# Patient Record
Sex: Female | Born: 1955
Health system: Southern US, Community
[De-identification: ages and names within clinical notes are randomized; demographics above are authoritative.]

## PROBLEM LIST (undated history)

## (undated) DIAGNOSIS — E079 Disorder of thyroid, unspecified: Secondary | ICD-10-CM

## (undated) DIAGNOSIS — F419 Anxiety disorder, unspecified: Secondary | ICD-10-CM

## (undated) DIAGNOSIS — K760 Fatty (change of) liver, not elsewhere classified: Secondary | ICD-10-CM

## (undated) DIAGNOSIS — H35039 Hypertensive retinopathy, unspecified eye: Secondary | ICD-10-CM

## (undated) DIAGNOSIS — F32A Depression, unspecified: Secondary | ICD-10-CM

## (undated) DIAGNOSIS — G4700 Insomnia, unspecified: Secondary | ICD-10-CM

## (undated) DIAGNOSIS — J45909 Unspecified asthma, uncomplicated: Secondary | ICD-10-CM

## (undated) DIAGNOSIS — F329 Major depressive disorder, single episode, unspecified: Secondary | ICD-10-CM

## (undated) DIAGNOSIS — M8580 Other specified disorders of bone density and structure, unspecified site: Secondary | ICD-10-CM

## (undated) DIAGNOSIS — R7303 Prediabetes: Secondary | ICD-10-CM

## (undated) DIAGNOSIS — L309 Dermatitis, unspecified: Secondary | ICD-10-CM

## (undated) DIAGNOSIS — T7840XA Allergy, unspecified, initial encounter: Secondary | ICD-10-CM

## (undated) DIAGNOSIS — I700 Atherosclerosis of aorta: Secondary | ICD-10-CM

## (undated) DIAGNOSIS — E669 Obesity, unspecified: Secondary | ICD-10-CM

## (undated) DIAGNOSIS — M1990 Unspecified osteoarthritis, unspecified site: Secondary | ICD-10-CM

## (undated) DIAGNOSIS — E785 Hyperlipidemia, unspecified: Secondary | ICD-10-CM

## (undated) DIAGNOSIS — N289 Disorder of kidney and ureter, unspecified: Secondary | ICD-10-CM

## (undated) DIAGNOSIS — E559 Vitamin D deficiency, unspecified: Secondary | ICD-10-CM

## (undated) DIAGNOSIS — R011 Cardiac murmur, unspecified: Secondary | ICD-10-CM

## (undated) DIAGNOSIS — K219 Gastro-esophageal reflux disease without esophagitis: Secondary | ICD-10-CM

## (undated) DIAGNOSIS — E039 Hypothyroidism, unspecified: Secondary | ICD-10-CM

## (undated) DIAGNOSIS — H409 Unspecified glaucoma: Secondary | ICD-10-CM

## (undated) DIAGNOSIS — I10 Essential (primary) hypertension: Secondary | ICD-10-CM

## (undated) DIAGNOSIS — J382 Nodules of vocal cords: Secondary | ICD-10-CM

## (undated) DIAGNOSIS — L719 Rosacea, unspecified: Secondary | ICD-10-CM

## (undated) DIAGNOSIS — E0500 Thyrotoxicosis with diffuse goiter without thyrotoxic crisis or storm: Secondary | ICD-10-CM

## (undated) DIAGNOSIS — N644 Mastodynia: Secondary | ICD-10-CM

## (undated) DIAGNOSIS — Z1231 Encounter for screening mammogram for malignant neoplasm of breast: Secondary | ICD-10-CM

## (undated) DIAGNOSIS — Z13220 Encounter for screening for lipoid disorders: Secondary | ICD-10-CM

## (undated) HISTORY — DX: Fatty (change of) liver, not elsewhere classified: K76.0

## (undated) HISTORY — DX: Thyrotoxicosis with diffuse goiter without thyrotoxic crisis or storm: E05.00

## (undated) HISTORY — DX: Essential (primary) hypertension: I10

## (undated) HISTORY — DX: Rosacea, unspecified: L71.9

## (undated) HISTORY — DX: Atherosclerosis of aorta: I70.0

## (undated) HISTORY — DX: Unspecified osteoarthritis, unspecified site: M19.90

## (undated) HISTORY — DX: Anxiety disorder, unspecified: F41.9

## (undated) HISTORY — DX: Allergy, unspecified, initial encounter: T78.40XA

## (undated) HISTORY — DX: Disorder of thyroid, unspecified: E07.9

## (undated) HISTORY — DX: Unspecified glaucoma: H40.9

## (undated) HISTORY — DX: Obesity, unspecified: E66.9

## (undated) HISTORY — DX: Insomnia, unspecified: G47.00

## (undated) HISTORY — DX: Disorder of kidney and ureter, unspecified: N28.9

## (undated) HISTORY — DX: Gastro-esophageal reflux disease without esophagitis: K21.9

## (undated) HISTORY — DX: Hyperlipidemia, unspecified: E78.5

## (undated) HISTORY — DX: Cardiac murmur, unspecified: R01.1

## (undated) HISTORY — DX: Other specified disorders of bone density and structure, unspecified site: M85.80

## (undated) HISTORY — DX: Dermatitis, unspecified: L30.9

## (undated) HISTORY — DX: Vitamin D deficiency, unspecified: E55.9

## (undated) HISTORY — DX: Hypertensive retinopathy, unspecified eye: H35.039

## (undated) HISTORY — DX: Nodules of vocal cords: J38.2

## (undated) HISTORY — DX: Unspecified asthma, uncomplicated: J45.909

## (undated) HISTORY — DX: Major depressive disorder, single episode, unspecified: F32.9

## (undated) HISTORY — DX: Depression, unspecified: F32.A

## (undated) HISTORY — DX: Prediabetes: R73.03

---
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
## 1998-09-29 ENCOUNTER — Other Ambulatory Visit: Admission: RE | Admit: 1998-09-29 | Discharge: 1998-09-29 | Payer: Self-pay | Admitting: *Deleted

## 1999-11-09 ENCOUNTER — Other Ambulatory Visit: Admission: RE | Admit: 1999-11-09 | Discharge: 1999-11-09 | Payer: Self-pay | Admitting: *Deleted

## 1999-11-17 ENCOUNTER — Encounter: Payer: Self-pay | Admitting: *Deleted

## 1999-11-17 ENCOUNTER — Encounter: Admission: RE | Admit: 1999-11-17 | Discharge: 1999-11-17 | Payer: Self-pay | Admitting: *Deleted

## 2001-01-03 ENCOUNTER — Other Ambulatory Visit: Admission: RE | Admit: 2001-01-03 | Discharge: 2001-01-03 | Payer: Self-pay | Admitting: Obstetrics and Gynecology

## 2001-01-05 ENCOUNTER — Encounter: Payer: Self-pay | Admitting: Obstetrics and Gynecology

## 2001-01-05 ENCOUNTER — Encounter: Admission: RE | Admit: 2001-01-05 | Discharge: 2001-01-05 | Payer: Self-pay | Admitting: Obstetrics and Gynecology

## 2002-01-02 ENCOUNTER — Other Ambulatory Visit: Admission: RE | Admit: 2002-01-02 | Discharge: 2002-01-02 | Payer: Self-pay | Admitting: *Deleted

## 2002-01-10 ENCOUNTER — Encounter: Payer: Self-pay | Admitting: *Deleted

## 2002-01-10 ENCOUNTER — Ambulatory Visit (HOSPITAL_COMMUNITY): Admission: RE | Admit: 2002-01-10 | Discharge: 2002-01-10 | Payer: Self-pay | Admitting: *Deleted

## 2003-02-05 ENCOUNTER — Other Ambulatory Visit: Admission: RE | Admit: 2003-02-05 | Discharge: 2003-02-05 | Payer: Self-pay

## 2003-02-12 ENCOUNTER — Ambulatory Visit (HOSPITAL_COMMUNITY): Admission: RE | Admit: 2003-02-12 | Discharge: 2003-02-12 | Payer: Self-pay

## 2003-08-26 ENCOUNTER — Encounter: Admission: RE | Admit: 2003-08-26 | Discharge: 2003-08-26 | Payer: Self-pay

## 2003-11-12 ENCOUNTER — Ambulatory Visit (HOSPITAL_COMMUNITY): Admission: RE | Admit: 2003-11-12 | Discharge: 2003-11-12 | Payer: Self-pay | Admitting: Family Medicine

## 2004-02-05 ENCOUNTER — Other Ambulatory Visit: Admission: RE | Admit: 2004-02-05 | Discharge: 2004-02-05 | Payer: Self-pay | Admitting: Family Medicine

## 2004-02-17 ENCOUNTER — Ambulatory Visit (HOSPITAL_COMMUNITY): Admission: RE | Admit: 2004-02-17 | Discharge: 2004-02-17 | Payer: Self-pay | Admitting: Family Medicine

## 2005-02-23 ENCOUNTER — Ambulatory Visit (HOSPITAL_COMMUNITY): Admission: RE | Admit: 2005-02-23 | Discharge: 2005-02-23 | Payer: Self-pay | Admitting: Family Medicine

## 2005-03-07 ENCOUNTER — Other Ambulatory Visit: Admission: RE | Admit: 2005-03-07 | Discharge: 2005-03-07 | Payer: Self-pay | Admitting: Family Medicine

## 2006-03-01 ENCOUNTER — Ambulatory Visit (HOSPITAL_COMMUNITY): Admission: RE | Admit: 2006-03-01 | Discharge: 2006-03-01 | Payer: Self-pay | Admitting: Family Medicine

## 2006-03-24 ENCOUNTER — Encounter: Admission: RE | Admit: 2006-03-24 | Discharge: 2006-03-24 | Payer: Self-pay | Admitting: Family Medicine

## 2006-07-26 ENCOUNTER — Ambulatory Visit: Payer: Self-pay | Admitting: Internal Medicine

## 2006-08-02 ENCOUNTER — Ambulatory Visit: Payer: Self-pay | Admitting: Internal Medicine

## 2007-03-07 ENCOUNTER — Ambulatory Visit (HOSPITAL_COMMUNITY): Admission: RE | Admit: 2007-03-07 | Discharge: 2007-03-07 | Payer: Self-pay | Admitting: Family Medicine

## 2008-03-17 ENCOUNTER — Ambulatory Visit (HOSPITAL_COMMUNITY): Admission: RE | Admit: 2008-03-17 | Discharge: 2008-03-17 | Payer: Self-pay | Admitting: *Deleted

## 2008-03-25 ENCOUNTER — Ambulatory Visit: Payer: Self-pay | Admitting: Internal Medicine

## 2008-04-22 ENCOUNTER — Ambulatory Visit: Payer: Self-pay | Admitting: Internal Medicine

## 2009-03-19 ENCOUNTER — Ambulatory Visit (HOSPITAL_COMMUNITY): Admission: RE | Admit: 2009-03-19 | Discharge: 2009-03-19 | Payer: Self-pay | Admitting: Family Medicine

## 2009-05-07 ENCOUNTER — Encounter: Admission: RE | Admit: 2009-05-07 | Discharge: 2009-08-05 | Payer: Self-pay | Admitting: Family Medicine

## 2009-08-05 ENCOUNTER — Encounter: Admission: RE | Admit: 2009-08-05 | Discharge: 2009-09-30 | Payer: Self-pay | Admitting: Family Medicine

## 2010-03-31 ENCOUNTER — Ambulatory Visit (HOSPITAL_COMMUNITY): Admission: RE | Admit: 2010-03-31 | Discharge: 2010-03-31 | Payer: Self-pay | Admitting: Family Medicine

## 2011-03-18 NOTE — Assessment & Plan Note (Signed)
[facility]                           GASTROENTEROLOGY OFFICE NOTE   Morgan Williams, Morgan Williams                       MRN:          [id number]  DATE:07/26/2006                            DOB:          Oct 15, 1956    REFERRING PHYSICIAN:  Talmadge Williams, M.D.   REASON FOR CONSULTATION:  Reflux disease.   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 Smith Williams regarding reflux disease.  Patient reports approximately two years ago developing problems with  hoarseness.  This in the absence of heartburn or indigestion.  She was  evaluated by Dr. Zola Button T. Wolicki (ear, nose and throat specialist).  According to the patient she underwent laryngoscopy and a local CT scan.  She was apparently found to have benign vocal cord nodules which were felt  secondary to reflux disease.  She was prescribed Prilosec 20 mg b.i.d. and  her hoarseness resolved over the course of 3-4 months.  She has continued on  medication at that dosage.  She has had no change in lifestyle or weight.  However, about 3 months ago, her hoarseness returned.  At this time, there  was associated pyrosis which was particularly bothersome at night.  She does  return home from her shift in the evening and generally eats late at night  prior to going to bed.  She treated herself by increasing her evening  Prilosec dose to 40 mg.  As well, she was taking antacids regularly.  Despite this, symptoms persisted.  She subsequently saw Dr. Smith Williams and  was changed to Nexium 40 mg b.i.d.  This appointment made.  She has been on  Nexium 40 mg b.i.d. for about one month.  Her symptoms are markedly  improved. She has rare episodes of pyrosis.  Her hoarseness is nearly  resolved.   REVIEW OF SYSTEMS:  Some intermittent solid food dysphagia which is a new  problem over the past several months.  She will experience symptoms several  times per  week.  No difficulty with liquids.  Occasional gas and bloating.  No abdominal pain, change in bowel habits or rectal bleeding. She does  undergo annual Hemoccult testing which has been negative.   PAST MEDICAL HISTORY:  1. Hypertension.  2. Asthma.  3. Hypothyroidism.  4. Osteoarthritis.  5. Reflux disease as described above.   PAST SURGICAL HISTORY:  Thyroidectomy.   ALLERGIES:  SULFA.   CURRENT MEDICATIONS:  1. Nexium 40 mg b.i.d.  2. Levothyroxine 88 mcg daily.  3. Hydrochlorothiazide 12.5 mg daily.  4. Celebrex 200 mg b.i.d.  5. Benicar 20/12.5 mg daily.  6. She also uses p.r.n. medications including Advair, Zyrtec, Ambien,      albuterol, MetroLotion and triamcinolone cream, Tussionex and Xanax.   FAMILY HISTORY:  Father with colon polyps.  Mother with ulcerative colitis.   SOCIAL HISTORY:  Patient is divorced with two children.  She lives with her  daughter.  She has a BS in pharmacy and works as a Teacher, early years/pre for [employer].  She  does not smoke and rarely uses  alcohol.   REVIEW OF SYSTEMS:  Per diagnostic evaluation form.   PHYSICAL EXAMINATION:  GENERAL APPEARANCE:  A well-appearing female in no  acute distress.  VITAL SIGNS:  Blood pressure 122/62, heart rate 62 and regular, weight 210.6  pounds.  She is 5 feet 3 inches in height.  HEENT:  Sclerae are anicteric.  Conjunctivae are pink.  Oral mucosa intact.  Posterior pharynx is unremarkable.  There is no adenopathy.  LUNGS:  Clear.  CARDIOVASCULAR:  Regular.  ABDOMEN:  Soft without tenderness, mass or hernia.  Good bowel sounds heard.  EXTREMITIES:  Without edema.   IMPRESSION:  Gastroesophageal reflux disease with laryngeal and esophageal  symptoms.  As well, there are some problems with dysphagia.  Rule out peptic  stricture.  Rule out Barrett's esophagus.   RECOMMENDATIONS:  1. Continue Nexium 40 mg p.o. b.i.d.  2. Reflux precautions.  3. Schedule upper endoscopy with possible esophageal dilatation.  Ashby Dawes       of the procedure as well as risks, benefits, and alternatives have been      reviewed.  She understood and agreed to proceed.  4. Ongoing general medical care with Dr. Smith Williams.            ______________________________  Wilhemina Bonito. Eda Keys., MD      JNP/MedQ  DD:  07/26/2006  DT:  07/28/2006  Job #:  [id number]

## 2011-09-07 ENCOUNTER — Other Ambulatory Visit (HOSPITAL_COMMUNITY): Payer: Self-pay | Admitting: Family Medicine

## 2011-09-07 DIAGNOSIS — Z1231 Encounter for screening mammogram for malignant neoplasm of breast: Secondary | ICD-10-CM

## 2011-10-05 ENCOUNTER — Ambulatory Visit (HOSPITAL_COMMUNITY)
Admission: RE | Admit: 2011-10-05 | Discharge: 2011-10-05 | Disposition: A | Discharge status: Home / Self Care | Payer: BC Managed Care – PPO | Source: Ambulatory Visit | Attending: Family Medicine | Admitting: Family Medicine

## 2011-10-05 DIAGNOSIS — Z1231 Encounter for screening mammogram for malignant neoplasm of breast: Secondary | ICD-10-CM | POA: Insufficient documentation

## 2012-10-26 ENCOUNTER — Other Ambulatory Visit (HOSPITAL_COMMUNITY): Payer: Self-pay | Admitting: Family Medicine

## 2012-10-26 DIAGNOSIS — Z1231 Encounter for screening mammogram for malignant neoplasm of breast: Secondary | ICD-10-CM

## 2012-11-06 ENCOUNTER — Other Ambulatory Visit (HOSPITAL_COMMUNITY)
Admission: RE | Admit: 2012-11-06 | Discharge: 2012-11-06 | Disposition: A | Discharge status: Home / Self Care | Payer: BC Managed Care – PPO | Source: Ambulatory Visit | Attending: Family Medicine | Admitting: Family Medicine

## 2012-11-06 ENCOUNTER — Other Ambulatory Visit: Payer: Self-pay | Admitting: Family Medicine

## 2012-11-06 DIAGNOSIS — Z124 Encounter for screening for malignant neoplasm of cervix: Secondary | ICD-10-CM | POA: Insufficient documentation

## 2012-11-13 ENCOUNTER — Ambulatory Visit (HOSPITAL_COMMUNITY)
Admission: RE | Admit: 2012-11-13 | Discharge: 2012-11-13 | Disposition: A | Discharge status: Home / Self Care | Payer: BC Managed Care – PPO | Source: Ambulatory Visit | Attending: Family Medicine | Admitting: Family Medicine

## 2012-11-13 DIAGNOSIS — Z1231 Encounter for screening mammogram for malignant neoplasm of breast: Secondary | ICD-10-CM | POA: Insufficient documentation

## 2013-11-08 ENCOUNTER — Other Ambulatory Visit (HOSPITAL_COMMUNITY): Payer: Self-pay | Admitting: Family Medicine

## 2013-11-08 DIAGNOSIS — Z1231 Encounter for screening mammogram for malignant neoplasm of breast: Secondary | ICD-10-CM

## 2013-11-20 ENCOUNTER — Ambulatory Visit (HOSPITAL_COMMUNITY): Payer: BC Managed Care – PPO

## 2013-11-21 ENCOUNTER — Ambulatory Visit (HOSPITAL_COMMUNITY)
Admission: RE | Admit: 2013-11-21 | Discharge: 2013-11-21 | Disposition: A | Discharge status: Home / Self Care | Payer: BC Managed Care – PPO | Source: Ambulatory Visit | Attending: Family Medicine | Admitting: Family Medicine

## 2013-11-21 DIAGNOSIS — Z1231 Encounter for screening mammogram for malignant neoplasm of breast: Secondary | ICD-10-CM | POA: Insufficient documentation

## 2014-11-17 ENCOUNTER — Other Ambulatory Visit (HOSPITAL_COMMUNITY): Payer: Self-pay | Admitting: Family Medicine

## 2014-11-17 DIAGNOSIS — Z1231 Encounter for screening mammogram for malignant neoplasm of breast: Secondary | ICD-10-CM

## 2014-11-24 ENCOUNTER — Ambulatory Visit (HOSPITAL_COMMUNITY)
Admission: RE | Admit: 2014-11-24 | Discharge: 2014-11-24 | Disposition: A | Discharge status: Home / Self Care | Payer: 59 | Source: Ambulatory Visit | Attending: Family Medicine | Admitting: Family Medicine

## 2014-11-24 DIAGNOSIS — Z1231 Encounter for screening mammogram for malignant neoplasm of breast: Secondary | ICD-10-CM | POA: Diagnosis not present

## 2015-05-12 ENCOUNTER — Ambulatory Visit (INDEPENDENT_AMBULATORY_CARE_PROVIDER_SITE_OTHER): Payer: 59 | Admitting: Physician Assistant

## 2015-05-12 VITALS — BP 130/80 | HR 85 | Temp 98.4°F | Resp 16 | Ht 63.0 in | Wt 224.4 lb

## 2015-05-12 DIAGNOSIS — S90851A Superficial foreign body, right foot, initial encounter: Secondary | ICD-10-CM | POA: Diagnosis not present

## 2015-05-12 DIAGNOSIS — S90859A Superficial foreign body, unspecified foot, initial encounter: Secondary | ICD-10-CM

## 2015-05-12 MED ORDER — MUPIROCIN 2 % EX OINT: 1.0000 "application " | TOPICAL_OINTMENT | Freq: Two times a day (BID) | CUTANEOUS | Status: DC

## 2015-05-12 NOTE — Progress Notes (Signed)
   Subjective:    Patient ID: Morgan Williams, female    DOB: Sep 14, 1956, 59 y.o.   MRN: [id number]  HPI Patient presents with glass in right foot following stepping on shattered light 3 days ago. Wash foot well, however, has felt like something is in her foot since then. Patient's son has tried to get glass out unsuccessfully. Denies pain, but is uncomfortable. Denies fever, drainage, and numbness. Has tried putting cold tar on foot to get glass out. Med allergy to sulfa antibiotics.    Review of Systems  Constitutional: Negative for fever and chills.  Musculoskeletal: Positive for gait problem.  Skin: Positive for wound. Negative for color change.  Neurological: Negative for numbness.       Objective:   Physical Exam  Constitutional: She is oriented to person, place, and time. She appears well-developed and well-nourished. No distress.  Blood pressure 130/80, pulse 85, temperature 98.4 F (36.9 C), temperature source Oral, resp. rate 16, height 5\' 3"  (1.6 m), weight 224 lb 6.4 oz (101.787 kg), SpO2 96 %.   HENT:  Head: Normocephalic and atraumatic.  Right Ear: External ear normal.  Left Ear: External ear normal.  Eyes: Conjunctivae are normal. Right eye exhibits no discharge. Left eye exhibits no discharge. No scleral icterus.  Pulmonary/Chest: Effort normal.  Neurological: She is alert and oriented to person, place, and time.  Skin: Skin is warm and dry. No rash noted. She is not diaphoretic. No erythema. No pallor.  Psychiatric: She has a normal mood and affect. Her behavior is normal. Judgment and thought content normal.   Procedure Consent obtained. 1/2 cc 1% lido local anesthesia. Trimmed back dead skin. Small incision made. Glass removed. Wound irrigated. Mupirocin gel and clean dressing placed.      Assessment & Plan:  1. Foreign body in foot, unspecified laterality, initial encounter Care instructions discussed. - mupirocin ointment (BACTROBAN) 2 %; Apply 1 application  topically 2 (two) times daily.  Dispense: 15 g; Refill: 1   Damilola Flamm PA-C  [facility] 05/12/2015 5:48 PM

## 2015-05-25 ENCOUNTER — Encounter: Payer: Self-pay | Admitting: Internal Medicine

## 2015-11-23 ENCOUNTER — Other Ambulatory Visit (HOSPITAL_COMMUNITY)
Admission: RE | Admit: 2015-11-23 | Discharge: 2015-11-23 | Disposition: A | Discharge status: Home / Self Care | Payer: 59 | Source: Ambulatory Visit | Attending: Family Medicine | Admitting: Family Medicine

## 2015-11-23 ENCOUNTER — Other Ambulatory Visit: Payer: Self-pay | Admitting: Family Medicine

## 2015-11-23 ENCOUNTER — Other Ambulatory Visit: Payer: Self-pay

## 2015-11-23 DIAGNOSIS — E559 Vitamin D deficiency, unspecified: Secondary | ICD-10-CM | POA: Diagnosis not present

## 2015-11-23 DIAGNOSIS — E039 Hypothyroidism, unspecified: Secondary | ICD-10-CM | POA: Diagnosis not present

## 2015-11-23 DIAGNOSIS — Z124 Encounter for screening for malignant neoplasm of cervix: Secondary | ICD-10-CM | POA: Insufficient documentation

## 2015-11-23 DIAGNOSIS — R7301 Impaired fasting glucose: Secondary | ICD-10-CM | POA: Diagnosis not present

## 2015-11-23 DIAGNOSIS — I10 Essential (primary) hypertension: Secondary | ICD-10-CM | POA: Diagnosis not present

## 2015-11-23 DIAGNOSIS — E785 Hyperlipidemia, unspecified: Secondary | ICD-10-CM | POA: Diagnosis not present

## 2015-11-23 DIAGNOSIS — Z1231 Encounter for screening mammogram for malignant neoplasm of breast: Secondary | ICD-10-CM

## 2015-11-23 DIAGNOSIS — Z1159 Encounter for screening for other viral diseases: Secondary | ICD-10-CM | POA: Diagnosis not present

## 2015-11-23 DIAGNOSIS — J45909 Unspecified asthma, uncomplicated: Secondary | ICD-10-CM | POA: Diagnosis not present

## 2015-11-23 DIAGNOSIS — Z0000 Encounter for general adult medical examination without abnormal findings: Secondary | ICD-10-CM | POA: Diagnosis not present

## 2015-11-23 DIAGNOSIS — B1920 Unspecified viral hepatitis C without hepatic coma: Secondary | ICD-10-CM | POA: Diagnosis not present

## 2015-11-24 LAB — CYTOLOGY - PAP

## 2015-11-26 MED FILL — LEVOTHYROXINE 88 MCG TABLET: 88 | 90 days supply | Qty: 90 | Fill #0

## 2015-11-26 MED FILL — traZODone HCL 50 MG TABS: 50 | 90 days supply | Qty: 180 | Fill #0

## 2015-11-26 MED FILL — OLOPATADINE HCL 0.1% EYE DR: 0.1 | 30 days supply | Qty: 5 | Fill #1

## 2015-11-26 MED FILL — metroNIDAZOLE 0.75 % LOTN: 0.75 | 30 days supply | Qty: 59 | Fill #2

## 2015-11-26 MED FILL — LOSARTAN POTASSIUM 50 MG TA: 50 | 90 days supply | Qty: 90 | Fill #0

## 2015-11-26 MED FILL — HYDROCHLOROTHIAZIDE 12.5 MG: 12.5 | 90 days supply | Qty: 90 | Fill #0

## 2015-11-26 MED FILL — MELOXICAM 15 MG TABLET: 15 | 90 days supply | Qty: 90 | Fill #0

## 2015-11-26 MED FILL — SIMVASTATIN 40 MG TABLET: 40 | 90 days supply | Qty: 90 | Fill #0

## 2015-12-01 ENCOUNTER — Ambulatory Visit
Admission: RE | Admit: 2015-12-01 | Discharge: 2015-12-01 | Disposition: A | Discharge status: Home / Self Care | Payer: 59 | Source: Ambulatory Visit

## 2015-12-01 DIAGNOSIS — Z1231 Encounter for screening mammogram for malignant neoplasm of breast: Secondary | ICD-10-CM | POA: Diagnosis not present

## 2015-12-03 DIAGNOSIS — L821 Other seborrheic keratosis: Secondary | ICD-10-CM | POA: Diagnosis not present

## 2015-12-03 DIAGNOSIS — L570 Actinic keratosis: Secondary | ICD-10-CM | POA: Diagnosis not present

## 2015-12-03 DIAGNOSIS — L718 Other rosacea: Secondary | ICD-10-CM | POA: Diagnosis not present

## 2015-12-05 ENCOUNTER — Encounter: Payer: 59 | Attending: Family Medicine | Admitting: Dietician

## 2015-12-05 DIAGNOSIS — Z713 Dietary counseling and surveillance: Secondary | ICD-10-CM | POA: Diagnosis not present

## 2015-12-05 NOTE — Progress Notes (Signed)
Patient was seen on 12/05/15 for the Weight Loss Class at the [facility]. The following learning objectives were met by the patient during this class:   Describe healthy choices in each food group  Describe portion size of foods  Use plate method for meal planning  Demonstrate how to read Nutrition Facts food label  Set realistic goals for weight loss, diet changes, and physical activity.   Goals:  1. Make healthy food choices in each food group.  2. Reduce portion size of foods.  3. Increase fruit and vegetable intake.  4. Use plate method for meal planning.  5. Increase physical activity.    Handouts given:   1. Nutrition Strategies for Weight Loss   2. Meal plan/portion card   3. MyPlate Planner   4. Weight Management Recipe Resources   5. Bake, Broil, Grill   

## 2015-12-21 ENCOUNTER — Telehealth: Payer: 59 | Admitting: Family

## 2015-12-21 DIAGNOSIS — R197 Diarrhea, unspecified: Secondary | ICD-10-CM

## 2015-12-21 MED ORDER — AZITHROMYCIN 500 MG PO TABS: 500.0000 mg | ORAL_TABLET | Freq: Every day | ORAL | Status: DC

## 2015-12-21 MED FILL — AZITHROMYCIN 500 MG TABLET: 500 | 3 days supply | Qty: 3 | Fill #0

## 2015-12-21 NOTE — Progress Notes (Signed)
We are sorry that you are not feeling well.  Here is how we plan to help!  Based on what you have shared with me it looks like you have Acute Infectious Diarrhea.  Most cases of acute diarrhea are due to infections with virus and bacteria and are self-limited conditions lasting less than 14 days.  For your symptoms you may take Imodium 2 mg tablets that are over the counter at your local pharmacy. Take two tablet now and then one after each loose stool up to 6 a day.  Antibiotics are not needed for most people with diarrhea.    Optional: I have sent in azithromycin 500 mg daily for 3 days.  HOME CARE  We recommend changing your diet to help with your symptoms for the next few days.  Drink plenty of fluids that contain water salt and sugar. Sports drinks such as Gatorade may help.   You may try broths, soups, bananas, applesauce, soft breads, mashed potatoes or crackers.   You are considered infectious for as long as the diarrhea continues. Hand washing or use of alcohol based hand sanitizers is recommend.  It is best to stay out of work or school until your symptoms stop.   GET HELP RIGHT AWAY  If you have dark yellow colored urine or do not pass urine frequently you should drink more fluids.    If your symptoms worsen   If you feel like you are going to pass out (faint)  You have a new problem  MAKE SURE YOU   Understand these instructions.  Will watch your condition.  Will get help right away if you are not doing well or get worse.  Your e-visit answers were reviewed by a board certified advanced clinical practitioner to complete your personal care plan.  Depending on the condition, your plan could have included both over the counter or prescription medications.  If there is a problem please reply  once you have received a response from your provider.  Your safety is important to us.  If you have drug allergies check your prescription carefully.    You can use  MyChart to ask questions about today's visit, request a non-urgent call back, or ask for a work or school excuse for 24 hours related to this e-Visit. If it has been greater than 24 hours you will need to follow up with your provider, or enter a new e-Visit to address those concerns.   You will get an e-mail in the next two days asking about your experience.  I hope that your e-visit has been valuable and will speed your recovery. Thank you for using e-visits.   

## 2016-01-04 DIAGNOSIS — M8589 Other specified disorders of bone density and structure, multiple sites: Secondary | ICD-10-CM | POA: Diagnosis not present

## 2016-01-04 DIAGNOSIS — M859 Disorder of bone density and structure, unspecified: Secondary | ICD-10-CM | POA: Diagnosis not present

## 2016-01-08 MED FILL — PANTOPRAZOLE SOD DR 40 MG T: 40 | 90 days supply | Qty: 90 | Fill #0

## 2016-02-23 MED FILL — HYDROCHLOROTHIAZIDE 12.5 MG: 12.5 | 90 days supply | Qty: 90 | Fill #0

## 2016-02-23 MED FILL — SIMVASTATIN 40 MG TABLET: 40 | 90 days supply | Qty: 90 | Fill #0

## 2016-02-23 MED FILL — metroNIDAZOLE 0.75 % LOTN: 0.75 | 30 days supply | Qty: 59 | Fill #3

## 2016-02-23 MED FILL — LEVOTHYROXINE 88 MCG TABLET: 88 | 90 days supply | Qty: 90 | Fill #1

## 2016-02-23 MED FILL — LOSARTAN POTASSIUM 50 MG TA: 50 | 90 days supply | Qty: 90 | Fill #0

## 2016-02-29 DIAGNOSIS — H2513 Age-related nuclear cataract, bilateral: Secondary | ICD-10-CM | POA: Diagnosis not present

## 2016-02-29 DIAGNOSIS — H353130 Nonexudative age-related macular degeneration, bilateral, stage unspecified: Secondary | ICD-10-CM | POA: Diagnosis not present

## 2016-02-29 DIAGNOSIS — H524 Presbyopia: Secondary | ICD-10-CM | POA: Diagnosis not present

## 2016-02-29 DIAGNOSIS — H40023 Open angle with borderline findings, high risk, bilateral: Secondary | ICD-10-CM | POA: Diagnosis not present

## 2016-02-29 DIAGNOSIS — E119 Type 2 diabetes mellitus without complications: Secondary | ICD-10-CM | POA: Diagnosis not present

## 2016-02-29 MED FILL — OLOPATADINE HCL 0.1% EYE DR: 0.1 | 25 days supply | Qty: 5 | Fill #0

## 2016-04-12 MED FILL — OLOPATADINE HCL 0.1% EYE DR: 0.1 | 25 days supply | Qty: 5 | Fill #1

## 2016-04-12 MED FILL — metroNIDAZOLE 0.75 % LOTN: 0.75 | 30 days supply | Qty: 59 | Fill #4

## 2016-04-12 MED FILL — PANTOPRAZOLE SOD DR 40 MG T: 40 | 90 days supply | Qty: 90 | Fill #1

## 2016-05-31 MED FILL — LEVOTHYROXINE 88 MCG TABLET: 88 | 90 days supply | Qty: 90 | Fill #0

## 2016-05-31 MED FILL — SIMVASTATIN 40 MG TABLET: 40 | 90 days supply | Qty: 90 | Fill #0

## 2016-05-31 MED FILL — traZODone HCL 50 MG TABS: 50 | 90 days supply | Qty: 180 | Fill #0

## 2016-05-31 MED FILL — HYDROCHLOROTHIAZIDE 12.5 MG: 12.5 | 90 days supply | Qty: 90 | Fill #1

## 2016-05-31 MED FILL — LOSARTAN POTASSIUM 50 MG TA: 50 | 90 days supply | Qty: 90 | Fill #0

## 2016-07-05 MED FILL — PANTOPRAZOLE SOD DR 40 MG T: 40 | 90 days supply | Qty: 90 | Fill #0

## 2016-07-27 MED FILL — metroNIDAZOLE 0.75 % LOTN: 0.75 | 30 days supply | Qty: 59 | Fill #0

## 2016-08-09 MED FILL — OLOPATADINE HCL 0.1% EYE DR: 0.1 | 25 days supply | Qty: 5 | Fill #2

## 2016-08-25 MED FILL — metroNIDAZOLE 0.75 % LOTN: 0.75 | 30 days supply | Qty: 59 | Fill #1

## 2016-08-25 MED FILL — LOSARTAN POTASSIUM 50 MG TA: 50 | 90 days supply | Qty: 90 | Fill #1

## 2016-08-25 MED FILL — SIMVASTATIN 40 MG TABLET: 40 | 90 days supply | Qty: 90 | Fill #1

## 2016-08-25 MED FILL — LEVOTHYROXINE 88 MCG TABLET: 88 | 90 days supply | Qty: 90 | Fill #1

## 2016-08-25 MED FILL — MELOXICAM 15 MG TABLET: 15 | 90 days supply | Qty: 90 | Fill #0

## 2016-08-25 MED FILL — traZODone HCL 50 MG TABS: 50 | 90 days supply | Qty: 180 | Fill #1

## 2016-08-25 MED FILL — HYDROCHLOROTHIAZIDE 12.5 MG: 12.5 | 90 days supply | Qty: 90 | Fill #0

## 2016-09-07 MED FILL — OLOPATADINE HCL 0.1% EYE DR: 0.1 | 25 days supply | Qty: 5 | Fill #3

## 2016-09-27 MED FILL — OLOPATADINE HCL 0.1% EYE DR: 0.1 | 25 days supply | Qty: 5 | Fill #4

## 2016-09-27 MED FILL — PANTOPRAZOLE SOD DR 40 MG T: 40 | 90 days supply | Qty: 90 | Fill #1

## 2016-09-27 MED FILL — metroNIDAZOLE 0.75 % LOTN: 0.75 | 30 days supply | Qty: 59 | Fill #2

## 2016-10-20 MED FILL — VENTOLIN HFA 90 MCG INHALER: 108 (90 BAS | 35 days supply | Qty: 36 | Fill #0

## 2016-10-22 ENCOUNTER — Telehealth: Payer: 59 | Admitting: Nurse Practitioner

## 2016-10-22 DIAGNOSIS — R059 Cough, unspecified: Secondary | ICD-10-CM

## 2016-10-22 DIAGNOSIS — R05 Cough: Secondary | ICD-10-CM

## 2016-10-22 MED ORDER — AZITHROMYCIN 500 MG PO TABS: 500.0000 mg | ORAL_TABLET | Freq: Every day | ORAL | 0 refills | Status: DC

## 2016-10-22 MED ORDER — BENZONATATE 100 MG PO CAPS: 100.0000 mg | ORAL_CAPSULE | Freq: Three times a day (TID) | ORAL | 0 refills | Status: DC | PRN

## 2016-10-22 NOTE — Progress Notes (Signed)

## 2016-10-25 MED FILL — metroNIDAZOLE 0.75 % LOTN: 0.75 | 90 days supply | Qty: 177 | Fill #3

## 2016-10-25 MED FILL — OLOPATADINE HCL 0.1% EYE DR: 0.1 | 75 days supply | Qty: 15 | Fill #5

## 2016-10-29 ENCOUNTER — Ambulatory Visit (INDEPENDENT_AMBULATORY_CARE_PROVIDER_SITE_OTHER): Payer: 59 | Admitting: Physician Assistant

## 2016-10-29 VITALS — BP 138/76 | HR 99 | Temp 101.0°F | Resp 16 | Ht 63.0 in | Wt 219.0 lb

## 2016-10-29 DIAGNOSIS — R062 Wheezing: Secondary | ICD-10-CM | POA: Diagnosis not present

## 2016-10-29 DIAGNOSIS — J45909 Unspecified asthma, uncomplicated: Secondary | ICD-10-CM | POA: Diagnosis not present

## 2016-10-29 DIAGNOSIS — R05 Cough: Secondary | ICD-10-CM | POA: Diagnosis not present

## 2016-10-29 DIAGNOSIS — R059 Cough, unspecified: Secondary | ICD-10-CM

## 2016-10-29 MED ORDER — PREDNISONE 20 MG PO TABS: ORAL_TABLET | ORAL | 0 refills | Status: DC

## 2016-10-29 MED ORDER — ALBUTEROL SULFATE (2.5 MG/3ML) 0.083% IN NEBU
2.5000 mg | INHALATION_SOLUTION | Freq: Once | RESPIRATORY_TRACT | Status: AC
  Administered 2016-10-29: 2.5 mg via RESPIRATORY_TRACT

## 2016-10-29 MED ORDER — IPRATROPIUM BROMIDE 0.02 % IN SOLN
0.5000 mg | Freq: Once | RESPIRATORY_TRACT | Status: AC
  Administered 2016-10-29: 0.5 mg via RESPIRATORY_TRACT

## 2016-10-29 NOTE — Patient Instructions (Addendum)
Please stay well hydrated. Drink at least 2-3 liters of water a day. Continue using your humidifier.  Come back after steroid course if you are not better.   Thank you for coming in today. I hope you feel we met your needs.  Feel free to call [facility] if you have any questions or further requests.  Please consider signing up for MyChart if you do not already have it, as this is a great way to communicate with me.  Best,  Whitney McVey, PA-C    IF you received an x-ray today, you will receive an invoice from [facility]. Please contact [facility] at [phone] with questions or concerns regarding your invoice.   IF you received labwork today, you will receive an invoice from [facility]. Please contact [facility] at [phone] with questions or concerns regarding your invoice.   Our billing staff will not be able to assist you with questions regarding bills from these companies.  You will be contacted with the lab results as soon as they are available. The fastest way to get your results is to activate your My Chart account. Instructions are located on the last page of this paperwork. If you have not heard from [name] regarding the results in 2 weeks, please contact this office.

## 2016-10-29 NOTE — Progress Notes (Signed)
Morgan Williams  MRN: [id number] DOB: 23-Dec-1955  PCP: Gerrit Heck, MD  Subjective:  Pt is a 60 year old presents to clinic for cough x 10 days.  She did an e-visit one week ago, was prescribed Z-pack. Did not feel any better after treatment. +cough, wheezing, chest tightness. All getting worse.  History of asthma - usually well controlled. She has been using her Albuterol about 6 times a day.  Denies chest pain, fever, nausea, vomiting, abdominal pain, nasal congestion, headache, chills.   Review of Systems  Constitutional: Negative for chills, diaphoresis, fatigue and fever.  HENT: Negative for congestion, postnasal drip, rhinorrhea, sinus pressure, sneezing and sore throat.   Respiratory: Positive for cough and chest tightness. Negative for shortness of breath and wheezing.   Cardiovascular: Negative for chest pain and palpitations.  Gastrointestinal: Negative for abdominal pain, diarrhea, nausea and vomiting.  Neurological: Negative for weakness, light-headedness and headaches.    There are no active problems to display for this patient.   Current Outpatient Prescriptions on File Prior to Visit  Medication Sig Dispense Refill  . fexofenadine (ALLEGRA) 180 MG tablet Take 180 mg by mouth daily.    . hydrochlorothiazide (MICROZIDE) 12.5 MG capsule Take 12.5 mg by mouth daily.    Marland Kitchen levothyroxine (SYNTHROID, LEVOTHROID) 88 MCG tablet Take 88 mcg by mouth daily before breakfast.    . losartan (COZAAR) 50 MG tablet Take 50 mg by mouth daily.    . meloxicam (MOBIC) 15 MG tablet Take 15 mg by mouth daily.    . mupirocin ointment (BACTROBAN) 2 % Apply 1 application topically 2 (two) times daily. 15 g 1  . pantoprazole (PROTONIX) 40 MG tablet Take 40 mg by mouth daily.    . simvastatin (ZOCOR) 40 MG tablet Take 40 mg by mouth daily.    . traZODone (DESYREL) 50 MG tablet Take 50 mg by mouth at bedtime.    Marland Kitchen azithromycin (ZITHROMAX) 500 MG tablet Take 1 tablet (500 mg total) by  mouth daily. (Patient not taking: Reported on 10/29/2016) 3 tablet 0  . benzonatate (TESSALON PERLES) 100 MG capsule Take 1 capsule (100 mg total) by mouth 3 (three) times daily as needed for cough. (Patient not taking: Reported on 10/29/2016) 20 capsule 0   No current facility-administered medications on file prior to visit.     Allergies  Allergen Reactions  . Sulfa Antibiotics Rash     Objective:  BP 138/76   Pulse 99   Temp (!) 101 F (38.3 C) (Oral)   Resp 16   Ht 5\' 3"  (1.6 m)   Wt 219 lb (99.3 kg)   SpO2 95%   BMI 38.79 kg/m   Physical Exam  Constitutional: She is oriented to person, place, and time and well-developed, well-nourished, and in no distress. No distress.  Cardiovascular: Normal rate, regular rhythm and normal heart sounds.   Pulmonary/Chest: She has wheezes in the right upper field, the right middle field, the right lower field, the left upper field, the left middle field and the left lower field.  Neurological: She is alert and oriented to person, place, and time. GCS score is 15.  Skin: Skin is warm and dry.  Psychiatric: Mood, memory, affect and judgment normal.  Vitals reviewed.  Assessment and Plan :  1. Wheezing 2. Cough 3. Uncomplicated asthma, unspecified asthma severity, unspecified whether persistent - albuterol (PROVENTIL) (2.5 MG/3ML) 0.083% nebulizer solution 2.5 mg; Take 3 mLs (2.5 mg total) by nebulization once. - ipratropium (ATROVENT)  nebulizer solution 0.5 mg; Take 2.5 mLs (0.5 mg total) by nebulization once. - predniSONE (DELTASONE) 20 MG tablet; Take 3 PO QAM x3days, 2 PO QAM x3days, 1 PO QAM x3days  Dispense: 18 tablet; Refill: 0 - Pt reports felling "99% better" after breathing treatment. Mild wheezing still present throughout lung fields, which is significant improvement from initial encounter. Will treat with PO steroid taper. RTC after treatment if no improvement.   Mercer Pod, PA-C  [facility] 10/29/2016 11:42 AM

## 2016-11-21 ENCOUNTER — Other Ambulatory Visit: Payer: Self-pay | Admitting: Family Medicine

## 2016-11-21 DIAGNOSIS — Z1231 Encounter for screening mammogram for malignant neoplasm of breast: Secondary | ICD-10-CM

## 2016-11-21 DIAGNOSIS — E559 Vitamin D deficiency, unspecified: Secondary | ICD-10-CM | POA: Diagnosis not present

## 2016-11-21 DIAGNOSIS — R7301 Impaired fasting glucose: Secondary | ICD-10-CM | POA: Diagnosis not present

## 2016-11-21 DIAGNOSIS — E785 Hyperlipidemia, unspecified: Secondary | ICD-10-CM | POA: Diagnosis not present

## 2016-11-21 DIAGNOSIS — E039 Hypothyroidism, unspecified: Secondary | ICD-10-CM | POA: Diagnosis not present

## 2016-11-21 DIAGNOSIS — I10 Essential (primary) hypertension: Secondary | ICD-10-CM | POA: Diagnosis not present

## 2016-11-24 MED FILL — VIT D2 1.25 MG (50,000 UNIT: 1.25 MG | 56 days supply | Qty: 8 | Fill #0

## 2016-11-28 DIAGNOSIS — Z0000 Encounter for general adult medical examination without abnormal findings: Secondary | ICD-10-CM | POA: Diagnosis not present

## 2016-11-28 DIAGNOSIS — E785 Hyperlipidemia, unspecified: Secondary | ICD-10-CM | POA: Diagnosis not present

## 2016-11-28 DIAGNOSIS — R7301 Impaired fasting glucose: Secondary | ICD-10-CM | POA: Diagnosis not present

## 2016-11-28 DIAGNOSIS — E559 Vitamin D deficiency, unspecified: Secondary | ICD-10-CM | POA: Diagnosis not present

## 2016-11-28 DIAGNOSIS — M8588 Other specified disorders of bone density and structure, other site: Secondary | ICD-10-CM | POA: Diagnosis not present

## 2016-11-28 DIAGNOSIS — E039 Hypothyroidism, unspecified: Secondary | ICD-10-CM | POA: Diagnosis not present

## 2016-11-28 DIAGNOSIS — J069 Acute upper respiratory infection, unspecified: Secondary | ICD-10-CM | POA: Diagnosis not present

## 2016-11-28 DIAGNOSIS — I10 Essential (primary) hypertension: Secondary | ICD-10-CM | POA: Diagnosis not present

## 2016-11-28 DIAGNOSIS — G479 Sleep disorder, unspecified: Secondary | ICD-10-CM | POA: Diagnosis not present

## 2016-11-28 MED FILL — OSELTAMIVIR PHOS 75 MG CAP: 75 | 5 days supply | Qty: 10 | Fill #0

## 2016-11-30 MED FILL — LOSARTAN POTASSIUM 50 MG TA: 50 | 90 days supply | Qty: 90 | Fill #0

## 2016-11-30 MED FILL — HYDROCHLOROTHIAZIDE 12.5 MG: 12.5 | 90 days supply | Qty: 90 | Fill #1

## 2016-11-30 MED FILL — SIMVASTATIN 40 MG TABLET: 40 | 90 days supply | Qty: 90 | Fill #0

## 2016-11-30 MED FILL — LEVOTHYROXINE 88 MCG TABLET: 88 | 90 days supply | Qty: 90 | Fill #0

## 2016-12-02 ENCOUNTER — Other Ambulatory Visit: Payer: Self-pay | Admitting: Family Medicine

## 2016-12-02 DIAGNOSIS — N63 Unspecified lump in unspecified breast: Secondary | ICD-10-CM

## 2016-12-05 DIAGNOSIS — L438 Other lichen planus: Secondary | ICD-10-CM | POA: Diagnosis not present

## 2016-12-05 DIAGNOSIS — D2272 Melanocytic nevi of left lower limb, including hip: Secondary | ICD-10-CM | POA: Diagnosis not present

## 2016-12-05 DIAGNOSIS — D1801 Hemangioma of skin and subcutaneous tissue: Secondary | ICD-10-CM | POA: Diagnosis not present

## 2016-12-05 DIAGNOSIS — L570 Actinic keratosis: Secondary | ICD-10-CM | POA: Diagnosis not present

## 2016-12-05 DIAGNOSIS — L821 Other seborrheic keratosis: Secondary | ICD-10-CM | POA: Diagnosis not present

## 2016-12-05 DIAGNOSIS — L812 Freckles: Secondary | ICD-10-CM | POA: Diagnosis not present

## 2016-12-05 DIAGNOSIS — D2262 Melanocytic nevi of left upper limb, including shoulder: Secondary | ICD-10-CM | POA: Diagnosis not present

## 2016-12-07 ENCOUNTER — Ambulatory Visit
Admission: RE | Admit: 2016-12-07 | Discharge: 2016-12-07 | Disposition: A | Discharge status: Home / Self Care | Payer: 59 | Source: Ambulatory Visit | Attending: Family Medicine | Admitting: Family Medicine

## 2016-12-07 DIAGNOSIS — N63 Unspecified lump in unspecified breast: Secondary | ICD-10-CM

## 2016-12-07 DIAGNOSIS — N6489 Other specified disorders of breast: Secondary | ICD-10-CM | POA: Diagnosis not present

## 2016-12-07 DIAGNOSIS — R928 Other abnormal and inconclusive findings on diagnostic imaging of breast: Secondary | ICD-10-CM | POA: Diagnosis not present

## 2016-12-14 ENCOUNTER — Ambulatory Visit: Payer: 59

## 2017-01-11 MED FILL — PANTOPRAZOLE SOD DR 40 MG T: 40 | 90 days supply | Qty: 90 | Fill #0

## 2017-02-21 MED FILL — LOSARTAN POTASSIUM 50 MG TA: 50 | 90 days supply | Qty: 90 | Fill #1

## 2017-02-21 MED FILL — SIMVASTATIN 40 MG TABLET: 40 | 90 days supply | Qty: 90 | Fill #1

## 2017-02-21 MED FILL — HYDROCHLOROTHIAZIDE 12.5 MG: 12.5 | 90 days supply | Qty: 90 | Fill #0

## 2017-02-21 MED FILL — LEVOTHYROXINE 88 MCG TABLET: 88 | 90 days supply | Qty: 90 | Fill #1

## 2017-03-07 MED FILL — SHINGRIX 50 MCG SUS: 50 | 30 days supply | Qty: 1 | Fill #0

## 2017-03-14 DIAGNOSIS — H5213 Myopia, bilateral: Secondary | ICD-10-CM | POA: Diagnosis not present

## 2017-03-14 DIAGNOSIS — H40023 Open angle with borderline findings, high risk, bilateral: Secondary | ICD-10-CM | POA: Diagnosis not present

## 2017-03-14 DIAGNOSIS — E119 Type 2 diabetes mellitus without complications: Secondary | ICD-10-CM | POA: Diagnosis not present

## 2017-03-14 DIAGNOSIS — H353130 Nonexudative age-related macular degeneration, bilateral, stage unspecified: Secondary | ICD-10-CM | POA: Diagnosis not present

## 2017-03-14 DIAGNOSIS — H2513 Age-related nuclear cataract, bilateral: Secondary | ICD-10-CM | POA: Diagnosis not present

## 2017-03-22 MED FILL — MELOXICAM 15 MG TABLET: 15 | 90 days supply | Qty: 90 | Fill #0

## 2017-03-22 MED FILL — traZODone HCL 50 MG TABS: 50 | 90 days supply | Qty: 180 | Fill #0

## 2017-04-05 MED FILL — PANTOPRAZOLE SOD DR 40 MG T: 40 | 90 days supply | Qty: 90 | Fill #1

## 2017-05-05 DIAGNOSIS — M1712 Unilateral primary osteoarthritis, left knee: Secondary | ICD-10-CM | POA: Diagnosis not present

## 2017-05-10 MED FILL — SHINGRIX 50 MCG SUS: 50 | 30 days supply | Qty: 1 | Fill #1

## 2017-05-15 MED FILL — HYDROCHLOROTHIAZIDE 12.5 MG: 12.5 | 90 days supply | Qty: 90 | Fill #1

## 2017-05-15 MED FILL — LEVOTHYROXINE 88 MCG TABLET: 88 | 90 days supply | Qty: 90 | Fill #0

## 2017-05-15 MED FILL — SIMVASTATIN 40 MG TABLET: 40 | 90 days supply | Qty: 90 | Fill #0

## 2017-05-15 MED FILL — LOSARTAN POTASSIUM 50 MG TA: 50 | 90 days supply | Qty: 90 | Fill #0

## 2017-06-21 MED FILL — PANTOPRAZOLE SOD DR 40 MG T: 40 | 90 days supply | Qty: 90 | Fill #0

## 2017-07-13 DIAGNOSIS — R1010 Upper abdominal pain, unspecified: Secondary | ICD-10-CM | POA: Diagnosis not present

## 2017-07-18 ENCOUNTER — Other Ambulatory Visit (HOSPITAL_COMMUNITY): Payer: Self-pay | Admitting: Family Medicine

## 2017-07-18 DIAGNOSIS — R194 Change in bowel habit: Secondary | ICD-10-CM | POA: Diagnosis not present

## 2017-07-18 DIAGNOSIS — R109 Unspecified abdominal pain: Secondary | ICD-10-CM | POA: Diagnosis not present

## 2017-07-18 DIAGNOSIS — Z800 Family history of malignant neoplasm of digestive organs: Secondary | ICD-10-CM | POA: Diagnosis not present

## 2017-07-27 ENCOUNTER — Ambulatory Visit (HOSPITAL_COMMUNITY)
Admission: RE | Admit: 2017-07-27 | Discharge: 2017-07-27 | Disposition: A | Discharge status: Home / Self Care | Payer: 59 | Source: Ambulatory Visit | Attending: Family Medicine | Admitting: Family Medicine

## 2017-07-27 DIAGNOSIS — R109 Unspecified abdominal pain: Secondary | ICD-10-CM | POA: Diagnosis not present

## 2017-07-27 DIAGNOSIS — I700 Atherosclerosis of aorta: Secondary | ICD-10-CM | POA: Insufficient documentation

## 2017-07-27 DIAGNOSIS — M5137 Other intervertebral disc degeneration, lumbosacral region: Secondary | ICD-10-CM | POA: Insufficient documentation

## 2017-07-27 MED ORDER — IOPAMIDOL (ISOVUE-300) INJECTION 61%
INTRAVENOUS | Status: AC
  Filled 2017-07-27: qty 100

## 2017-07-27 MED ORDER — IOPAMIDOL (ISOVUE-300) INJECTION 61%
100.0000 mL | Freq: Once | INTRAVENOUS | Status: AC | PRN
  Administered 2017-07-27: 100 mL via INTRAVENOUS

## 2017-07-31 ENCOUNTER — Encounter: Payer: Self-pay | Admitting: Internal Medicine

## 2017-08-01 ENCOUNTER — Encounter: Payer: 59 | Attending: Family Medicine | Admitting: Registered"

## 2017-08-01 ENCOUNTER — Encounter: Payer: Self-pay | Admitting: Registered"

## 2017-08-01 DIAGNOSIS — Z713 Dietary counseling and surveillance: Secondary | ICD-10-CM | POA: Insufficient documentation

## 2017-08-01 NOTE — Patient Instructions (Addendum)
-   Add in healthy snack options between meals such as greek yogurt, peppers or carrots with hummus, apples or celery with peanut butter, and lightly salted nuts to help with portion control.   - Consume more non-starchy vegetables than starchy vegetables at meal times.   - Aim to consume at least 64 oz fluid.   - Aim to get up around usual time on "off" days & walk in the mornings for 30 min as exercise.  - Aim for at least 30 min/day, 5 days/week of physical activity.

## 2017-08-01 NOTE — Progress Notes (Signed)
  Medical Nutrition Therapy:  Appt start time: 2:05 end time: 2:50.   Assessment:  Primary concerns today: Here for insurance wellness benefits. Pt states she wants to control BS more evenly controlled during the day. Pt states she needs more helpful things during the week. 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 Pt states she typically goes to bed 10-10:30pm on work nights and 11:30pm when off. Pt states she does not sleep well at night. Pt states she averages 8000-14000 steps/day on days off work. Pt states she eats out quite a bit and sometimes eats meals with her mom. Pt states she lives alone and sometimes it is challenging to prep meals for one person.    Preferred Learning Style:   No preference indicated   Learning Readiness:   Contemplating   MEDICATIONS: See list   DIETARY INTAKE:  Usual eating pattern includes 3 meals and 1 snack per day.  24-hr recall:  B (6:30 AM): oatmeal, sausage, yogurt or cereal  Snk (9:30 AM): sometimes crackers, nabs  L ( PM): restaurant- sandwich, potato salad Snk ( PM): none D (6:30-8PM): meat, vegetables, potatoes Snk ( PM): occasionally Beverages: water, unsweetened/sweet tea, diet soda  Usual physical activity: walks dog 40-45 min, 7 days/week  Estimated energy needs: 1600 calories 180 g carbohydrates 120 g protein 44 g fat  Progress Towards Goal(s):  In progress.   Nutritional Diagnosis:  Inadequate fluid intake As related to less than optimal fluid consumption.  As evidenced by pt report of drinking less than 64 fluid ounces daily.    Intervention:  Nutrition education and counseling. Pt was educated on the importance of eating balanced meals and incorporating healthy snacks into the day.  Goals: - Add in healthy snack options between meals such as greek yogurt, peppers or carrots with hummus, apples or celery with peanut butter, and lightly salted nuts to help with portion control.  - Consume more  non-starchy vegetables than starchy vegetables at meal times.  - Aim to consume at least 64 oz fluid.  - Aim to get up around usual time on "off" days & walk in the mornings for 30 min as exercise. - Aim for at least 30 min/day, 5 days/week of physical activity.   Teaching Method Utilized:  Visual Auditory Hands on  Handouts given during visit include:  none  Barriers to learning/adherence to lifestyle change: contemplative stage of change  Demonstrated degree of understanding via:  Teach Back   Monitoring/Evaluation:  Dietary intake, exercise, and body weight prn.

## 2017-08-07 MED FILL — MELOXICAM 15 MG TABLET: 15 | 90 days supply | Qty: 90 | Fill #1

## 2017-08-07 MED FILL — SIMVASTATIN 40 MG TABLET: 40 | 90 days supply | Qty: 90 | Fill #1

## 2017-08-07 MED FILL — LOSARTAN POTASSIUM 50 MG TA: 50 | 90 days supply | Qty: 90 | Fill #1

## 2017-08-07 MED FILL — HYDROCHLOROTHIAZIDE 12.5 MG: 12.5 | 90 days supply | Qty: 90 | Fill #0

## 2017-08-07 MED FILL — LEVOTHYROXINE 88 MCG TABLET: 88 | 90 days supply | Qty: 90 | Fill #1

## 2017-09-01 ENCOUNTER — Encounter: Payer: Self-pay | Admitting: Cardiovascular Disease

## 2017-09-04 NOTE — Progress Notes (Signed)
Cardiology Office Note   Date:  09/07/2017   ID:  Morgan Williams, DOB [birth year], MRN [id number]  PCP:  Leighton Ruff, MD  Cardiologist:   Jenkins Rouge, MD   No chief complaint on file.     History of Present Illness: Morgan Williams is a 61 y.o. female who presents for consultation regarding aortic atherosclerosis Referred by Dr Drema Dallas  Reviewed CT scan from 07/29/17 Abdomen done for "pain" Moderate atherosclerosis of aorta no aneurysm Concern as marker for  Concurrent CAD. CRF;s include HTN, HLD She also has mild asthma and previous Graves disease with surgical Rx. 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 Sedentary But no cardiac symptoms no chest pain, dyspnea palpitations TIA or claudication    Past Medical History:  Diagnosis Date  . Abdominal pain   . Allergy   . Aortic atherosclerosis ([place])   . Arthritis   . Asthma   . Eczema   . Elevated blood sugar level   . Hyperlipidemia   . Hypertension   . Hypertensive retinopathy   . Insomnia   . Rosacea   . Thyroid disease   . Vitamin D deficiency   . Vocal cord nodules     Past Surgical History:  Procedure Laterality Date  . CESAREAN SECTION    . THYROIDECTOMY       Current Outpatient Medications  Medication Sig Dispense Refill  . cholecalciferol (VITAMIN D) 1000 units tablet Take 1,000 Units by mouth daily.    . fexofenadine (ALLEGRA) 180 MG tablet Take 180 mg by mouth daily.    . hydrochlorothiazide (MICROZIDE) 12.5 MG capsule Take 12.5 mg by mouth daily.    Marland Kitchen levothyroxine (SYNTHROID, LEVOTHROID) 88 MCG tablet Take 88 mcg by mouth daily before breakfast.    . losartan (COZAAR) 50 MG tablet Take 50 mg by  mouth daily.    . meloxicam (MOBIC) 15 MG tablet Take 15 mg by mouth daily.    . Multiple Vitamins-Minerals (MULTIVITAMIN WITH MINERALS) tablet Take 1 tablet by mouth daily.    . pantoprazole (PROTONIX) 40 MG tablet Take 40 mg by mouth daily.    . simvastatin (ZOCOR) 40 MG tablet Take 40 mg by mouth daily.    Marland Kitchen VITAMIN D, ERGOCALCIFEROL, PO Take 5,000 mg daily by mouth.     No current facility-administered medications for this visit.     Allergies:   Sulfa antibiotics    Social History:  The patient  reports that she has quit smoking. she has never used smokeless tobacco. She reports that she drinks about 0.6 oz of alcohol per week. She reports that she does not use drugs.   Family History:  The patient's family history includes Cancer in her father; Diabetes in her mother; Heart disease in her father; Hyperlipidemia in her mother; Hypertension in her father and mother; Thyroid disease in her daughter.    ROS:  Please see the history of present illness.   Otherwise, review of systems are positive for none.   All other systems are reviewed and negative.    PHYSICAL EXAM: VS:  BP 122/82   Pulse 81  Ht 5' 3.5" (1.613 m)   Wt 225 lb 9.6 oz (102.3 kg)   SpO2 96%   BMI 39.34 kg/m  , BMI Body mass index is 39.34 kg/m. Affect appropriate Healthy:  appears stated age 7: normal Neck supple with no adenopathy JVP normal no bruits no thyromegaly Lungs clear with no wheezing and good diaphragmatic motion Heart:  S1/S2 SEM murmur, no rub, gallop or click PMI normal Abdomen: benighn, BS positve, no tenderness, no AAA no bruit.  No HSM or HJR Distal pulses intact with no bruits No edema Neuro non-focal Skin warm and dry No muscular weakness    EKG:  NSR rate 83 LVH inferior lateral T wave changes LVH   Recent Labs: No results found for requested labs within last 8760 hours.    Lipid Panel No results found for: CHOL, TRIG, HDL, CHOLHDL, VLDL, LDLCALC, LDLDIRECT    Wt  Readings from Last 3 Encounters:  09/07/17 225 lb 9.6 oz (102.3 kg)  10/29/16 219 lb (99.3 kg)  05/12/15 224 lb 6.4 oz (101.8 kg)      Other studies Reviewed: Additional studies/ records that were reviewed today include: Primary care notes CT abdomen, CXR and labs .    ASSESSMENT AND PLAN:  1. Atherosclerosis likely from age and previous smoking will order vascular screening tests To include calcium score, carotid, AAA, and ABI's continue statin start 81 mg ASA  2. HTN continue diuretic and ARB 3. HLD if calcium score high may need to change to crestor and have tighter LDL control  4. Thyroid continue replacement TSH with primary  5. Glucose Intolerance discussed low carb diet A1c with primary  6. Murmur:  SEM f/u echo AV sclerosis  7. Abnormal ECG. Likely from BP and LVH Echo no chest pain    Current medicines are reviewed at length with the patient today.  The patient does not have concerns regarding medicines.  The following changes have been made:  no change  Labs/ tests ordered today include: Echo , vascular screening calcium score   Orders Placed This Encounter  Procedures  . CT CARDIAC SCORING  . EKG 12-Lead  . ECHOCARDIOGRAM COMPLETE     Disposition:   FU with cardiology in a year      Signed, Jenkins Rouge, MD  09/07/2017 5:28 PM    [address], [place], [place]  [address] Phone: [phone]; Fax: [phone]

## 2017-09-07 ENCOUNTER — Ambulatory Visit (INDEPENDENT_AMBULATORY_CARE_PROVIDER_SITE_OTHER): Payer: 59 | Admitting: Cardiovascular Disease

## 2017-09-07 ENCOUNTER — Encounter: Payer: Self-pay | Admitting: Cardiovascular Disease

## 2017-09-07 VITALS — BP 122/82 | HR 81 | Ht 63.5 in | Wt 225.6 lb

## 2017-09-07 DIAGNOSIS — I10 Essential (primary) hypertension: Secondary | ICD-10-CM

## 2017-09-07 DIAGNOSIS — R011 Cardiac murmur, unspecified: Secondary | ICD-10-CM

## 2017-09-07 NOTE — Patient Instructions (Addendum)
Medication Instructions:  Your physician recommends that you continue on your current medications as directed. Please refer to the Current Medication list given to you today.  Lab work: NONE  Testing/Procedures: Your physician has requested that you have an echocardiogram. Echocardiography is a painless test that uses sound waves to create images of your heart. It provides your doctor with information about the size and shape of your heart and how well your heart's chambers and valves are working. This procedure takes approximately one hour. There are no restrictions for this procedure.  Your physician has requested that you have a vascular screening duplex to look at blood flow in the your extremities and neck.  Cardiac CT scanning Calcium score, (CAT scanning), is a noninvasive, special x-ray that produces cross-sectional images of the body using x-rays and a computer. CT scans help physicians diagnose and treat medical conditions. For some CT exams, a contrast material is used to enhance visibility in the area of the body being studied. CT scans provide greater clarity and reveal more details than regular x-ray exams.  Follow-Up: Your physician wants you to follow-up in: 12 months with Dr. Johnsie Cancel. You will receive a reminder letter in the mail two months in advance. If you don't receive a letter, please call our office to schedule the follow-up appointment.   If you need a refill on your cardiac medications before your next appointment, please call your pharmacy.

## 2017-09-08 DIAGNOSIS — Z7289 Other problems related to lifestyle: Secondary | ICD-10-CM | POA: Diagnosis not present

## 2017-09-08 DIAGNOSIS — K219 Gastro-esophageal reflux disease without esophagitis: Secondary | ICD-10-CM | POA: Insufficient documentation

## 2017-09-08 DIAGNOSIS — J381 Polyp of vocal cord and larynx: Secondary | ICD-10-CM | POA: Diagnosis not present

## 2017-09-08 DIAGNOSIS — E039 Hypothyroidism, unspecified: Secondary | ICD-10-CM | POA: Diagnosis not present

## 2017-09-08 DIAGNOSIS — Z87891 Personal history of nicotine dependence: Secondary | ICD-10-CM | POA: Diagnosis not present

## 2017-09-08 DIAGNOSIS — Z8639 Personal history of other endocrine, nutritional and metabolic disease: Secondary | ICD-10-CM | POA: Diagnosis not present

## 2017-09-09 DIAGNOSIS — J383 Other diseases of vocal cords: Secondary | ICD-10-CM | POA: Insufficient documentation

## 2017-09-12 MED FILL — PANTOPRAZOLE SOD DR 20 MG T: 20 | 90 days supply | Qty: 180 | Fill #0

## 2017-09-25 ENCOUNTER — Ambulatory Visit (HOSPITAL_BASED_OUTPATIENT_CLINIC_OR_DEPARTMENT_OTHER): Payer: 59

## 2017-09-25 ENCOUNTER — Ambulatory Visit (INDEPENDENT_AMBULATORY_CARE_PROVIDER_SITE_OTHER)
Admission: RE | Admit: 2017-09-25 | Discharge: 2017-09-25 | Disposition: A | Discharge status: Home / Self Care | Payer: Self-pay | Source: Ambulatory Visit | Attending: Cardiovascular Disease | Admitting: Cardiovascular Disease

## 2017-09-25 ENCOUNTER — Other Ambulatory Visit: Payer: Self-pay

## 2017-09-25 ENCOUNTER — Ambulatory Visit (HOSPITAL_COMMUNITY)
Admission: RE | Admit: 2017-09-25 | Discharge: 2017-09-25 | Disposition: A | Discharge status: Home / Self Care | Payer: 59 | Source: Ambulatory Visit | Attending: Cardiology | Admitting: Cardiology

## 2017-09-25 DIAGNOSIS — R011 Cardiac murmur, unspecified: Secondary | ICD-10-CM

## 2017-09-25 DIAGNOSIS — I10 Essential (primary) hypertension: Secondary | ICD-10-CM | POA: Insufficient documentation

## 2017-09-25 DIAGNOSIS — I071 Rheumatic tricuspid insufficiency: Secondary | ICD-10-CM | POA: Diagnosis not present

## 2017-09-25 MED ORDER — PERFLUTREN LIPID MICROSPHERE
1.0000 mL | INTRAVENOUS | Status: AC | PRN
  Administered 2017-09-25: 2 mL via INTRAVENOUS

## 2017-09-26 DIAGNOSIS — H04123 Dry eye syndrome of bilateral lacrimal glands: Secondary | ICD-10-CM | POA: Diagnosis not present

## 2017-09-26 DIAGNOSIS — H40023 Open angle with borderline findings, high risk, bilateral: Secondary | ICD-10-CM | POA: Diagnosis not present

## 2017-10-06 ENCOUNTER — Encounter: Payer: Self-pay | Admitting: Internal Medicine

## 2017-10-06 ENCOUNTER — Ambulatory Visit (INDEPENDENT_AMBULATORY_CARE_PROVIDER_SITE_OTHER): Payer: 59 | Admitting: Internal Medicine

## 2017-10-06 VITALS — BP 148/80 | HR 86 | Ht 63.5 in | Wt 223.0 lb

## 2017-10-06 DIAGNOSIS — R1032 Left lower quadrant pain: Secondary | ICD-10-CM | POA: Diagnosis not present

## 2017-10-06 DIAGNOSIS — K219 Gastro-esophageal reflux disease without esophagitis: Secondary | ICD-10-CM | POA: Diagnosis not present

## 2017-10-06 DIAGNOSIS — R197 Diarrhea, unspecified: Secondary | ICD-10-CM

## 2017-10-06 DIAGNOSIS — Z1211 Encounter for screening for malignant neoplasm of colon: Secondary | ICD-10-CM | POA: Diagnosis not present

## 2017-10-06 NOTE — Progress Notes (Addendum)
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 Patient has not been seen in this office for approximately 10 years. In October 2007 she underwent upper endoscopy for dysphagia, hoarseness, and reflux symptoms. She was found to have vocal cord nodules. The remainder of the examination was normal. She was empirically dilated with a 54 [place] Maloney dilator. Was placed on Nexium 40 mg twice daily. She tells me that she has continued on PPI chronically. Various dosages. Currently on pantoprazole 20 mg twice daily. No active reflux symptoms. Still with hoarseness. No dysphagia. Next, she reports a several year history of diarrhea. 2-3 loose bowel movements per day, mostly in the morning or afternoon. No nocturnal symptoms. Not necessarily influenced by meals. No steatorrhea. No weight loss. No new medications. Thyroid is regulated. There is a family history of colon cancer in her father in his mid 79s. No other history of gastrointestinal malignancy in the family. Patient also reports that she was having problems with left-sided abdominal pain. Outside workup reviewed CT scan of the abdomen and pelvis with contrast was performed 07/27/2017. Reviewed. No significant abnormalities. Her pain has resolved. She was told it was likely musculoskeletal. She did undergo colonoscopy in June 2009. The examination was normal  REVIEW OF SYSTEMS:  All non-GI ROS negative unless otherwise stated in the history of present illness except for hoarseness, heart murmur, allergies,  Past Medical History:  Diagnosis Date  . Abdominal pain   . Allergy   . Aortic atherosclerosis ([place])   . Arthritis   . Asthma   . Breast mass    left, turned out not to be a mass  . Eczema   . Elevated blood sugar level   . GERD  (gastroesophageal reflux disease)   . Graves disease   . Hyperlipidemia   . Hypertension   . Hypertensive retinopathy   . Insomnia   . Obesity   . Osteopenia   . Rosacea   . Thyroid disease   . Vitamin D deficiency   . Vocal cord nodules     Past Surgical History:  Procedure Laterality Date  . CESAREAN SECTION     x 1  . THYROIDECTOMY      Social History Morgan Williams  reports that she has quit smoking. Her smoking use included cigarettes. She quit after 25.00 years of use. she has never used smokeless tobacco. She reports that she drinks about 0.6 oz of alcohol per week. She reports that she does not use drugs.  family history includes Colon cancer (age of onset: 31) in her father; Diabetes in her mother; Heart disease in her father; Hyperlipidemia in her mother; Hypertension in her father and mother; Thyroid disease in her daughter.  Allergies  Allergen Reactions  . Sulfa Antibiotics Rash       PHYSICAL EXAMINATION: Vital signs: BP (!) 148/80   Pulse 86   Ht 5' 3.5" (1.613 m)   Wt 223 lb (101.2 kg)   BMI 38.88 kg/m   Constitutional: generally well-appearing, no acute distress Psychiatric: alert and oriented x3, cooperative Eyes: extraocular movements intact, anicteric, conjunctiva pink Neck: Transverse thyroidectomy scar Mouth: oral pharynx moist, no lesions Neck: supple no lymphadenopathy Cardiovascular: heart regular rate and rhythm, no murmur Lungs: clear to auscultation bilaterally Abdomen: soft, obese, nontender, nondistended, no obvious ascites, no peritoneal  signs, normal bowel sounds, no organomegaly Rectal: Deferred until colonoscopy Extremities: no clubbing, cyanosis, or lower extremity edema bilaterally Skin: no lesions on visible extremities Neuro: No focal deficits. Cranial nerves intact  ASSESSMENT:  #1. Chronic diarrhea. Rule out IBS. Rule out microscopic colitis. Rule out bile salt related #2. Chronic GERD. Classic symptoms controlled with  PPI. See ENT for pharyngeal symptoms #3. Colon cancer screening. Patient is due. Negative exam 10 years ago. Family history her father at an advanced age. Normal colonoscopy June 2009 #4. Gen. medical problems. Stable #5. Transient left-sided abdominal pain. Resolved. Question musculoskeletal   PLAN:  #1. Reflux precautions #2. Weight loss #3. Continue PPI. Lowest dose to manage classic reflux symptoms #4. Screening colonoscopy. As well, biopsies to rule out microscopic colitis.The nature of the procedure, as well as the risks, benefits, and alternatives were carefully and thoroughly reviewed with the patient. Ample time for discussion and questions allowed. The patient understood, was satisfied, and agreed to proceed.  A copy of this note has been sent to Dr. Drema Dallas

## 2017-10-06 NOTE — Patient Instructions (Signed)
We will call you to schedule your colonoscopy

## 2017-10-12 ENCOUNTER — Encounter: Payer: Self-pay | Admitting: Internal Medicine

## 2017-10-18 MED FILL — HYDROCHLOROTHIAZIDE 12.5 MG: 12.5 | 90 days supply | Qty: 90 | Fill #1

## 2017-10-18 MED FILL — metroNIDAZOLE 0.75 % LOTN: 0.75 | 90 days supply | Qty: 177 | Fill #0

## 2017-10-18 MED FILL — VENTOLIN HFA 90 MCG INHALER: 108 (90 BAS | 30 days supply | Qty: 18 | Fill #0

## 2017-10-18 MED FILL — MELOXICAM 15 MG TABLET: 15 | 90 days supply | Qty: 90 | Fill #0

## 2017-10-18 MED FILL — OLOPATADINE HCL 0.1 % SOLN: 0.1 | 75 days supply | Qty: 15 | Fill #0

## 2017-10-18 MED FILL — SIMVASTATIN 40 MG TABLET: 40 | 90 days supply | Qty: 90 | Fill #0

## 2017-10-18 MED FILL — LEVOTHYROXINE 88 MCG TABLET: 88 | 90 days supply | Qty: 90 | Fill #0

## 2017-10-18 MED FILL — LOSARTAN POTASSIUM 50 MG TA: 50 | 90 days supply | Qty: 90 | Fill #0

## 2017-10-19 DIAGNOSIS — R490 Dysphonia: Secondary | ICD-10-CM | POA: Diagnosis not present

## 2017-10-19 DIAGNOSIS — J383 Other diseases of vocal cords: Secondary | ICD-10-CM | POA: Diagnosis not present

## 2017-10-19 DIAGNOSIS — K219 Gastro-esophageal reflux disease without esophagitis: Secondary | ICD-10-CM | POA: Diagnosis not present

## 2017-10-30 ENCOUNTER — Encounter (HOSPITAL_BASED_OUTPATIENT_CLINIC_OR_DEPARTMENT_OTHER): Payer: Self-pay | Admitting: *Deleted

## 2017-11-02 ENCOUNTER — Encounter (HOSPITAL_BASED_OUTPATIENT_CLINIC_OR_DEPARTMENT_OTHER)
Admission: RE | Admit: 2017-11-02 | Discharge: 2017-11-02 | Disposition: A | Discharge status: Home / Self Care | Payer: 59 | Source: Ambulatory Visit | Attending: Otolaryngology | Admitting: Otolaryngology

## 2017-11-02 DIAGNOSIS — Z882 Allergy status to sulfonamides status: Secondary | ICD-10-CM | POA: Diagnosis not present

## 2017-11-02 DIAGNOSIS — Z01812 Encounter for preprocedural laboratory examination: Secondary | ICD-10-CM | POA: Insufficient documentation

## 2017-11-02 DIAGNOSIS — R490 Dysphonia: Secondary | ICD-10-CM | POA: Diagnosis not present

## 2017-11-02 DIAGNOSIS — J383 Other diseases of vocal cords: Secondary | ICD-10-CM | POA: Insufficient documentation

## 2017-11-02 LAB — BASIC METABOLIC PANEL
Anion gap: 7 (ref 5–15)
BUN: 21 mg/dL — AB (ref 6–20)
CHLORIDE: 109 mmol/L (ref 101–111)
CO2: 24 mmol/L (ref 22–32)
Calcium: 9.4 mg/dL (ref 8.9–10.3)
Creatinine, Ser: 0.85 mg/dL (ref 0.44–1.00)
GFR calc Af Amer: >60 mL/min (ref >60)
GFR calc non Af Amer: >60 mL/min (ref >60)
Glucose, Bld: 117 mg/dL — ABNORMAL HIGH (ref 65–99)
POTASSIUM: 4.1 mmol/L (ref 3.5–5.1)
SODIUM: 140 mmol/L (ref 135–145)

## 2017-11-03 NOTE — H&P (Signed)
Otolaryngology Clinic Note  HPI:    Morgan Williams is a 62 y.o. female patient of Gerrit Heck, MD for follow-up evaluation of hoarseness and a left vocal cord lesion.  She is using her Protonix in the morning and at bedtime.  Her reflux feels better controlled.  Voice is stable but still quite hoarse.  No breathing difficulty.  Voice use is fatiguing.  No swallowing issues.   PMH/Meds/All/SocHx/FamHx/ROS:   Past Medical History      Past Medical History:  Diagnosis Date  . Arthritis   . Asthma   . High cholesterol   . Hypertension   . Hypothyroidism       Past Surgical History       Past Surgical History:  Procedure Laterality Date  . CESAREAN SECTION    . WISDOM TOOTH EXTRACTION        No family history of bleeding disorders, wound healing problems or difficulty with anesthesia.   Social History  Social History        Social History  . Marital status: N/A    Spouse name: N/A  . Number of children: N/A  . Years of education: N/A      Occupational History  . Not on file.       Social History Main Topics  . Smoking status: Former Research scientist (life sciences)  . Smokeless tobacco: Never Used  . Alcohol use Yes  . Drug use: Unknown  . Sexual activity: Not on file       Other Topics Concern  . Not on file      Social History Narrative  . No narrative on file       Current Outpatient Prescriptions:  .  albuterol sulfate (VENTOLIN HFA INHL), Inhale into the lungs., Disp: , Rfl:  .  hydroCHLOROthiazide (MICROZIDE) 12.5 mg capsule, TAKE 1 CAPSULE BY MOUTH ONCE DAILY, Disp: , Rfl: 1 .  levothyroxine (SYNTHROID, LEVOTHROID) 88 MCG tablet, TAKE 1 TABLET BY MOUTH EVERY MORNING ON AN EMPTY STOMACH, Disp: , Rfl: 1 .  losartan (COZAAR) 50 MG tablet, TAKE 1 TABLET BY MOUTH ONCE DAILY, Disp: , Rfl: 1 .  meloxicam (MOBIC) 15 MG tablet, TAKE 1 TABLET BY MOUTH ONCE DAILY AS NEEDED, Disp: , Rfl: 1 .  metroNIDAZOLE 0.75 % Lotn, Apply topically., Disp: ,  Rfl:  .  multivitamin with minerals tablet, Take 1 tablet by mouth., Disp: , Rfl:  .  pantoprazole (PROTONIX) 20 MG tablet, Take 1 tablet (20 mg total) by mouth 2 times daily before meals., Disp: 180 tablet, Rfl: 3 .  pantoprazole (PROTONIX) 40 MG tablet, TAKE 1 TABLET BY MOUTH ONCE DAILY, Disp: , Rfl: 1 .  simvastatin (ZOCOR) 40 MG tablet, TAKE 1 TABLET BY MOUTH EVERY EVENING, Disp: , Rfl: 1  A complete ROS was performed with pertinent positives/negatives noted in the HPI. The remainder of the ROS are negative.    Physical Exam:    There were no vitals taken for this visit. She is using her voice is somewhat forcefully.  It does fluctuate from phonatory to dysphonic.  She is not coughing or clearing her throat.  Using the flexible laryngoscope, there is still a bilobed erythematous/hemorrhagic lesion of the left anterior vocal cord, unilateral.  Cords are mobile.  Airway is good.  Neck unremarkable.  Lungs: Clear to auscultation Heart: Regular rate and rhythm without murmurs Abdomen: Soft, active Extremities: Normal configuration Neurologic: Symmetric, grossly intact.    Flexible Laryngoscopy   Indications were discussed.  Details of  the procedure were explained.  Questions were answered and informed consent was obtained verbally.    Technique:  After anesthetizing the nasal cavity with topical lidocaine and oxymetazoline, the flexible endoscope was introduced and passed through the right nasal cavity into the nasopharynx. The scope was withdrawn from the nose. She tolerated the procedure well.     Impression & Plans:   Left anterior vocal cord lesion with hoarseness.  Plan: I recommend MicroDirect laryngoscopy with removal, possible frozen section, possible laser ablation.  She will need to continue with antireflux measures, and gentle voice use afterwards.  I discussed the surgery in detail including risks and complications.  Questions were answered and  informed consent was obtained.  I will see her back 2 weeks following surgery.   Lilyan Gilford, MD  [id number]

## 2017-11-06 ENCOUNTER — Other Ambulatory Visit: Payer: Self-pay

## 2017-11-06 ENCOUNTER — Encounter (HOSPITAL_BASED_OUTPATIENT_CLINIC_OR_DEPARTMENT_OTHER): Payer: Self-pay | Admitting: *Deleted

## 2017-11-06 ENCOUNTER — Encounter (HOSPITAL_BASED_OUTPATIENT_CLINIC_OR_DEPARTMENT_OTHER)
Admission: RE | Disposition: A | Discharge status: Home / Self Care | Payer: Self-pay | Source: Ambulatory Visit | Attending: Otolaryngology

## 2017-11-06 ENCOUNTER — Ambulatory Visit (HOSPITAL_BASED_OUTPATIENT_CLINIC_OR_DEPARTMENT_OTHER): Payer: 59 | Admitting: Anesthesiology

## 2017-11-06 ENCOUNTER — Ambulatory Visit (HOSPITAL_BASED_OUTPATIENT_CLINIC_OR_DEPARTMENT_OTHER)
Admission: RE | Admit: 2017-11-06 | Discharge: 2017-11-06 | Disposition: A | Discharge status: Home / Self Care | Payer: 59 | Source: Ambulatory Visit | Attending: Otolaryngology | Admitting: Otolaryngology

## 2017-11-06 DIAGNOSIS — D141 Benign neoplasm of larynx: Secondary | ICD-10-CM | POA: Diagnosis not present

## 2017-11-06 DIAGNOSIS — E7800 Pure hypercholesterolemia, unspecified: Secondary | ICD-10-CM | POA: Diagnosis not present

## 2017-11-06 DIAGNOSIS — J381 Polyp of vocal cord and larynx: Secondary | ICD-10-CM | POA: Diagnosis not present

## 2017-11-06 DIAGNOSIS — J383 Other diseases of vocal cords: Secondary | ICD-10-CM | POA: Diagnosis not present

## 2017-11-06 DIAGNOSIS — Z79899 Other long term (current) drug therapy: Secondary | ICD-10-CM | POA: Insufficient documentation

## 2017-11-06 DIAGNOSIS — E039 Hypothyroidism, unspecified: Secondary | ICD-10-CM | POA: Insufficient documentation

## 2017-11-06 DIAGNOSIS — K219 Gastro-esophageal reflux disease without esophagitis: Secondary | ICD-10-CM | POA: Insufficient documentation

## 2017-11-06 DIAGNOSIS — Z87891 Personal history of nicotine dependence: Secondary | ICD-10-CM | POA: Diagnosis not present

## 2017-11-06 DIAGNOSIS — J45909 Unspecified asthma, uncomplicated: Secondary | ICD-10-CM | POA: Insufficient documentation

## 2017-11-06 DIAGNOSIS — I10 Essential (primary) hypertension: Secondary | ICD-10-CM | POA: Insufficient documentation

## 2017-11-06 DIAGNOSIS — Z6838 Body mass index (BMI) 38.0-38.9, adult: Secondary | ICD-10-CM | POA: Diagnosis not present

## 2017-11-06 HISTORY — PX: MICROLARYNGOSCOPY: SHX5208

## 2017-11-06 SURGERY — MICROLARYNGOSCOPY
Anesthesia: General | Laterality: Left

## 2017-11-06 MED ORDER — PHENYLEPHRINE 40 MCG/ML (10ML) SYRINGE FOR IV PUSH (FOR BLOOD PRESSURE SUPPORT)
PREFILLED_SYRINGE | INTRAVENOUS | Status: AC
  Filled 2017-11-06: qty 10

## 2017-11-06 MED ORDER — ATROPINE SULFATE 0.4 MG/ML IJ SOLN
INTRAMUSCULAR | Status: AC
  Filled 2017-11-06: qty 1

## 2017-11-06 MED ORDER — ONDANSETRON HCL 4 MG/2ML IJ SOLN
INTRAMUSCULAR | Status: AC
  Filled 2017-11-06: qty 2

## 2017-11-06 MED ORDER — SUGAMMADEX SODIUM 500 MG/5ML IV SOLN
INTRAVENOUS | Status: DC | PRN
Start: 2017-11-06 — End: 2017-11-06
  Administered 2017-11-06: 300 mg via INTRAVENOUS

## 2017-11-06 MED ORDER — LIDOCAINE 2% (20 MG/ML) 5 ML SYRINGE
INTRAMUSCULAR | Status: AC
  Filled 2017-11-06: qty 5

## 2017-11-06 MED ORDER — DEXAMETHASONE SODIUM PHOSPHATE 10 MG/ML IJ SOLN
INTRAMUSCULAR | Status: AC
  Filled 2017-11-06: qty 1

## 2017-11-06 MED ORDER — MIDAZOLAM HCL 2 MG/2ML IJ SOLN
INTRAMUSCULAR | Status: AC
  Filled 2017-11-06: qty 2

## 2017-11-06 MED ORDER — ROCURONIUM BROMIDE 100 MG/10ML IV SOLN
INTRAVENOUS | Status: DC | PRN
  Administered 2017-11-06: 40 mg via INTRAVENOUS

## 2017-11-06 MED ORDER — PROMETHAZINE HCL 25 MG/ML IJ SOLN: 6.2500 mg | INTRAMUSCULAR | Status: DC | PRN

## 2017-11-06 MED ORDER — MIDAZOLAM HCL 2 MG/2ML IJ SOLN
1.0000 mg | INTRAMUSCULAR | Status: DC | PRN
  Administered 2017-11-06: 2 mg via INTRAVENOUS

## 2017-11-06 MED ORDER — SUCCINYLCHOLINE CHLORIDE 200 MG/10ML IV SOSY
PREFILLED_SYRINGE | INTRAVENOUS | Status: AC
  Filled 2017-11-06: qty 10

## 2017-11-06 MED ORDER — HYDROMORPHONE HCL 1 MG/ML IJ SOLN: 0.2500 mg | INTRAMUSCULAR | Status: DC | PRN

## 2017-11-06 MED ORDER — LIDOCAINE HCL (CARDIAC) 20 MG/ML IV SOLN
INTRAVENOUS | Status: DC | PRN
  Administered 2017-11-06: 100 mg via INTRAVENOUS

## 2017-11-06 MED ORDER — PROPOFOL 10 MG/ML IV BOLUS
INTRAVENOUS | Status: DC | PRN
  Administered 2017-11-06: 200 mg via INTRAVENOUS

## 2017-11-06 MED ORDER — PROPOFOL 500 MG/50ML IV EMUL
INTRAVENOUS | Status: AC
  Filled 2017-11-06: qty 50

## 2017-11-06 MED ORDER — ROCURONIUM BROMIDE 10 MG/ML (PF) SYRINGE
PREFILLED_SYRINGE | INTRAVENOUS | Status: AC
  Filled 2017-11-06: qty 5

## 2017-11-06 MED ORDER — METHYLENE BLUE 0.5 % INJ SOLN
INTRAVENOUS | Status: AC
  Filled 2017-11-06: qty 10

## 2017-11-06 MED ORDER — IBUPROFEN 100 MG/5ML PO SUSP: 400.0000 mg | Freq: Four times a day (QID) | ORAL | Status: DC

## 2017-11-06 MED ORDER — FENTANYL CITRATE (PF) 100 MCG/2ML IJ SOLN
50.0000 ug | INTRAMUSCULAR | Status: DC | PRN
  Administered 2017-11-06: 100 ug via INTRAVENOUS

## 2017-11-06 MED ORDER — SCOPOLAMINE 1 MG/3DAYS TD PT72: 1.0000 | MEDICATED_PATCH | Freq: Once | TRANSDERMAL | Status: DC | PRN

## 2017-11-06 MED ORDER — SUGAMMADEX SODIUM 500 MG/5ML IV SOLN
INTRAVENOUS | Status: AC
  Filled 2017-11-06: qty 5

## 2017-11-06 MED ORDER — COCAINE HCL 4 % EX SOLN
CUTANEOUS | Status: AC
  Filled 2017-11-06: qty 4

## 2017-11-06 MED ORDER — LACTATED RINGERS IV SOLN
INTRAVENOUS | Status: DC
  Administered 2017-11-06: 08:00:00 via INTRAVENOUS

## 2017-11-06 MED ORDER — DEXAMETHASONE SODIUM PHOSPHATE 4 MG/ML IJ SOLN
INTRAMUSCULAR | Status: DC | PRN
  Administered 2017-11-06: 10 mg via INTRAVENOUS

## 2017-11-06 MED ORDER — EPHEDRINE 5 MG/ML INJ
INTRAVENOUS | Status: AC
  Filled 2017-11-06: qty 10

## 2017-11-06 MED ORDER — FENTANYL CITRATE (PF) 100 MCG/2ML IJ SOLN
INTRAMUSCULAR | Status: AC
  Filled 2017-11-06: qty 2

## 2017-11-06 MED ORDER — SCOPOLAMINE 1 MG/3DAYS TD PT72: 1.0000 | MEDICATED_PATCH | TRANSDERMAL | Status: DC

## 2017-11-06 MED ORDER — EPINEPHRINE 30 MG/30ML IJ SOLN
INTRAMUSCULAR | Status: AC
  Filled 2017-11-06: qty 1

## 2017-11-06 MED ORDER — ONDANSETRON HCL 4 MG/2ML IJ SOLN
INTRAMUSCULAR | Status: DC | PRN
  Administered 2017-11-06: 4 mg via INTRAVENOUS

## 2017-11-06 MED ORDER — COCAINE HCL 4 % EX SOLN
CUTANEOUS | Status: DC | PRN
  Administered 2017-11-06: 4 mL via NASAL

## 2017-11-06 SURGICAL SUPPLY — 21 items
BANDAGE EYE OVAL (MISCELLANEOUS) ×2 IMPLANT
CANISTER SUCT 1200ML W/VALVE (MISCELLANEOUS) ×2 IMPLANT
GAUZE SPONGE 4X4 12PLY STRL LF (GAUZE/BANDAGES/DRESSINGS) IMPLANT
GLOVE ECLIPSE 8.0 STRL XLNG CF (GLOVE) ×2 IMPLANT
GOWN STRL REUS W/ TWL LRG LVL3 (GOWN DISPOSABLE) ×1 IMPLANT
GOWN STRL REUS W/ TWL XL LVL3 (GOWN DISPOSABLE) ×1 IMPLANT
GOWN STRL REUS W/TWL LRG LVL3 (GOWN DISPOSABLE)
GOWN STRL REUS W/TWL XL LVL3 (GOWN DISPOSABLE) ×4
GUARD TEETH (MISCELLANEOUS) ×1 IMPLANT
MARKER SKIN DUAL TIP RULER LAB (MISCELLANEOUS) IMPLANT
NDL SPNL 22GX7 QUINCKE BK (NEEDLE) IMPLANT
NEEDLE SPNL 22GX7 QUINCKE BK (NEEDLE) IMPLANT
NS IRRIG 1000ML POUR BTL (IV SOLUTION) ×1 IMPLANT
PACK BASIN DAY SURGERY FS (CUSTOM PROCEDURE TRAY) ×2 IMPLANT
PATTIES SURGICAL .5 X3 (DISPOSABLE) ×1 IMPLANT
SHEET MEDIUM DRAPE 40X70 STRL (DRAPES) ×2 IMPLANT
SLEEVE SCD COMPRESS KNEE MED (MISCELLANEOUS) ×2 IMPLANT
SOLUTION BUTLER CLEAR DIP (MISCELLANEOUS) ×2 IMPLANT
TOWEL OR 17X24 6PK STRL BLUE (TOWEL DISPOSABLE) ×2 IMPLANT
TOWEL OR NON WOVEN STRL DISP B (DISPOSABLE) ×1 IMPLANT
TUBE CONNECTING 20X1/4 (TUBING) ×2 IMPLANT

## 2017-11-06 NOTE — Transfer of Care (Signed)
Immediate Anesthesia Transfer of Care Note  Patient: Morgan Williams  Procedure(s) Performed: MICRO DIRECT LARYNGOSCOPY REMOVAL OF VOCAL CORD (Left )  Patient Location: PACU  Anesthesia Type:General  Level of Consciousness: awake, alert  and oriented  Airway & Oxygen Therapy: Patient Spontanous Breathing and Patient connected to face mask oxygen  Post-op Assessment: Report given to RN and Post -op Vital signs reviewed and stable  Post vital signs: Reviewed and stable  Last Vitals:  Vitals:   11/06/17 0750  BP: (!) 142/77  Pulse: 75  Resp: 20  Temp: 36.6 C  SpO2: 98%    Last Pain:  Vitals:   11/06/17 0750  TempSrc: Oral         Complications: No apparent anesthesia complications

## 2017-11-06 NOTE — Op Note (Signed)
11/06/2017  9:41 AM    [name]  [id number]   Pre-Op Dx:  Hemorrhagic vocal cord polyp  Post-op Dx: Same  Proc: MicroDirect laryngoscopy with cold technique excision vocal cord polyp   Surg:  Jodi Marble T MD  Anes:  GOT  EBL:  Minimal  Comp:  None  Findings:  Pedunculated bilobed 2-3 millimeter hemorrhagic appearing left vocal cord polyp. Slight irritation of the opposing vocal cord but no specific lesion.  Procedure: With the patient in a comfortable supine position, general orotracheal anesthesia was induced without difficulty. At an appropriate level, the table was turned 90 patient placed in reverse Trendelenburg.  Surgical timeout was obtained.  A rubber tooth guard was applied. Taking care to protect lips, teeth, and endotracheal tube, the Dedo anterior commissure laryngoscope was introduced, gently passed into the pharynx and then under the epiglottis in the supraglottis. Lesion as described above was visualized. The laryngoscope was suspended in the standard fashion.  4% cocaine solution was applied on a cotton pledget to the lesion for intraoperative hemostasis. Several minutes were allowed for this to take effect.  The pledget was removed. A photograph was taken using the 0 sinus endoscope.  The lesion was grasped with a cup forceps and lysed and superior edge the vocal cord using a sickle knife and then finally stripped with the cup forceps. It was removed in several pieces. Hemostasis was observed.  Another 4% cocaine pledget was applied to the base for several minutes. This was removed. Postop photographs were taken.  At this point the procedure was completed. The laryngoscope was then suspended and removed. The tooth guard was removed. Dental status was intact. Patient was returned to anesthesia, awakened, extubated, and transferred to recovery in stable condition.   Dispo:   PACU to home  Plan:  Voice rest. Antireflux therapy. Thyroid hormone  supplementation. Recheck my office 2 weeks.  Tyson Alias MD

## 2017-11-06 NOTE — Anesthesia Postprocedure Evaluation (Signed)
Anesthesia Post Note  Patient: Morgan Williams  Procedure(s) Performed: MICRO DIRECT LARYNGOSCOPY REMOVAL OF VOCAL CORD (Left )     Patient location during evaluation: PACU Anesthesia Type: General Level of consciousness: awake and alert Pain management: pain level controlled Vital Signs Assessment: post-procedure vital signs reviewed and stable Respiratory status: spontaneous breathing, nonlabored ventilation, respiratory function stable and patient connected to nasal cannula oxygen Cardiovascular status: blood pressure returned to baseline and stable Postop Assessment: no apparent nausea or vomiting Anesthetic complications: no    Last Vitals:  Vitals:   11/06/17 0750 11/06/17 0940  BP: (!) 142/77 (!) 148/78  Pulse: 75 88  Resp: 20   Temp: 36.6 C (P) 36.5 C  SpO2: 98% 94%    Last Pain:  Vitals:   11/06/17 0750  TempSrc: Oral                 Mayes Sangiovanni

## 2017-11-06 NOTE — Interval H&P Note (Signed)
History and Physical Interval Note:  11/06/2017 8:36 AM  Morgan Williams  has presented today for surgery, with the diagnosis of LEFT VOCAL CORD LESION  The various methods of treatment have been discussed with the patient and family. After consideration of risks, benefits and other options for treatment, the patient has consented to  Procedure(s): MICRO DIRECT LARYNGOSCOPY REMOVAL OF VOCAL CORD LESION POSIBLE LASER (Left) as a surgical intervention .  The patient's history has been re-reviewed, patient re-examined, no change in status, stable for surgery.  I have re-reviewed the patient's chart and labs.  Questions were answered to the patient's satisfaction.     Jodi Marble

## 2017-11-06 NOTE — Discharge Instructions (Signed)
Advance diet and activity as tolerated Continue thyroid hormone replacement and anti-reflux therapy Avoid coughing and throat clearing as much as possible Gentle voice use. Call for breathing problems or bleeding.   Post Anesthesia Home Care Instructions  Activity: Get plenty of rest for the remainder of the day. A responsible individual must stay with you for 24 hours following the procedure.  For the next 24 hours, DO NOT: -Drive a car -Paediatric nurse -Drink alcoholic beverages -Take any medication unless instructed by your physician -Make any legal decisions or sign important papers.  Meals: Start with liquid foods such as gelatin or soup. Progress to regular foods as tolerated. Avoid greasy, spicy, heavy foods. If nausea and/or vomiting occur, drink only clear liquids until the nausea and/or vomiting subsides. Call your physician if vomiting continues.  Special Instructions/Symptoms: Your throat may feel dry or sore from the anesthesia or the breathing tube placed in your throat during surgery. If this causes discomfort, gargle with warm salt water. The discomfort should disappear within 24 hours.  If you had a scopolamine patch placed behind your ear for the management of post- operative nausea and/or vomiting:  1. The medication in the patch is effective for 72 hours, after which it should be removed.  Wrap patch in a tissue and discard in the trash. Wash hands thoroughly with soap and water. 2. You may remove the patch earlier than 72 hours if you experience unpleasant side effects which may include dry mouth, dizziness or visual disturbances. 3. Avoid touching the patch. Wash your hands with soap and water after contact with the patch.

## 2017-11-06 NOTE — Anesthesia Preprocedure Evaluation (Addendum)
Anesthesia Evaluation  Patient identified by MRN, date of birth, ID band Patient awake    Reviewed: Allergy & Precautions, NPO status , Patient's Chart, lab work & pertinent test results  History of Anesthesia Complications Negative for: history of anesthetic complications  Airway Mallampati: II  TM Distance: >3 FB Neck ROM: Full    Dental no notable dental hx. (+) Dental Advisory Given   Pulmonary asthma , former smoker,    Pulmonary exam normal        Cardiovascular hypertension, Pt. on medications Normal cardiovascular exam  ------------------------------------------------------------------- Study Conclusions  - Left ventricle: The cavity size was normal. Systolic function was   normal. The estimated ejection fraction was in the range of 60%   to 65%. Wall motion was normal; there were no regional wall   motion abnormalities. Left ventricular diastolic function   parameters were normal.   Neuro/Psych negative neurological ROS  negative psych ROS   GI/Hepatic Neg liver ROS, GERD  Medicated and Controlled,  Endo/Other  Hyperthyroidism Morbid obesity  Renal/GU negative Renal ROS     Musculoskeletal   Abdominal   Peds  Hematology   Anesthesia Other Findings   Reproductive/Obstetrics                            Anesthesia Physical Anesthesia Plan  ASA: II  Anesthesia Plan: General   Post-op Pain Management:    Induction: Intravenous  PONV Risk Score and Plan: 3 and Ondansetron, Dexamethasone and Scopolamine patch - Pre-op  Airway Management Planned: Oral ETT  Additional Equipment:   Intra-op Plan:   Post-operative Plan: Extubation in OR  Informed Consent: I have reviewed the patients History and Physical, chart, labs and discussed the procedure including the risks, benefits and alternatives for the proposed anesthesia with the patient or authorized representative who has  indicated his/her understanding and acceptance.   Dental advisory given  Plan Discussed with: Anesthesiologist, CRNA and Surgeon  Anesthesia Plan Comments:        Anesthesia Quick Evaluation

## 2017-11-06 NOTE — Anesthesia Procedure Notes (Signed)
Procedure Name: Intubation Date/Time: 11/06/2017 9:05 AM Performed by: Willa Frater, CRNA Pre-anesthesia Checklist: Patient identified, Emergency Drugs available, Suction available and Patient being monitored Patient Re-evaluated:Patient Re-evaluated prior to induction Oxygen Delivery Method: Circle system utilized Preoxygenation: Pre-oxygenation with 100% oxygen Induction Type: IV induction Ventilation: Mask ventilation without difficulty Laryngoscope Size: Mac and 1 Grade View: Grade I Tube type: Oral Tube size: 6.5 mm Number of attempts: 1 Airway Equipment and Method: Stylet and Oral airway Placement Confirmation: ETT inserted through vocal cords under direct vision,  positive ETCO2 and breath sounds checked- equal and bilateral Secured at: 20 cm Tube secured with: Tape Dental Injury: Teeth and Oropharynx as per pre-operative assessment

## 2017-11-07 ENCOUNTER — Encounter (HOSPITAL_BASED_OUTPATIENT_CLINIC_OR_DEPARTMENT_OTHER): Payer: Self-pay | Admitting: Otolaryngology

## 2017-11-21 DIAGNOSIS — E785 Hyperlipidemia, unspecified: Secondary | ICD-10-CM | POA: Diagnosis not present

## 2017-11-21 DIAGNOSIS — E039 Hypothyroidism, unspecified: Secondary | ICD-10-CM | POA: Diagnosis not present

## 2017-11-21 DIAGNOSIS — R7301 Impaired fasting glucose: Secondary | ICD-10-CM | POA: Diagnosis not present

## 2017-11-21 DIAGNOSIS — E559 Vitamin D deficiency, unspecified: Secondary | ICD-10-CM | POA: Diagnosis not present

## 2017-12-01 ENCOUNTER — Other Ambulatory Visit (HOSPITAL_COMMUNITY): Payer: Self-pay | Admitting: Family Medicine

## 2017-12-01 ENCOUNTER — Other Ambulatory Visit: Payer: Self-pay | Admitting: Family Medicine

## 2017-12-01 DIAGNOSIS — R7301 Impaired fasting glucose: Secondary | ICD-10-CM | POA: Diagnosis not present

## 2017-12-01 DIAGNOSIS — Z139 Encounter for screening, unspecified: Secondary | ICD-10-CM

## 2017-12-01 DIAGNOSIS — G479 Sleep disorder, unspecified: Secondary | ICD-10-CM | POA: Diagnosis not present

## 2017-12-01 DIAGNOSIS — R935 Abnormal findings on diagnostic imaging of other abdominal regions, including retroperitoneum: Secondary | ICD-10-CM

## 2017-12-01 DIAGNOSIS — Z0000 Encounter for general adult medical examination without abnormal findings: Secondary | ICD-10-CM | POA: Diagnosis not present

## 2017-12-01 DIAGNOSIS — E039 Hypothyroidism, unspecified: Secondary | ICD-10-CM | POA: Diagnosis not present

## 2017-12-01 DIAGNOSIS — E559 Vitamin D deficiency, unspecified: Secondary | ICD-10-CM | POA: Diagnosis not present

## 2017-12-01 DIAGNOSIS — M8588 Other specified disorders of bone density and structure, other site: Secondary | ICD-10-CM | POA: Diagnosis not present

## 2017-12-01 DIAGNOSIS — E785 Hyperlipidemia, unspecified: Secondary | ICD-10-CM | POA: Diagnosis not present

## 2017-12-01 DIAGNOSIS — J45909 Unspecified asthma, uncomplicated: Secondary | ICD-10-CM | POA: Diagnosis not present

## 2017-12-01 DIAGNOSIS — I10 Essential (primary) hypertension: Secondary | ICD-10-CM | POA: Diagnosis not present

## 2017-12-01 MED FILL — ALPRAZolam 0.5 MG TABS: 0.5 | 30 days supply | Qty: 30 | Fill #0

## 2017-12-01 MED FILL — PANTOPRAZOLE SOD DR 20 MG T: 20 | 90 days supply | Qty: 180 | Fill #0

## 2017-12-08 DIAGNOSIS — L820 Inflamed seborrheic keratosis: Secondary | ICD-10-CM | POA: Diagnosis not present

## 2017-12-08 DIAGNOSIS — L821 Other seborrheic keratosis: Secondary | ICD-10-CM | POA: Diagnosis not present

## 2017-12-08 DIAGNOSIS — L570 Actinic keratosis: Secondary | ICD-10-CM | POA: Diagnosis not present

## 2017-12-08 DIAGNOSIS — L812 Freckles: Secondary | ICD-10-CM | POA: Diagnosis not present

## 2017-12-08 DIAGNOSIS — D1801 Hemangioma of skin and subcutaneous tissue: Secondary | ICD-10-CM | POA: Diagnosis not present

## 2017-12-08 DIAGNOSIS — D225 Melanocytic nevi of trunk: Secondary | ICD-10-CM | POA: Diagnosis not present

## 2017-12-14 ENCOUNTER — Ambulatory Visit (AMBULATORY_SURGERY_CENTER): Payer: Self-pay

## 2017-12-14 ENCOUNTER — Other Ambulatory Visit: Payer: Self-pay

## 2017-12-14 VITALS — Ht 63.5 in | Wt 226.6 lb

## 2017-12-14 DIAGNOSIS — Z1211 Encounter for screening for malignant neoplasm of colon: Secondary | ICD-10-CM

## 2017-12-14 MED ORDER — NA SULFATE-K SULFATE-MG SULF 17.5-3.13-1.6 GM/177ML PO SOLN: 1.0000 | Freq: Once | ORAL | 0 refills | Status: AC

## 2017-12-14 MED FILL — SUPREP BOWEL PREP KIT: 17.5-3.13-1 | 1 days supply | Qty: 354 | Fill #0

## 2017-12-14 NOTE — Progress Notes (Signed)
Denies allergies to eggs or soy products. Denies complication of anesthesia or sedation. Denies use of weight loss medication. Denies use of O2.   Emmi instructions declined.  

## 2017-12-15 ENCOUNTER — Ambulatory Visit (HOSPITAL_COMMUNITY)
Admission: RE | Admit: 2017-12-15 | Discharge: 2017-12-15 | Disposition: A | Discharge status: Home / Self Care | Payer: 59 | Source: Ambulatory Visit | Attending: Family Medicine | Admitting: Family Medicine

## 2017-12-15 DIAGNOSIS — K760 Fatty (change of) liver, not elsewhere classified: Secondary | ICD-10-CM | POA: Diagnosis not present

## 2017-12-15 DIAGNOSIS — I880 Nonspecific mesenteric lymphadenitis: Secondary | ICD-10-CM | POA: Diagnosis not present

## 2017-12-15 DIAGNOSIS — R935 Abnormal findings on diagnostic imaging of other abdominal regions, including retroperitoneum: Secondary | ICD-10-CM | POA: Diagnosis not present

## 2017-12-15 DIAGNOSIS — I700 Atherosclerosis of aorta: Secondary | ICD-10-CM | POA: Insufficient documentation

## 2017-12-15 MED ORDER — IOPAMIDOL (ISOVUE-300) INJECTION 61%
INTRAVENOUS | Status: AC
  Filled 2017-12-15: qty 100

## 2017-12-15 MED ORDER — IOPAMIDOL (ISOVUE-300) INJECTION 61%
100.0000 mL | Freq: Once | INTRAVENOUS | Status: AC | PRN
  Administered 2017-12-15: 100 mL via INTRAVENOUS

## 2017-12-19 ENCOUNTER — Ambulatory Visit
Admission: RE | Admit: 2017-12-19 | Discharge: 2017-12-19 | Disposition: A | Discharge status: Home / Self Care | Payer: 59 | Source: Ambulatory Visit | Attending: Family Medicine | Admitting: Family Medicine

## 2017-12-19 DIAGNOSIS — Z139 Encounter for screening, unspecified: Secondary | ICD-10-CM

## 2017-12-19 DIAGNOSIS — Z1231 Encounter for screening mammogram for malignant neoplasm of breast: Secondary | ICD-10-CM | POA: Diagnosis not present

## 2017-12-20 ENCOUNTER — Other Ambulatory Visit: Payer: Self-pay | Admitting: Family Medicine

## 2017-12-20 DIAGNOSIS — R928 Other abnormal and inconclusive findings on diagnostic imaging of breast: Secondary | ICD-10-CM

## 2017-12-28 ENCOUNTER — Encounter: Payer: Self-pay | Admitting: Internal Medicine

## 2017-12-28 ENCOUNTER — Ambulatory Visit (AMBULATORY_SURGERY_CENTER): Payer: 59 | Admitting: Internal Medicine

## 2017-12-28 ENCOUNTER — Other Ambulatory Visit: Payer: Self-pay

## 2017-12-28 VITALS — BP 126/77 | HR 61 | Temp 97.3°F | Resp 17 | Ht 63.0 in | Wt 226.0 lb

## 2017-12-28 DIAGNOSIS — Z1211 Encounter for screening for malignant neoplasm of colon: Secondary | ICD-10-CM | POA: Diagnosis present

## 2017-12-28 DIAGNOSIS — R197 Diarrhea, unspecified: Secondary | ICD-10-CM | POA: Diagnosis not present

## 2017-12-28 MED ORDER — SODIUM CHLORIDE 0.9 % IV SOLN: 500.0000 mL | Freq: Once | INTRAVENOUS | Status: DC

## 2017-12-28 NOTE — Progress Notes (Signed)
Called to room to assist during endoscopic procedure.  Patient ID and intended procedure confirmed with present staff. Received instructions for my participation in the procedure from the performing physician.  

## 2017-12-28 NOTE — Op Note (Signed)
[place] Patient Name: Morgan Williams Procedure Date: 12/28/2017 9:37 AM MRN: [id number] Endoscopist: Docia Chuck. Henrene Pastor , MD Age: 62 Referring MD:  Date of Birth: 03-06-1956 Gender: Female Account #: [id number] Procedure:                Colonoscopy, with biopsies Indications:              Screening for colorectal malignant neoplasm,                            Incidental diarrhea noted Medicines:                Monitored Anesthesia Care Procedure:                Pre-Anesthesia Assessment:                           - Prior to the procedure, a History and Physical                            was performed, and patient medications and                            allergies were reviewed. The patient's tolerance of                            previous anesthesia was also reviewed. The risks                            and benefits of the procedure and the sedation                            options and risks were discussed with the patient.                            All questions were answered, and informed consent                            was obtained. Prior Anticoagulants: The patient has                            taken no previous anticoagulant or antiplatelet                            agents. ASA Grade Assessment: II - A patient with                            mild systemic disease. After reviewing the risks                            and benefits, the patient was deemed in                            satisfactory condition to undergo the procedure.  After obtaining informed consent, the colonoscope                            was passed under direct vision. Throughout the                            procedure, the patient's blood pressure, pulse, and                            oxygen saturations were monitored continuously. The                            Colonoscope was introduced through the anus and                            advanced to the the cecum,  identified by                            appendiceal orifice and ileocecal valve. The                            terminal ileum, ileocecal valve, appendiceal                            orifice, and rectum were photographed. The quality                            of the bowel preparation was excellent. The                            colonoscopy was performed without difficulty. The                            patient tolerated the procedure well. The bowel                            preparation used was SUPREP. Scope In: 9:45:16 AM Scope Out: 9:54:55 AM Scope Withdrawal Time: 0 hours 7 minutes 42 seconds  Total Procedure Duration: 0 hours 9 minutes 39 seconds  Findings:                 The terminal ileum appeared normal.                           The entire examined colon appeared normal on direct                            and retroflexion views. Biopsies for histology were                            taken with a cold forceps from the entire colon for                            evaluation of microscopic colitis. Complications:  No immediate complications. Estimated blood loss:                            None. Estimated Blood Loss:     Estimated blood loss: none. Impression:               - The examined portion of the ileum was normal.                           - The entire examined colon is normal on direct and                            retroflexion views. Recommendation:           - Repeat colonoscopy in 10 years for screening                            purposes.                           - Patient has a contact number available for                            emergencies. The signs and symptoms of potential                            delayed complications were discussed with the                            patient. Return to normal activities tomorrow.                            Written discharge instructions were provided to the                            patient.                            - Resume previous diet.                           - Continue present medications.                           - Await pathology results. Docia Chuck. Henrene Pastor, MD 12/28/2017 9:59:14 AM This report has been signed electronically.

## 2017-12-28 NOTE — Patient Instructions (Signed)
YOU HAD AN ENDOSCOPIC PROCEDURE TODAY AT THE [facility]:   Refer to the procedure report that was given to you for any specific questions about what was found during the examination.  If the procedure report does not answer your questions, please call your gastroenterologist to clarify.  If you requested that your care partner not be given the details of your procedure findings, then the procedure report has been included in a sealed envelope for you to review at your convenience later.  YOU SHOULD EXPECT: Some feelings of bloating in the abdomen. Passage of more gas than usual.  Walking can help get rid of the air that was put into your GI tract during the procedure and reduce the bloating. If you had a lower endoscopy (such as a colonoscopy or flexible sigmoidoscopy) you may notice spotting of blood in your stool or on the toilet paper. If you underwent a bowel prep for your procedure, you may not have a normal bowel movement for a few days.  Please Note:  You might notice some irritation and congestion in your nose or some drainage.  This is from the oxygen used during your procedure.  There is no need for concern and it should clear up in a day or so.  SYMPTOMS TO REPORT IMMEDIATELY:   Following lower endoscopy (colonoscopy or flexible sigmoidoscopy):  Excessive amounts of blood in the stool  Significant tenderness or worsening of abdominal pains  Swelling of the abdomen that is new, acute  Fever of 100F or higher  For urgent or emergent issues, a gastroenterologist can be reached at any hour by calling [phone].   DIET:  We do recommend a small meal at first, but then you may proceed to your regular diet.  Drink plenty of fluids but you should avoid alcoholic beverages for 24 hours.  ACTIVITY:  You should plan to take it easy for the rest of today and you should NOT DRIVE or use heavy machinery until tomorrow (because of the sedation medicines used during the test).     FOLLOW UP: Our staff will call the number listed on your records the next business day following your procedure to check on you and address any questions or concerns that you may have regarding the information given to you following your procedure. If we do not reach you, we will leave a message.  However, if you are feeling well and you are not experiencing any problems, there is no need to return our call.  We will assume that you have returned to your regular daily activities without incident.  If any biopsies were taken you will be contacted by phone or by letter within the next 1-3 weeks.  Please call us at [phone] if you have not heard about the biopsies in 3 weeks.    SIGNATURES/CONFIDENTIALITY: You and/or your care partner have signed paperwork which will be entered into your electronic medical record.  These signatures attest to the fact that that the information above on your After Visit Summary has been reviewed and is understood.  Full responsibility of the confidentiality of this discharge information lies with you and/or your care-partner. 

## 2017-12-28 NOTE — Progress Notes (Signed)
A and O x3. Report to RN. Tolerated MAC anesthesia well.

## 2017-12-29 ENCOUNTER — Ambulatory Visit
Admission: RE | Admit: 2017-12-29 | Discharge: 2017-12-29 | Disposition: A | Discharge status: Home / Self Care | Payer: 59 | Source: Ambulatory Visit | Attending: Family Medicine | Admitting: Family Medicine

## 2017-12-29 ENCOUNTER — Ambulatory Visit: Payer: 59

## 2017-12-29 ENCOUNTER — Telehealth: Payer: Self-pay | Admitting: *Deleted

## 2017-12-29 DIAGNOSIS — R928 Other abnormal and inconclusive findings on diagnostic imaging of breast: Secondary | ICD-10-CM | POA: Diagnosis not present

## 2017-12-29 NOTE — Telephone Encounter (Signed)
  Follow up Call-  Call back number 12/28/2017  Post procedure Call Back phone  # [phone]  Permission to leave phone message Yes  Some recent data might be hidden     Patient questions:  Do you have a fever, pain , or abdominal swelling? No. Pain Score  0 *  Have you tolerated food without any problems? Yes.    Have you been able to return to your normal activities? Yes.    Do you have any questions about your discharge instructions: Diet   No. Medications  No. Follow up visit  No.  Do you have questions or concerns about your Care? No.  Actions: * If pain score is 4 or above: No action needed, pain <4.

## 2018-01-03 ENCOUNTER — Other Ambulatory Visit: Payer: Self-pay | Admitting: Family Medicine

## 2018-01-03 DIAGNOSIS — R9389 Abnormal findings on diagnostic imaging of other specified body structures: Secondary | ICD-10-CM

## 2018-01-08 ENCOUNTER — Encounter: Payer: Self-pay | Admitting: Internal Medicine

## 2018-01-10 MED FILL — SIMVASTATIN 40 MG TABLET: 40 | 90 days supply | Qty: 90 | Fill #1

## 2018-01-10 MED FILL — LEVOTHYROXINE 88 MCG TABLET: 88 | 90 days supply | Qty: 90 | Fill #1

## 2018-01-10 MED FILL — HYDROCHLOROTHIAZIDE 12.5 MG: 12.5 | 90 days supply | Qty: 90 | Fill #0

## 2018-01-10 MED FILL — LOSARTAN POTASSIUM 50 MG TA: 50 | 90 days supply | Qty: 90 | Fill #1

## 2018-01-12 MED FILL — TRIAMCINOLONE 0.1% PASTE: 0.1 | 15 days supply | Qty: 5 | Fill #0

## 2018-01-15 ENCOUNTER — Ambulatory Visit
Admission: RE | Admit: 2018-01-15 | Discharge: 2018-01-15 | Disposition: A | Discharge status: Home / Self Care | Payer: 59 | Source: Ambulatory Visit | Attending: Family Medicine | Admitting: Family Medicine

## 2018-01-15 DIAGNOSIS — N281 Cyst of kidney, acquired: Secondary | ICD-10-CM | POA: Diagnosis not present

## 2018-01-15 DIAGNOSIS — R9389 Abnormal findings on diagnostic imaging of other specified body structures: Secondary | ICD-10-CM

## 2018-01-15 MED ORDER — GADOBENATE DIMEGLUMINE 529 MG/ML IV SOLN
20.0000 mL | Freq: Once | INTRAVENOUS | Status: AC | PRN
  Administered 2018-01-15: 20 mL via INTRAVENOUS

## 2018-02-07 MED FILL — OSELTAMIVIR PHOSPHATE 75 MG: 75 | 10 days supply | Qty: 10 | Fill #0

## 2018-02-15 ENCOUNTER — Ambulatory Visit: Admit: 2018-02-15 | Discharge: 2018-02-15 | Payer: MEDICAID | Attending: Family | Primary: Family Medicine

## 2018-02-15 DIAGNOSIS — J209 Acute bronchitis, unspecified: Secondary | ICD-10-CM

## 2018-02-15 MED ORDER — PREDNISONE 20 MG PO TABS
20 MG | ORAL_TABLET | Freq: Two times a day (BID) | ORAL | 0 refills | Status: AC
Start: 2018-02-15 — End: 2018-02-20

## 2018-02-15 MED ORDER — PSEUDOEPHEDRINE-GUAIFENESIN ER 60-600 MG PO TB12
60-600 MG | ORAL_TABLET | Freq: Two times a day (BID) | ORAL | 0 refills | Status: AC
Start: 2018-02-15 — End: 2018-02-25

## 2018-02-15 MED ORDER — FLUTICASONE PROPIONATE 50 MCG/ACT NA SUSP
50 MCG/ACT | Freq: Every day | NASAL | 0 refills | Status: DC
Start: 2018-02-15 — End: 2020-12-08

## 2018-02-15 MED FILL — PANTOPRAZOLE SOD DR 20 MG T: 20 | 90 days supply | Qty: 180 | Fill #1

## 2018-02-15 NOTE — Patient Instructions (Addendum)
Please follow up with your family doctor if your symptoms get worse or you develop fever or chills.     Honey as a cough suppressant:  The best proven cough suppressant is buckwheat honey. The benefit can likely be received from any type of honey, not just buckwheat honey. Take about 1 tablespoon of honey every 4-6 hours. Side effects include insomnia or jitteriness. For patients with diabetes, this can affect (increase) your blood sugars.Commercial cough medications have never been proven to work better than honey.    Patient Education        Bronchitis: Care Instructions  Your Care Instructions    Bronchitis is inflammation of the bronchial tubes, which carry air to the lungs. The tubes swell and produce mucus, or phlegm. The mucus and inflamed bronchial tubes make you cough. You may have trouble breathing.  Most cases of bronchitis are caused by viruses like those that cause colds. Antibiotics usually do not help and they may be harmful.  Bronchitis usually develops rapidly and lasts about 2 to 3 weeks in otherwise healthy people.  Follow-up care is a key part of your treatment and safety. Be sure to make and go to all appointments, and call your doctor if you are having problems. It's also a good idea to know your test results and keep a list of the medicines you take.  How can you care for yourself at home?  ?? Take all medicines exactly as prescribed. Call your doctor if you think you are having a problem with your medicine.  ?? Get some extra rest.  ?? Take an over-the-counter pain medicine, such as acetaminophen (Tylenol), ibuprofen (Advil, Motrin), or naproxen (Aleve) to reduce fever and relieve body aches. Read and follow all instructions on the label.  ?? Do not take two or more pain medicines at the same time unless the doctor told you to. Many pain medicines have acetaminophen, which is Tylenol. Too much acetaminophen (Tylenol) can be harmful.  ?? Take an over-the-counter cough medicine that contains  dextromethorphan to help quiet a dry, hacking cough so that you can sleep. Avoid cough medicines that have more than one active ingredient. Read and follow all instructions on the label.  ?? Breathe moist air from a humidifier, hot shower, or sink filled with hot water. The heat and moisture will thin mucus so you can cough it out.  ?? Do not smoke. Smoking can make bronchitis worse. If you need help quitting, talk to your doctor about stop-smoking programs and medicines. These can increase your chances of quitting for good.  When should you call for help?  Call 911 anytime you think you may need emergency care. For example, call if:  ?? ?? You have severe trouble breathing.   ??Call your doctor now or seek immediate medical care if:  ?? ?? You have new or worse trouble breathing.   ?? ?? You cough up dark brown or bloody mucus (sputum).   ?? ?? You have a new or higher fever.   ?? ?? You have a new rash.   ??Watch closely for changes in your health, and be sure to contact your doctor if:  ?? ?? You cough more deeply or more often, especially if you notice more mucus or a change in the color of your mucus.   ?? ?? You are not getting better as expected.   Where can you learn more?  Go to [email or web address] and sign in to your MyChart account. Enter H333 in  the Search Health Information box to learn more about "Bronchitis: Care Instructions."     If you do not have an account, please click on the "Sign Up Now" link.  Current as of: July 05, 2017  Content Version: 11.9  ?? 2006-2018 [employer]. Care instructions adapted under license by [facility]. If you have questions about a medical condition or this instruction, always ask your healthcare professional. [employer] disclaims any warranty or liability for your use of this information.         Patient Education        Viral Respiratory Infection: Care Instructions  Your Care Instructions    Viruses are very small organisms. They  grow in number after they enter your body. There are many types that cause different illnesses, such as colds and the mumps.  The symptoms of a viral respiratory infection often start quickly. They include a fever, sore throat, and runny nose. You may also just not feel well. Or you may not want to eat much.  Most viral respiratory infections are not serious. They usually get better with time and self-care.  Antibiotics are not used to treat a viral infection. That's because antibiotics will not help cure a viral illness. In some cases, antiviral medicine can help your body fight a serious viral infection.  Follow-up care is a key part of your treatment and safety. Be sure to make and go to all appointments, and call your doctor if you are having problems. It's also a good idea to know your test results and keep a list of the medicines you take.  How can you care for yourself at home?  ?? Rest as much as possible until you feel better.  ?? Be safe with medicines. Take your medicine exactly as prescribed. Call your doctor if you think you are having a problem with your medicine. You will get more details on the specific medicine your doctor prescribes.  ?? Take an over-the-counter pain medicine, such as acetaminophen (Tylenol), ibuprofen (Advil, Motrin), or naproxen (Aleve), as needed for pain and fever. Read and follow all instructions on the label. Do not give aspirin to anyone younger than 20. It has been linked to Reye syndrome, a serious illness.  ?? Drink plenty of fluids, enough so that your urine is light yellow or clear like water. Hot fluids, such as tea or soup, may help relieve congestion in your nose and throat. If you have kidney, heart, or liver disease and have to limit fluids, talk with your doctor before you increase the amount of fluids you drink.  ?? Try to clear mucus from your lungs by breathing deeply and coughing.  ?? Gargle with warm salt water once an hour. This can help reduce swelling and throat  pain. Use 1 teaspoon of salt mixed in 1 cup of warm water.  ?? Do not smoke or allow others to smoke around you. If you need help quitting, talk to your doctor about stop-smoking programs and medicines. These can increase your chances of quitting for good.  To avoid spreading the virus  ?? Cough or sneeze into a tissue. Then throw the tissue away.  ?? If you don't have a tissue, use your hand to cover your cough or sneeze. Then clean your hand. You can also cough into your sleeve.  ?? Wash your hands often. Use soap and warm water. Wash for 15 to 20 seconds each time.  ?? If you don't have soap and water near  you, you can clean your hands with alcohol wipes or gel.  When should you call for help?  Call your doctor now or seek immediate medical care if:  ?? ?? You have a new or higher fever.   ?? ?? Your fever lasts more than 48 hours.   ?? ?? You have trouble breathing.   ?? ?? You have a fever with a stiff neck or a severe headache.   ?? ?? You are sensitive to light.   ?? ?? You feel very sleepy or confused.   ??Watch closely for changes in your health, and be sure to contact your doctor if:  ?? ?? You do not get better as expected.   Where can you learn more?  Go to [email or web address] and sign in to your MyChart account. Enter [phone] in the Search Health Information box to learn more about "Viral Respiratory Infection: Care Instructions."     If you do not have an account, please click on the "Sign Up Now" link.  Current as of: July 05, 2017  Content Version: 11.9  ?? 2006-2018 [employer]. Care instructions adapted under license by [facility]. If you have questions about a medical condition or this instruction, always ask your healthcare professional. [employer] disclaims any warranty or liability for your use of this information.

## 2018-02-15 NOTE — Progress Notes (Signed)
[facility]  [facility]  [address]  [place] [address]  Dept: [phone]  Dept Fax: [phone]  Loc: [phone]   Subjective   Darrin Apodaca is a 62 y.o.yearold who presents to the office with the following complaint(s): Cough (EST PT: burning in chest for last 3 months, pt has been using an inhaler)     HPI:  Here for eval sxs over the past 3 months specifically her cough and her nasal congestion and drainage sxs started 7 days ago  Not getting better but staying the same.  + sinus drainage  + runny nose, stuffy nose  + ST  + PND  + cough - productive? No but feels she needs to cough something up  No dyspnea  No wheeze  + chest congestion  No ear sxs  No rash  No fever  No chills  Able to do usual activites - missed work? No  Nl appetite    Sick contacts: Works with elderly  Mgmt: albuterol inhaler 1-2/day  - h/o allergies  - h/o asthma  - h/o sinus dz/infection  Non-smoker    Patient denies fever or chills, states mostly cough for last 3 months. Patient works with elderly and teaches pilaties    Review of Systems   Constitutional: Negative for activity change, appetite change, chills and fever.   HENT: Positive for congestion, postnasal drip, rhinorrhea, sneezing and sore throat. Negative for ear pain, sinus pressure and sinus pain.    Eyes: Negative for discharge, redness and itching.   Respiratory: Positive for cough and chest tightness (congestion). Negative for shortness of breath.    Cardiovascular: Negative for chest pain.   Gastrointestinal: Negative for abdominal pain, constipation, diarrhea and nausea.   Allergic/Immunologic: Negative for environmental allergies, food allergies and immunocompromised state.   Neurological: Negative for weakness and headaches.      No Known Allergies  Current Outpatient Medications on File Prior to Visit   Medication Sig Dispense Refill   ??? VENTOLIN HFA 108 (90 Base) MCG/ACT inhaler INHALE 1 TO 2 PUFFS BY MOUTH EVERY 4 TO 6  HOURS AS NEEDED  1   ??? PREMARIN 0.625 MG/GM vaginal cream USE 1 GRAM VAGINALLY ONCE EACH WEEK.  3     No current facility-administered medications on file prior to visit.       There is no problem list on file for this patient.     Social History     Tobacco Use   ??? Smoking status: Never Smoker   ??? Smokeless tobacco: Never Used   Substance Use Topics   ??? Alcohol use: Not on file      Objective   BP 134/82    Pulse 77    Temp 97.7 ??F (36.5 ??C) (Oral)    Ht 5' 2.5" (1.588 m)    Wt 139 lb (63 kg)    SpO2 98%    BMI 25.02 kg/m??   Physical Exam   Constitutional: She is oriented to person, place, and time. She appears well-developed and well-nourished.   HENT:   Head: Normocephalic and atraumatic.   Right Ear: Hearing, tympanic membrane, external ear and ear canal normal.   Left Ear: Hearing, tympanic membrane, external ear and ear canal normal.   Nose: Mucosal edema and rhinorrhea present.   Mouth/Throat: Uvula is midline and mucous membranes are normal. Posterior oropharyngeal erythema present.   Neck: Normal range of motion. Neck supple.   Cardiovascular: Normal rate,  regular rhythm and normal heart sounds.   Pulmonary/Chest: Effort normal and breath sounds normal.   Musculoskeletal: Normal range of motion.   Lymphadenopathy:        Head (right side): No submandibular adenopathy present.        Head (left side): No submandibular adenopathy present.     She has no cervical adenopathy.   Neurological: She is alert and oriented to person, place, and time.   Skin: Skin is warm and dry.       Assessment/Plan   1. Acute bronchitis, unspecified organism  - predniSONE (DELTASONE) 20 MG tablet; Take 1 tablet by mouth 2 times daily for 5 days  Dispense: 10 tablet; Refill: 0    2. Viral URI  - fluticasone (FLONASE) 50 MCG/ACT nasal spray; 1 spray by Nasal route daily  Dispense: 1 Bottle; Refill: 0  - pseudoephedrine-guaiFENesin (MUCINEX D) 60-600 MG per extended release tablet; Take 1 tablet by mouth every 12 hours for 10 days   Dispense: 20 tablet; Refill: 0     Patient to continue to use her albuterol inhaler as needed. Plan is to control sxs and monitor. Informed her that if she were to experience worsening sxs or develops a fever or chills to f/u with PCP.     Differential diagnosis: pneumonia, URI, bronchitis, pertussis   Patient given educational materials - see patient instructions.  Discussed use, benefit, and side effects of prescribed medications.  All patient questions answered.  Pt voiced understanding and agrees with treatmentplan.Follow up as directed.    Elton SinMichelle Jennika Ringgold, APRN - NP  02/15/18  10:03 AM

## 2018-02-19 NOTE — Addendum Note (Signed)
Addended by: Elton SinSALAZAR, Mitsuru Dault on: 02/19/2018 06:37 PM     Modules accepted: Level of Service

## 2018-02-23 DIAGNOSIS — E878 Other disorders of electrolyte and fluid balance, not elsewhere classified: Secondary | ICD-10-CM | POA: Diagnosis not present

## 2018-03-06 ENCOUNTER — Ambulatory Visit: Admit: 2018-03-06 | Discharge: 2018-03-06 | Payer: MEDICAID | Attending: Family | Primary: Family Medicine

## 2018-03-06 DIAGNOSIS — R058 Other specified cough: Secondary | ICD-10-CM

## 2018-03-06 MED ORDER — FLUTICASONE PROPIONATE 50 MCG/ACT NA SUSP
50 MCG/ACT | Freq: Every day | NASAL | 0 refills | Status: DC
Start: 2018-03-06 — End: 2020-12-08

## 2018-03-06 MED ORDER — BENZONATATE 200 MG PO CAPS
200 MG | ORAL_CAPSULE | Freq: Three times a day (TID) | ORAL | 0 refills | Status: AC | PRN
Start: 2018-03-06 — End: 2018-03-13

## 2018-03-06 NOTE — Progress Notes (Addendum)
Subjective:     Patient: Tammy Bruce is a 62 y.o. female  Who presents with cough and wanting a chest xray.         HPI Dry cough x 3 months. Urgent care and prescribed Flonase, mucinex D, and prednisone. Did not take the prednisone on advice from retired physician. Returns with dry cough. PCP requesting CXR.     Review of Systems   Constitutional: Negative for activity change, appetite change, chills and fatigue.   HENT: Positive for postnasal drip. Negative for congestion, ear pain, facial swelling, sinus pressure, sore throat, trouble swallowing and voice change.    Eyes: Negative.    Respiratory: Positive for apnea, cough (dry hacky improved after last Urgent care visit), wheezing (rt posterior upper lobe, cleared after cough) and stridor. Negative for choking, chest tightness and shortness of breath.    Cardiovascular: Negative for chest pain, palpitations and leg swelling.   Gastrointestinal: Negative.    Endocrine: Negative.    Genitourinary: Negative.    Musculoskeletal: Negative.  Negative for neck pain and neck stiffness.   Allergic/Immunologic: Negative for environmental allergies, food allergies and immunocompromised state.   Neurological: Negative for dizziness, syncope, light-headedness and headaches.   Hematological: Negative for adenopathy. Does not bruise/bleed easily.   Psychiatric/Behavioral: Negative.         No Known Allergies  Current Outpatient Medications on File Prior to Visit   Medication Sig Dispense Refill   ??? VENTOLIN HFA 108 (90 Base) MCG/ACT inhaler INHALE 1 TO 2 PUFFS BY MOUTH EVERY 4 TO 6 HOURS AS NEEDED  1   ??? PREMARIN 0.625 MG/GM vaginal cream USE 1 GRAM VAGINALLY ONCE EACH WEEK.  3   ??? fluticasone (FLONASE) 50 MCG/ACT nasal spray 1 spray by Nasal route daily 1 Bottle 0   ??? predniSONE (DELTASONE) 10 MG tablet TAKE 2 TABLET BY MOUTH TWICE A DAY FOR 5 DAYS  0     No current facility-administered medications on file prior to visit.       No past medical history on file.   Social  History     Tobacco Use   ??? Smoking status: Never Smoker   ??? Smokeless tobacco: Never Used   Substance Use Topics   ??? Alcohol use: Not on file          Objective:     BP 122/82    Pulse 87    Temp 97.7 ??F (36.5 ??C) (Oral)    Ht 5' 2.5" (1.588 m)    Wt 137 lb (62.1 kg)    SpO2 99%    BMI 24.66 kg/m??     Physical Exam   Constitutional: She is oriented to person, place, and time. She appears well-developed and well-nourished.   HENT:   Head: Normocephalic.   Right Ear: External ear normal.   Left Ear: External ear normal.   Nose: Nose normal.   Mouth/Throat: Oropharynx is clear and moist.   Neck: Normal range of motion. Neck supple.   Cardiovascular: Normal rate, regular rhythm and normal heart sounds.   Pulmonary/Chest: Effort normal. No stridor. No respiratory distress. She has wheezes (rt upper posterior lobe, clears with cough). She has no rales. She exhibits no tenderness.   Abdominal: Soft. Bowel sounds are normal.   Musculoskeletal: Normal range of motion.   Lymphadenopathy:     She has no cervical adenopathy.   Neurological: She is alert and oriented to person, place, and time.   Skin: Skin is warm and dry.  Capillary refill takes less than 2 seconds.   Psychiatric: Her behavior is normal. Thought content normal.   Denies any anorexia. Weight loss, coughing up blood or shortness of breath.     Assessment      1. Cough on exercise    2. Cough, persistent    3. Exercise induced bronchospasm         Plan      1. Cough on exercise    2. Cough, persistent    3. Exercise induced bronchospasm  all diagnosis along with lung cancer explained and discussed in great detail.   Patient agrees with plan of care and needed follow up with PCP.   Nanine Means, APRN - CNP  03/06/18  6:51 PM      * Large amount of time spent with patient encouraging her 'what assessment says to me "  And not needing a CXR.   If symptoms do not improve, worsen, or new symptoms develop, see PCP for further evaluation.

## 2018-03-06 NOTE — Patient Instructions (Addendum)
Patient Education        Cough: Care Instructions  Your Care Instructions    A cough is your body's response to something that bothers your throat or airways. Many things can cause a cough. You might cough because of a cold or the flu, bronchitis, or asthma. Smoking, postnasal drip, allergies, and stomach acid that backs up into your throat also can cause coughs.  A cough is a symptom, not a disease. Most coughs stop when the cause, such as a cold, goes away. You can take a few steps at home to cough less and feel better.  Follow-up care is a key part of your treatment and safety. Be sure to make and go to all appointments, and call your doctor if you are having problems. It's also a good idea to know your test results and keep a list of the medicines you take.  How can you care for yourself at home?  ?? Drink lots of water and other fluids. This helps thin the mucus and soothes a dry or sore throat. Honey or lemon juice in hot water or tea may ease a dry cough.  ?? Take cough medicine as directed by your doctor.  ?? Prop up your head on pillows to help you breathe and ease a dry cough.  ?? Try cough drops to soothe a dry or sore throat. Cough drops don't stop a cough. Medicine-flavored cough drops are no better than candy-flavored drops or hard candy.  ?? Do not smoke. Avoid secondhand smoke. If you need help quitting, talk to your doctor about stop-smoking programs and medicines. These can increase your chances of quitting for good.  When should you call for help?  Call 911 anytime you think you may need emergency care. For example, call if:  ?? ?? You have severe trouble breathing.   ??Call your doctor now or seek immediate medical care if:  ?? ?? You cough up blood.   ?? ?? You have new or worse trouble breathing.   ?? ?? You have a new or higher fever.   ?? ?? You have a new rash.   ??Watch closely for changes in your health, and be sure to contact your doctor if:  ?? ?? You cough more deeply or more often, especially if you  notice more mucus or a change in the color of your mucus.   ?? ?? You have new symptoms, such as a sore throat, an earache, or sinus pain.   ?? ?? You do not get better as expected.   Where can you learn more?  Go to [email or web address] and sign in to your MyChart account. Enter D279 in the Search Health Information box to learn more about "Cough: Care Instructions."     If you do not have an account, please click on the "Sign Up Now" link.  Current as of: July 05, 2017  Content Version: 11.9  ?? 2006-2018 [employer]. Care instructions adapted under license by [facility]. If you have questions about a medical condition or this instruction, always ask your healthcare professional. [employer] disclaims any warranty or liability for your use of this information.         Patient Education      Patient Education        Cough: Care Instructions  Your Care Instructions    A cough is your body's response to something that bothers your throat or airways. Many things can cause a cough. You might cough because  of a cold or the flu, bronchitis, or asthma. Smoking, postnasal drip, allergies, and stomach acid that backs up into your throat also can cause coughs.  A cough is a symptom, not a disease. Most coughs stop when the cause, such as a cold, goes away. You can take a few steps at home to cough less and feel better.  Follow-up care is a key part of your treatment and safety. Be sure to make and go to all appointments, and call your doctor if you are having problems. It's also a good idea to know your test results and keep a list of the medicines you take.  How can you care for yourself at home?  ?? Drink lots of water and other fluids. This helps thin the mucus and soothes a dry or sore throat. Honey or lemon juice in hot water or tea may ease a dry cough.  ?? Take cough medicine as directed by your doctor.  ?? Prop up your head on pillows to help you breathe and ease a dry  cough.  ?? Try cough drops to soothe a dry or sore throat. Cough drops don't stop a cough. Medicine-flavored cough drops are no better than candy-flavored drops or hard candy.  ?? Do not smoke. Avoid secondhand smoke. If you need help quitting, talk to your doctor about stop-smoking programs and medicines. These can increase your chances of quitting for good.  When should you call for help?  Call 911 anytime you think you may need emergency care. For example, call if:  ?? ?? You have severe trouble breathing.   ??Call your doctor now or seek immediate medical care if:  ?? ?? You cough up blood.   ?? ?? You have new or worse trouble breathing.   ?? ?? You have a new or higher fever.   ?? ?? You have a new rash.   ??Watch closely for changes in your health, and be sure to contact your doctor if:  ?? ?? You cough more deeply or more often, especially if you notice more mucus or a change in the color of your mucus.   ?? ?? You have new symptoms, such as a sore throat, an earache, or sinus pain.   ?? ?? You do not get better as expected.   Where can you learn more?  Go to [email or web address] and sign in to your MyChart account. Enter D279 in the Search Health Information box to learn more about "Cough: Care Instructions."     If you do not have an account, please click on the "Sign Up Now" link.  Current as of: July 05, 2017  Content Version: 11.9  ?? 2006-2018 [employer]. Care instructions adapted under license by [facility]. If you have questions about a medical condition or this instruction, always ask your healthcare professional. [employer] disclaims any warranty or liability for your use of this information.

## 2018-03-07 NOTE — Telephone Encounter (Signed)
Ok to verify using my name and ID

## 2018-03-07 NOTE — Telephone Encounter (Signed)
Pharmacy called stating they are not able to verify Nanine Means as a provider under [place]. Per Cala Bradford given to pharmacy to verify prescription from [facility] APRN.

## 2018-03-09 DIAGNOSIS — M1712 Unilateral primary osteoarthritis, left knee: Secondary | ICD-10-CM | POA: Diagnosis not present

## 2018-03-09 DIAGNOSIS — M1990 Unspecified osteoarthritis, unspecified site: Secondary | ICD-10-CM | POA: Insufficient documentation

## 2018-04-05 DIAGNOSIS — H5213 Myopia, bilateral: Secondary | ICD-10-CM | POA: Diagnosis not present

## 2018-04-05 DIAGNOSIS — H40023 Open angle with borderline findings, high risk, bilateral: Secondary | ICD-10-CM | POA: Diagnosis not present

## 2018-04-05 DIAGNOSIS — H353131 Nonexudative age-related macular degeneration, bilateral, early dry stage: Secondary | ICD-10-CM | POA: Diagnosis not present

## 2018-04-05 DIAGNOSIS — M8588 Other specified disorders of bone density and structure, other site: Secondary | ICD-10-CM | POA: Diagnosis not present

## 2018-04-05 DIAGNOSIS — E119 Type 2 diabetes mellitus without complications: Secondary | ICD-10-CM | POA: Diagnosis not present

## 2018-04-05 DIAGNOSIS — H40053 Ocular hypertension, bilateral: Secondary | ICD-10-CM | POA: Diagnosis not present

## 2018-04-06 ENCOUNTER — Encounter: Payer: 59 | Attending: Family Medicine | Admitting: Registered"

## 2018-04-06 ENCOUNTER — Encounter: Payer: Self-pay | Admitting: Registered"

## 2018-04-06 DIAGNOSIS — N172 Acute kidney failure with medullary necrosis: Secondary | ICD-10-CM | POA: Diagnosis not present

## 2018-04-06 DIAGNOSIS — N182 Chronic kidney disease, stage 2 (mild): Secondary | ICD-10-CM | POA: Diagnosis not present

## 2018-04-06 DIAGNOSIS — Z713 Dietary counseling and surveillance: Secondary | ICD-10-CM | POA: Diagnosis not present

## 2018-04-06 DIAGNOSIS — E559 Vitamin D deficiency, unspecified: Secondary | ICD-10-CM | POA: Diagnosis not present

## 2018-04-06 DIAGNOSIS — M25569 Pain in unspecified knee: Secondary | ICD-10-CM | POA: Diagnosis not present

## 2018-04-06 DIAGNOSIS — N281 Cyst of kidney, acquired: Secondary | ICD-10-CM | POA: Diagnosis not present

## 2018-04-06 DIAGNOSIS — R7303 Prediabetes: Secondary | ICD-10-CM | POA: Diagnosis not present

## 2018-04-06 DIAGNOSIS — I129 Hypertensive chronic kidney disease with stage 1 through stage 4 chronic kidney disease, or unspecified chronic kidney disease: Secondary | ICD-10-CM | POA: Diagnosis not present

## 2018-04-06 NOTE — Progress Notes (Signed)
Medical Nutrition Therapy:  Appt start time: 0800 end time:  0900.  [facility] Employee Visit #1  Assessment:  Primary concerns today:  Pt states she is having a lot of health issues and would like to improve her diet to address blood sugar control, knee pain, sleep quality, lipid profile, improve bone health (has osteopenia?) etc. Patient also states she wants her weight to get below 200 lbs.   Pt reports history of vit D deficiency, 50,000 units treatments annually. Pt states she now taking OTC vit D 5,000 units regularly and vit level is in the middle of the reference range.  Pt states she doesn't like taking protonix because long term use is not good for bones, but states her ENT adivised her to continue taking it due to suspected reflux as the cause of vocal cord nodules. Pt states she has a history of hoarseness and has had Barrett's Esophagitis ruled out.  Pt states her sleep quality is poor due to pain and leg cramping causing restlessness. Pt states she gets massages on a regular basis.  Pt states she has been taking anti-inflammatory for years to manage back pain. Pt states she stopped anti-inflammatory medication due to effect on kidney function, takes tylenol now.  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 Pt states she does not have energy after work to exercise but could do it on days off.  Pt states [place] [place] to prepare for trip to [place] and was constantly walking during her 2-week vacation. Pt states having a goal helps to motivate her to exercise.   Pt states she prefers to eat late 8 pm and either goes out with friends, Educational psychologist OR [employer] OR [employer] or will go earlier 3x week with her mother. Pt states she averages 1 meal per week at home.  Pt states she eats when bored and will eat a lot (mentioned Cheetos). Pt is very interested in the [place] non-hunger eating class, but will wait  until it is offered in [place].  Preferred Learning Style:   No preference indicated   Learning Readiness:   Contemplating  Ready  MEDICATIONS: reviewed   DIETARY INTAKE:  24-hr recall:  B ( AM): [place] yogurt, banana OR egg white fritta and fruit   Snk (9-10 AM): candy at work L ( PM): sandwich, fruit, chips diet soda Snk ( PM): none D ( PM): [employer] (mother loves to go there) sandwich, street corn Snk ( PM): none OR sherbert OR cookie OR piece of chocolate Beverages: water (pt states not enough), diet soda 1-2 per day, occasionally coffee, 5 cal cranberry juice.   Usual physical activity: ADLs   Estimated energy needs: 1800 calories  Progress Towards Goal(s):  New goal.   Nutritional Diagnosis:  NI-1.5 Excessive energy intake As related to ignoring fullness cues, cleaning plate.  As evidenced by pt stated cues for deciding on portion size.    Intervention:  Nutrition Education. Discussed non-hunger eating. Discussed importance of adequate sleep to help set foundation of making other changes.  Plan: Pay attention to hunger and fullness with evening meal Consider using magnesium supplement in the evening which may help cramps and getting better sleep. Increasing fish oil may help with reducing inflammation If these don't improve your sleep consider melatonin  Teaching Method Utilized:  Visual Auditory  Handouts given during visit include:  none  Barriers to learning/adherence  to lifestyle change: none  Demonstrated degree of understanding via:  Teach Back   Monitoring/Evaluation:  Dietary intake, exercise, and body weight in 4 week(s).

## 2018-04-06 NOTE — Patient Instructions (Addendum)
Pay attention to hunger and fullness with evening meal Consider using magnesium supplement in the evening which may help cramps and getting better sleep. Increasing fish oil may help with reducing inflammation If these don't improve your sleep consider melatonin

## 2018-04-11 MED FILL — LEVOTHYROXINE 88 MCG TABLET: 88 | 90 days supply | Qty: 90 | Fill #0

## 2018-04-11 MED FILL — SIMVASTATIN 40 MG TABLET: 40 | 90 days supply | Qty: 90 | Fill #0

## 2018-04-11 MED FILL — HYDROCHLOROTHIAZIDE 12.5 MG: 12.5 | 90 days supply | Qty: 90 | Fill #0

## 2018-04-11 MED FILL — LOSARTAN POTASSIUM 50 MG TA: 50 | 90 days supply | Qty: 90 | Fill #0

## 2018-05-08 ENCOUNTER — Encounter: Payer: 59 | Attending: Family Medicine | Admitting: Registered"

## 2018-05-08 DIAGNOSIS — E559 Vitamin D deficiency, unspecified: Secondary | ICD-10-CM | POA: Diagnosis not present

## 2018-05-08 DIAGNOSIS — M25569 Pain in unspecified knee: Secondary | ICD-10-CM | POA: Diagnosis not present

## 2018-05-08 DIAGNOSIS — Z713 Dietary counseling and surveillance: Secondary | ICD-10-CM | POA: Insufficient documentation

## 2018-05-08 NOTE — Patient Instructions (Addendum)
Consider adding more beans to diet and other high magnesium foods to your diet. Consider drinking fewer fluids in the evening. Continue working out a plan with friends to go swimming. Exercise helps with sleep quality.  Continue with mindful eating and enjoying the eating experience

## 2018-05-08 NOTE — Progress Notes (Signed)
Medical Nutrition Therapy:  Appt start time: 0800 end time:  0830.  [facility] Visit #2  Assessment:  Primary concerns today:  Pt states she was recently in [place] visiting her daughter and did a lot of walking including stairs [place] and [place] the subway. Pt states she walked 12-20,000K steps per day while there. Patient states she ate out with her daughter and practiced mindful eating and ate smaller portion sizes and felt good about this behavior change.  Patient states she has not done a lot specifically to improve sleep and did not start the magnesium supplement because her kidney doctor said not to take over 400 mg Mg due to kidney issues. Even thought the magnesium supplement would be less than this, patient states she is being very careful with her kidneys. Pt states there is no problem with getting magnesium through the diet. Pt states she slept well while in [place] and thinks the exercise and being tired at night helped her sleep.  Patient states she is taking [place] [employer] fish oil and has increased her dose to 2,000 mg. Pt states her knees are hurting and not doing treadmill, but has been swimming 3 or 4 times and felt really good and plans to coordinate with friends to help keep her motivated to continue swimming. (Last visit patient reported she drinks diet soda daily)  Preferred Learning Style:   No preference indicated   Learning Readiness:   Contemplating  Ready  MEDICATIONS: reviewed   DIETARY INTAKE: not assessed this visit  24-hr recall:  B ( AM):  Snk (9-10 AM):  L ( PM):  Snk ( PM):  D ( PM):  Snk ( PM):  Beverages:   Usual physical activity: swimming 2-3 days per week   Estimated energy needs: 1800 calories  Progress Towards Goal(s):  New goal.   Nutritional Diagnosis:  NI-1.5 Excessive energy intake As related to ignoring fullness cues, cleaning plate.  As evidenced by pt stated cues for deciding on portion size.    Intervention:  Nutrition Education.  Discussed non-hunger eating. Discussed importance of adequate sleep to help set foundation of making other changes.  Plan: Consider adding more beans to diet and other high magnesium foods to your diet. Consider drinking fewer fluids in the evening. Continue working out a plan with friends to go swimming. Exercise helps with sleep quality.  Continue with mindful eating and enjoying the eating experience  Teaching Method Utilized:  Visual Auditory  Handouts given during visit include:  none  Barriers to learning/adherence to lifestyle change: none  Demonstrated degree of understanding via:  Teach Back   Monitoring/Evaluation:  Dietary intake, exercise, and body weight in 2 week(s).

## 2018-05-14 MED FILL — PANTOPRAZOLE SOD DR 20 MG T: 20 | 90 days supply | Qty: 180 | Fill #0

## 2018-05-17 DIAGNOSIS — H40021 Open angle with borderline findings, high risk, right eye: Secondary | ICD-10-CM | POA: Diagnosis not present

## 2018-05-17 DIAGNOSIS — H40051 Ocular hypertension, right eye: Secondary | ICD-10-CM | POA: Diagnosis not present

## 2018-05-17 MED FILL — PREDNISOLONE AC 1% EYE DROP: 1 | 25 days supply | Qty: 5 | Fill #0

## 2018-05-22 ENCOUNTER — Encounter: Payer: 59 | Admitting: Registered"

## 2018-05-22 DIAGNOSIS — Z713 Dietary counseling and surveillance: Secondary | ICD-10-CM

## 2018-05-22 DIAGNOSIS — E559 Vitamin D deficiency, unspecified: Secondary | ICD-10-CM | POA: Diagnosis not present

## 2018-05-22 DIAGNOSIS — M25569 Pain in unspecified knee: Secondary | ICD-10-CM | POA: Diagnosis not present

## 2018-05-22 NOTE — Patient Instructions (Addendum)
Omega 3 sources: walnuts, flax seed (ground and keep in fridge), chia seeds. You can try krill oil to see if that has less effect on acid reflux. You can check you blood sugar at structured times to investigate cause of high fasting numbers.  Consider cutting back on sweetened beverages Things that may help with sleep: Continue to work on getting regular exercise Aim to have lighter dinners Consider the sleep hygiene tips

## 2018-05-22 NOTE — Progress Notes (Signed)
Medical Nutrition Therapy:  Appt start time: 9675 end time:  1715.  [facility] Employee Visit #3  Assessment:  Primary concerns today:  Pt states since last visit she has been eating more beans, but may not as much as she should. Pt states she is drinking more water, less soda. Pt states she is sleeping a little better, but still gets gets up during night. Pt states she has been off work for 6 days, but still doesn't feel rested, patient states issues with being tired started with menopause (in 24s). Patient asked if blood sugar can affect energy and has had high FBG for many years, lately runs 108-120 mg/dL.  Pt [place] when [place]. Pt states she is back to just 1000 mg instead of 2000 mg of fish oil due to timing of taking other vitamins and doesn't want to take too close to bed due to side effect of reflux.  Preferred Learning Style:   No preference indicated   Learning Readiness:   Contemplating  Ready  MEDICATIONS: reviewed   DIETARY INTAKE: not assessed this visit  24-hr recall:  B ( AM): special K quiche, diet cranberry juice  Snk (9-10 AM): none L ( PM): [employer] kids meal chicken nuggets, diet coke, waffle fries Snk ( PM): none D (6-7:30 PM): bbq, slaw Snk ( PM): none Beverages: diet cranberry, water, diet soda, sweet tea  Usual physical activity: swimming   Estimated energy needs: 1800 calories  Progress Towards Goal(s):  New goal.   Nutritional Diagnosis:  NI-1.5 Excessive energy intake As related to ignoring fullness cues, cleaning plate.  As evidenced by pt stated cues for deciding on portion size.    Intervention:  Nutrition Education. Discussed morning high blood sugar causes. Reviewed importance of adequate sleep to help set foundation of making other changes. Discussed other sources of omega 3.  Plan: Omega 3 sources: walnuts, flax seed (ground and keep in fridge), chia seeds. You can try krill  oil to see if that has less effect on acid reflux. You can check you blood sugar at structured times to investigate cause of high fasting numbers.  Consider cutting back on sweetened beverages Things that may help with sleep: Continue to work on getting regular exercise Aim to have lighter dinners Consider the sleep hygiene tips  Teaching Method Utilized:  Visual Auditory  Handouts given during visit include:  Sleep hygiene  Morning high blood sugar  Barriers to learning/adherence to lifestyle change: none  Demonstrated degree of understanding via:  Teach Back   Monitoring/Evaluation:  Dietary intake, exercise, and body weight prn.

## 2018-06-05 DIAGNOSIS — H40022 Open angle with borderline findings, high risk, left eye: Secondary | ICD-10-CM | POA: Diagnosis not present

## 2018-06-05 DIAGNOSIS — H40052 Ocular hypertension, left eye: Secondary | ICD-10-CM | POA: Diagnosis not present

## 2018-06-07 MED FILL — metroNIDAZOLE 0.75 % LOTN: 0.75 | 90 days supply | Qty: 177 | Fill #0

## 2018-06-07 MED FILL — VENTOLIN HFA 90 MCG INHALER: 108 (90 BAS | 30 days supply | Qty: 18 | Fill #0

## 2018-07-05 MED FILL — VENTOLIN HFA 90 MCG INHALER: 108 (90 BAS | 30 days supply | Qty: 18 | Fill #1

## 2018-07-05 MED FILL — HYDROCHLOROTHIAZIDE 12.5 MG: 12.5 | 90 days supply | Qty: 90 | Fill #1

## 2018-07-05 MED FILL — SIMVASTATIN 40 MG TABLET: 40 | 90 days supply | Qty: 90 | Fill #1

## 2018-07-05 MED FILL — LOSARTAN POTASSIUM 50 MG TA: 50 | 90 days supply | Qty: 90 | Fill #1

## 2018-07-05 MED FILL — LEVOTHYROXINE 88 MCG TABLET: 88 | 90 days supply | Qty: 90 | Fill #1

## 2018-07-17 DIAGNOSIS — H40053 Ocular hypertension, bilateral: Secondary | ICD-10-CM | POA: Diagnosis not present

## 2018-07-17 DIAGNOSIS — H524 Presbyopia: Secondary | ICD-10-CM | POA: Diagnosis not present

## 2018-07-17 DIAGNOSIS — H40023 Open angle with borderline findings, high risk, bilateral: Secondary | ICD-10-CM | POA: Diagnosis not present

## 2018-08-09 ENCOUNTER — Encounter: Attending: Student in an Organized Health Care Education/Training Program | Primary: Family Medicine

## 2018-08-14 DIAGNOSIS — R7301 Impaired fasting glucose: Secondary | ICD-10-CM | POA: Diagnosis not present

## 2018-08-14 DIAGNOSIS — E559 Vitamin D deficiency, unspecified: Secondary | ICD-10-CM | POA: Diagnosis not present

## 2018-08-14 DIAGNOSIS — I10 Essential (primary) hypertension: Secondary | ICD-10-CM | POA: Diagnosis not present

## 2018-08-14 DIAGNOSIS — M1710 Unilateral primary osteoarthritis, unspecified knee: Secondary | ICD-10-CM | POA: Diagnosis not present

## 2018-08-14 DIAGNOSIS — E039 Hypothyroidism, unspecified: Secondary | ICD-10-CM | POA: Diagnosis not present

## 2018-08-14 DIAGNOSIS — E785 Hyperlipidemia, unspecified: Secondary | ICD-10-CM | POA: Diagnosis not present

## 2018-08-14 MED FILL — FREESTYLE LANCETS: 30 days supply | Qty: 100 | Fill #0

## 2018-08-14 MED FILL — predniSONE 10 MG TABS: 10 | 6 days supply | Qty: 21 | Fill #0

## 2018-08-14 MED FILL — FREESTYLE LITE METER: 30 days supply | Qty: 1 | Fill #0

## 2018-08-14 MED FILL — FREESTYLE LITE TEST STRIP: 90 days supply | Qty: 100 | Fill #0

## 2018-08-15 MED FILL — FAMOTIDINE 20 MG TABLET: 20 | 90 days supply | Qty: 90 | Fill #0

## 2018-08-16 MED FILL — PANTOPRAZOLE SOD DR 20 MG T: 20 | 90 days supply | Qty: 180 | Fill #1

## 2018-09-06 ENCOUNTER — Encounter (INDEPENDENT_AMBULATORY_CARE_PROVIDER_SITE_OTHER): Payer: 59

## 2018-09-06 DIAGNOSIS — M1712 Unilateral primary osteoarthritis, left knee: Secondary | ICD-10-CM | POA: Insufficient documentation

## 2018-09-20 ENCOUNTER — Ambulatory Visit (INDEPENDENT_AMBULATORY_CARE_PROVIDER_SITE_OTHER): Payer: 59 | Admitting: Family Medicine

## 2018-09-20 ENCOUNTER — Encounter (INDEPENDENT_AMBULATORY_CARE_PROVIDER_SITE_OTHER): Payer: Self-pay | Admitting: Family Medicine

## 2018-09-20 VITALS — BP 146/84 | HR 63 | Temp 98.5°F | Ht 63.0 in | Wt 219.0 lb

## 2018-09-20 DIAGNOSIS — E890 Postprocedural hypothyroidism: Secondary | ICD-10-CM | POA: Diagnosis not present

## 2018-09-20 DIAGNOSIS — E785 Hyperlipidemia, unspecified: Secondary | ICD-10-CM

## 2018-09-20 DIAGNOSIS — R0602 Shortness of breath: Secondary | ICD-10-CM | POA: Diagnosis not present

## 2018-09-20 DIAGNOSIS — E559 Vitamin D deficiency, unspecified: Secondary | ICD-10-CM | POA: Diagnosis not present

## 2018-09-20 DIAGNOSIS — Z1331 Encounter for screening for depression: Secondary | ICD-10-CM | POA: Diagnosis not present

## 2018-09-20 DIAGNOSIS — R739 Hyperglycemia, unspecified: Secondary | ICD-10-CM | POA: Diagnosis not present

## 2018-09-20 DIAGNOSIS — R5383 Other fatigue: Secondary | ICD-10-CM | POA: Diagnosis not present

## 2018-09-20 DIAGNOSIS — Z9189 Other specified personal risk factors, not elsewhere classified: Secondary | ICD-10-CM

## 2018-09-20 DIAGNOSIS — Z0289 Encounter for other administrative examinations: Secondary | ICD-10-CM

## 2018-09-20 DIAGNOSIS — Z6838 Body mass index (BMI) 38.0-38.9, adult: Secondary | ICD-10-CM

## 2018-09-21 LAB — COMPREHENSIVE METABOLIC PANEL
ALBUMIN: 4.8 g/dL (ref 3.6–4.8)
ALK PHOS: 70 IU/L (ref 39–117)
ALT: 18 IU/L (ref 0–32)
AST: 18 IU/L (ref 0–40)
Albumin/Globulin Ratio: 2 (ref 1.2–2.2)
BUN / CREAT RATIO: 22 (ref 12–28)
BUN: 19 mg/dL (ref 8–27)
Bilirubin Total: 0.6 mg/dL (ref 0.0–1.2)
CALCIUM: 9.9 mg/dL (ref 8.7–10.3)
CO2: 25 mmol/L (ref 20–29)
CREATININE: 0.87 mg/dL (ref 0.57–1.00)
Chloride: 96 mmol/L (ref 96–106)
GFR calc Af Amer: 84 mL/min/{1.73_m2} (ref >59)
GFR, EST NON AFRICAN AMERICAN: 73 mL/min/{1.73_m2} (ref >59)
GLOBULIN, TOTAL: 2.4 g/dL (ref 1.5–4.5)
GLUCOSE: 98 mg/dL (ref 65–99)
Potassium: 4.2 mmol/L (ref 3.5–5.2)
Sodium: 137 mmol/L (ref 134–144)
Total Protein: 7.2 g/dL (ref 6.0–8.5)

## 2018-09-21 LAB — CBC WITH DIFFERENTIAL/PLATELET
Basophils Absolute: 0 10*3/uL (ref 0.0–0.2)
Basos: 0 %
EOS (ABSOLUTE): 0.1 10*3/uL (ref 0.0–0.4)
EOS: 1 %
HEMATOCRIT: 38.8 % (ref 34.0–46.6)
HEMOGLOBIN: 13.2 g/dL (ref 11.1–15.9)
IMMATURE GRANULOCYTES: 1 %
Immature Grans (Abs): 0.1 10*3/uL (ref 0.0–0.1)
LYMPHS ABS: 2.6 10*3/uL (ref 0.7–3.1)
LYMPHS: 25 %
MCH: 30.8 pg (ref 26.6–33.0)
MCHC: 34 g/dL (ref 31.5–35.7)
MCV: 91 fL (ref 79–97)
MONOCYTES: 8 %
Monocytes Absolute: 0.9 10*3/uL (ref 0.1–0.9)
NEUTROS PCT: 65 %
Neutrophils Absolute: 7 10*3/uL (ref 1.4–7.0)
Platelets: 243 10*3/uL (ref 150–450)
RBC: 4.28 x10E6/uL (ref 3.77–5.28)
RDW: 12.4 % (ref 12.3–15.4)
WBC: 10.8 10*3/uL (ref 3.4–10.8)

## 2018-09-21 LAB — LIPID PANEL
CHOLESTEROL TOTAL: 174 mg/dL (ref 100–199)
Chol/HDL Ratio: 3.6 ratio (ref 0.0–4.4)
HDL: 49 mg/dL (ref >39)
LDL Calculated: 103 mg/dL — ABNORMAL HIGH (ref 0–99)
Triglycerides: 109 mg/dL (ref 0–149)
VLDL Cholesterol Cal: 22 mg/dL (ref 5–40)

## 2018-09-21 LAB — FOLATE

## 2018-09-21 LAB — HEMOGLOBIN A1C
Est. average glucose Bld gHb Est-mCnc: 134 mg/dL
HEMOGLOBIN A1C: 6.3 % — AB (ref 4.8–5.6)

## 2018-09-21 LAB — T4, FREE: FREE T4: 1.22 ng/dL (ref 0.82–1.77)

## 2018-09-21 LAB — T3: T3, Total: 56 ng/dL — ABNORMAL LOW (ref 71–180)

## 2018-09-21 LAB — INSULIN, RANDOM: INSULIN: 17.2 u[IU]/mL (ref 2.6–24.9)

## 2018-09-21 LAB — TSH: TSH: 3.48 u[IU]/mL (ref 0.450–4.500)

## 2018-09-21 LAB — VITAMIN D 25 HYDROXY (VIT D DEFICIENCY, FRACTURES): VIT D 25 HYDROXY: 60.2 ng/mL (ref 30.0–100.0)

## 2018-09-21 LAB — VITAMIN B12: Vitamin B-12: 563 pg/mL (ref 232–1245)

## 2018-09-25 NOTE — Progress Notes (Signed)
Office: [phone]  /  Fax: [phone]   Dear Dr. Drema Dallas,   Thank you for referring Morgan Williams to our clinic. The following note includes my evaluation and treatment recommendations.  HPI:   Chief Complaint: OBESITY    Morgan Williams has been referred by Leighton Ruff, MD for consultation regarding her obesity and obesity related comorbidities.    Morgan Williams (MR# [id number]) is a 62 y.o. female who presents on 09/25/2018 for obesity evaluation and treatment. Current BMI is Body mass index is 38.79 kg/m.Marland Kitchen Morgan Williams has been struggling with her weight for many years and has been unsuccessful in either losing weight, maintaining weight loss, or reaching her healthy weight goal.     Morgan Williams attended our information session and states she is currently in the action stage of change and ready to dedicate time achieving and maintaining a healthier weight. Morgan Williams is interested in becoming our patient and working on intensive lifestyle modifications including (but not limited to) diet, exercise and weight loss.    Morgan Williams states her family eats meals together her desired weight loss is 49 lbs. she has been heavy most of  her life she started gaining weight when her kids went to college her heaviest weight ever was 225 lbs. she has significant food cravings issues  she snacks frequently in the evenings she is frequently drinking liquids with calories she frequently makes poor food choices she has problems with excessive hunger  she frequently eats larger portions than normal  she has binge eating behaviors she struggles with emotional eating    Morgan Williams feels her energy is lower than it should be. This has worsened with weight gain and has not worsened recently. Tressie admits to daytime somnolence and she admits to waking up still tired. Patient is at risk for obstructive sleep apnea. Patent has a history of symptoms of daytime Morgan and morning Morgan. Patient generally gets 6 or  7 hours of sleep per night, and states they generally have restless sleep. Snoring is present. Apneic episodes are not present. Epworth Sleepiness Score is 8  Dyspnea on exertion Morgan Williams notes increasing shortness of breath with exercising and seems to be worsening over time with weight gain. She notes getting out of breath sooner with activity than she used to. This has not gotten worse recently. Morgan Williams denies orthopnea.  Hyperglycemia Morgan Williams has a history of pre-diabetes and she is not on metformin. She admits to polyphagia.  At risk for diabetes Morgan Williams is at higher than average risk for developing diabetes due to her obesity and hyperglycemia. She currently denies polyuria or polydipsia.  Vitamin D deficiency Morgan Williams has a diagnosis of vitamin D deficiency. She is currently taking OTC vit D 5,000 IU daily. She admits to Morgan and denies nausea, vomiting or muscle weakness.  Hypothyroid status post thyroidectomy Morgan Williams has a diagnosis of hypothyroidism. She is on 88 mcg of synthroid. She denies chest pain, hot or cold intolerance or palpitations.  Hyperlipidemia Morgan Williams has hyperlipidemia and she is on Simvastatin. Morgan Williams is attempting to improve her cholesterol levels with intensive lifestyle modification including a low saturated fat diet, exercise and weight loss. She denies any chest pain or myalgias.  Depression Screen Morgan Williams's Food and Mood (modified PHQ-9) score was  Depression screen PHQ 2/9 09/20/2018  Decreased Interest 1  Down, Depressed, Hopeless 1  PHQ - 2 Score 2  Altered sleeping 3  Tired, decreased energy 3  Change in appetite 1  Feeling bad or failure about yourself  1  Trouble concentrating 0  Suicidal thoughts 0  PHQ-9 Score 10  Difficult doing work/chores Not difficult at all    ALLERGIES: Allergies  Allergen Reactions  . Sulfa Antibiotics Rash    MEDICATIONS: Current Outpatient Medications on File Prior to Visit  Medication Sig Dispense Refill  .  acetaminophen (TYLENOL) 500 MG tablet Take 500 mg by mouth every 6 (six) hours as needed.    Marland Kitchen albuterol (VENTOLIN HFA) 108 (90 Base) MCG/ACT inhaler Inhale 1 puff into the lungs every 6 (six) hours as needed for wheezing or shortness of breath.    . ALPRAZolam (XANAX) 0.5 MG tablet Take 0.5 mg by mouth at bedtime as needed for anxiety.    . famotidine (PEPCID) 20 MG tablet Take 20 mg by mouth 2 (two) times daily.    . hydrochlorothiazide (MICROZIDE) 12.5 MG capsule Take 12.5 mg by mouth daily.    Marland Kitchen levothyroxine (SYNTHROID, LEVOTHROID) 88 MCG tablet Take 88 mcg by mouth daily before breakfast.    . losartan (COZAAR) 50 MG tablet Take 50 mg by mouth daily.    . metroNIDAZOLE (METROCREAM) 0.75 % cream Apply 1 application topically 2 (two) times daily.    . Multiple Vitamins-Minerals (MULTIVITAMIN WITH MINERALS) tablet Take 1 tablet by mouth daily.    . Multiple Vitamins-Minerals (PRESERVISION AREDS 2 PO) Take 1 tablet by mouth 2 (two) times daily.    . Omega-3 Fatty Acids (FISH OIL) 1000 MG CPDR Take by mouth.    . pantoprazole (PROTONIX) 20 MG tablet Take 20 mg by mouth 2 (two) times daily before a meal.    . simvastatin (ZOCOR) 40 MG tablet Take 40 mg by mouth daily.    . traMADol (ULTRAM) 50 MG tablet Take by mouth every 6 (six) hours as needed.    . TURMERIC PO Take 450 mg by mouth daily.    Marland Kitchen VITAMIN D, ERGOCALCIFEROL, PO Take 5,000 mg daily by mouth.     Current Facility-Administered Medications on File Prior to Visit  Medication Dose Route Frequency Provider Last Rate Last Dose  . 0.9 %  sodium chloride infusion  500 mL Intravenous Once Irene Shipper, MD        PAST MEDICAL HISTORY: Past Medical History:  Diagnosis Date  . Abdominal pain   . Allergy   . Anxiety   . Aortic atherosclerosis ([place])   . Arthritis   . Asthma   . Back pain   . Breast mass    left, turned out not to be a mass  . Depression   . Eczema   . Elevated blood sugar level   . Fatty liver   . GERD  (gastroesophageal reflux disease)   . Glaucoma   . Graves disease   . Heart murmur   . Hyperlipidemia   . Hypertension   . Hypertensive retinopathy   . Insomnia   . Kidney problem   . Obesity   . Osteoarthritis   . Osteopenia   . Prediabetes   . Rosacea   . Thyroid disease   . Vitamin D deficiency   . Vocal cord nodules     PAST SURGICAL HISTORY: Past Surgical History:  Procedure Laterality Date  . CESAREAN SECTION     x 1  . MICROLARYNGOSCOPY Left 11/06/2017   Procedure: MICRO DIRECT LARYNGOSCOPY REMOVAL OF VOCAL CORD;  Surgeon: Jodi Marble, MD;  Location: [facility];  Service: ENT;  Laterality: Left;  . THYROIDECTOMY      SOCIAL  HISTORY: Social History   Tobacco Use  . Smoking status: Former Smoker    Years: 25.00    Types: Cigarettes  . Smokeless tobacco: Never Used  . Tobacco comment: QUIT 15 YEARS AGO  Substance Use Topics  . Alcohol use: Yes    Alcohol/week: 1.0 standard drinks    Types: 1 Glasses of wine per week  . Drug use: No    FAMILY HISTORY: Family History  Problem Relation Age of Onset  . Hypertension Mother   . Hyperlipidemia Mother   . Diabetes Mother   . Thyroid disease Mother   . Hypertension Father   . Heart disease Father        CHF  . Colon cancer Father 71       stage 4  . Kidney disease Father   . Sleep apnea Father   . Obesity Father   . Thyroid disease Daughter   . Esophageal cancer Neg Hx   . Rectal cancer Neg Hx   . Liver cancer Neg Hx   . Pancreatic cancer Neg Hx   . Stomach cancer Neg Hx     ROS: Review of Systems  Constitutional: Positive for malaise/Morgan.  Eyes:       + Wear Glasses or Contacts  Respiratory: Positive for shortness of breath (on exertion).   Cardiovascular: Negative for chest pain, palpitations and orthopnea.       + Leg Cramping  Gastrointestinal: Positive for diarrhea and heartburn. Negative for nausea and vomiting.  Genitourinary: Negative for frequency.  Musculoskeletal:  Positive for back pain. Negative for myalgias.       + Muscle or Joint Pain Negative for muscle weakness  Endo/Heme/Allergies: Negative for polydipsia.       + Polyphagia Negative for hot or cold intolerance  Psychiatric/Behavioral: The patient has insomnia.     PHYSICAL EXAM: Blood pressure (!) 146/84, pulse 63, temperature 98.5 F (36.9 C), temperature source Oral, height 5\' 3"  (1.6 m), weight 219 lb (99.3 kg), SpO2 96 %. Body mass index is 38.79 kg/m. Physical Exam  Constitutional: She is oriented to person, place, and time. She appears well-developed and well-nourished.  HENT:  Head: Normocephalic and atraumatic.  Nose: Nose normal.  Eyes: EOM are normal. No scleral icterus.  Neck: Normal range of motion. Neck supple. No thyromegaly present.  Cardiovascular: Normal rate and regular rhythm.  Pulmonary/Chest: Effort normal. No respiratory distress.  Abdominal: Soft. There is no tenderness.  + Obesity  Musculoskeletal: Normal range of motion.  Range of Motion normal in all 4 extremities  Neurological: She is alert and oriented to person, place, and time. Coordination normal.  Skin: Skin is warm and dry.  Psychiatric: She has a normal mood and affect.  Vitals reviewed.   RECENT LABS AND TESTS: BMET    Component Value Date/Time   NA 137 09/20/2018 1050   K 4.2 09/20/2018 1050   CL 96 09/20/2018 1050   CO2 25 09/20/2018 1050   GLUCOSE 98 09/20/2018 1050   GLUCOSE 117 (H) 11/02/2017 1100   BUN 19 09/20/2018 1050   CREATININE 0.87 09/20/2018 1050   CALCIUM 9.9 09/20/2018 1050   GFRNONAA 73 09/20/2018 1050   GFRAA 84 09/20/2018 1050   Lab Results  Component Value Date   HGBA1C 6.3 (H) 09/20/2018   Lab Results  Component Value Date   INSULIN 17.2 09/20/2018   CBC    Component Value Date/Time   WBC 10.8 09/20/2018 1050   RBC 4.28 09/20/2018 1050  HGB 13.2 09/20/2018 1050   HCT 38.8 09/20/2018 1050   PLT 243 09/20/2018 1050   MCV 91 09/20/2018 1050   MCH  30.8 09/20/2018 1050   MCHC 34.0 09/20/2018 1050   RDW 12.4 09/20/2018 1050   LYMPHSABS 2.6 09/20/2018 1050   EOSABS 0.1 09/20/2018 1050   BASOSABS 0.0 09/20/2018 1050   Iron/TIBC/Ferritin/ %Sat No results found for: IRON, TIBC, FERRITIN, IRONPCTSAT Lipid Panel     Component Value Date/Time   CHOL 174 09/20/2018 1050   TRIG 109 09/20/2018 1050   HDL 49 09/20/2018 1050   CHOLHDL 3.6 09/20/2018 1050   LDLCALC 103 (H) 09/20/2018 1050   Hepatic Function Panel     Component Value Date/Time   PROT 7.2 09/20/2018 1050   ALBUMIN 4.8 09/20/2018 1050   AST 18 09/20/2018 1050   ALT 18 09/20/2018 1050   ALKPHOS 70 09/20/2018 1050   BILITOT 0.6 09/20/2018 1050      Component Value Date/Time   TSH 3.480 09/20/2018 1050    ECG  shows NSR with a rate of 61 BPM INDIRECT CALORIMETER done today shows a VO2 of 189 and a REE of 1320.  Her calculated basal metabolic rate is 8416 thus her basal metabolic rate is worse than expected.    ASSESSMENT AND PLAN: Other Morgan - Plan: EKG 12-Lead, CBC with Differential/Platelet, Comprehensive metabolic panel, Folate, Lipid panel, Vitamin B12  Shortness of breath on exertion  Hyperglycemia - Plan: Hemoglobin A1c, Insulin, random  Vitamin D deficiency - Plan: VITAMIN D 25 Hydroxy (Vit-D Deficiency, Fractures)  Postoperative hypothyroidism - Plan: T3, T4, free, TSH  Hyperlipidemia, unspecified hyperlipidemia type  At risk for diabetes mellitus  Screening for depression  Class 2 severe obesity with serious comorbidity and body mass index (BMI) of 38.0 to 38.9 in adult, unspecified obesity type (HCC)  PLAN: Morgan Morgan Williams was informed that her Morgan may be related to obesity, depression or many other causes. Labs will be ordered, and in the meanwhile Morgan Williams has agreed to work on diet, exercise and weight loss to help with Morgan. Proper sleep hygiene was discussed including the need for 7-8 hours of quality sleep each night. A sleep study  was not ordered based on symptoms and Epworth score.  Dyspnea on exertion Morgan Williams shortness of breath appears to be obesity related and exercise induced. She has agreed to work on weight loss and gradually increase exercise to treat her exercise induced shortness of breath. If Morgan Williams follows our instructions and loses weight without improvement of her shortness of breath, we will plan to refer to pulmonology. We will monitor this condition regularly. Morgan Williams agrees to this plan.  Hyperglycemia Fasting labs will be obtained and results with be discussed with Morgan Williams in 2 weeks at her follow up visit. In the meanwhile Morgan Williams was started on a lower simple carbohydrate diet and will work on weight loss efforts.  Diabetes risk counseling Morgan Williams was given extended (15 minutes) diabetes prevention counseling today. She is 62 y.o. female and has risk factors for diabetes including obesity and hyperglycemia. We discussed intensive lifestyle modifications today with an emphasis on weight loss as well as increasing exercise and decreasing simple carbohydrates in her diet.  Vitamin D Deficiency Morgan Williams was informed that low vitamin D levels contributes to Morgan and are associated with obesity, breast, and colon cancer. She will continue to take OTC Vit D @5 ,000 IU daily and will follow up for routine testing of vitamin D, at least 2-3 times per year. She  was informed of the risk of over-replacement of vitamin D and agrees to not increase her dose unless she discusses this with [name] first. We will check vitamin D level and Morgan Williams will follow up as directed.  Hypothyroid status post thyroidectomy Syniyah was informed of the importance of good thyroid control to help with weight loss efforts. She was also informed that supertheraputic thyroid levels are dangerous and will not improve weight loss results. We will check labs and Aissa will continue to take synthroid as prescribed.  Hyperlipidemia Lauran was informed of the  American Heart Association Guidelines emphasizing intensive lifestyle modifications as the first line treatment for hyperlipidemia. We discussed many lifestyle modifications today in depth, and Melaysia will start to work on decreasing saturated fats such as fatty red meat, butter and many fried foods. She will also increase vegetables and lean protein in her diet and start to work on exercise and weight loss efforts. We will check labs and Synda will continue Simvastatin as prescribed.  Depression Screen Rilyn had a moderately positive depression screening. Depression is commonly associated with obesity and often results in emotional eating behaviors. We will monitor this closely and work on CBT to help improve the non-hunger eating patterns. Referral to Psychology may be required if no improvement is seen as she continues in our clinic.  Obesity Floy is currently in the action stage of change and her goal is to continue with weight loss efforts. I recommend Keilani begin the structured treatment plan as follows:  She has agreed to follow the Category 2 plan Salina has been instructed to eventually work up to a goal of 150 minutes of combined cardio and strengthening exercise per week for weight loss and overall health benefits. We discussed the following Behavioral Modification Strategies today: increasing lean protein intake, decreasing simple carbohydrates, work on meal planning and easy cooking plans, holiday eating strategies and celebration eating strategies   She was informed of the importance of frequent follow up visits to maximize her success with intensive lifestyle modifications for her multiple health conditions. She was informed we would discuss her lab results at her next visit unless there is a critical issue that needs to be addressed sooner. Vlada agreed to keep her next visit at the agreed upon time to discuss these results.    OBESITY BEHAVIORAL INTERVENTION VISIT  Today's visit  was # 1   Starting weight: 219 lbs. Starting date: 09/20/2018 Today's weight : 219 lbs.  Today's date: 09/20/2018 Total lbs lost to date: 0   ASK: We discussed the diagnosis of obesity with Flonnie Overman today and Ebone agreed to give [name] permission to discuss obesity behavioral modification therapy today.  ASSESS: Yliana has the diagnosis of obesity and her BMI today is 38.8 Malashia is in the action stage of change   ADVISE: Serafina was educated on the multiple health risks of obesity as well as the benefit of weight loss to improve her health. She was advised of the need for long term treatment and the importance of lifestyle modifications to improve her current health and to decrease her risk of future health problems.  AGREE: Multiple dietary modification options and treatment options were discussed and  Kourtni agreed to follow the recommendations documented in the above note.  ARRANGE: Jayra was educated on the importance of frequent visits to treat obesity as outlined per CMS and USPSTF guidelines and agreed to schedule her next follow up appointment today.  Corey Skains, am acting as transcriptionist  for Dennard Nip, MD   I have reviewed the above documentation for accuracy and completeness, and I agree with the above. -Dennard Nip, MD

## 2018-10-01 MED FILL — LOSARTAN POTASSIUM 50 MG TA: 50 | 90 days supply | Qty: 90 | Fill #2

## 2018-10-01 MED FILL — LEVOTHYROXINE 88 MCG TABLET: 88 | 90 days supply | Qty: 90 | Fill #2

## 2018-10-01 MED FILL — HYDROCHLOROTHIAZIDE 12.5 MG: 12.5 | 90 days supply | Qty: 90 | Fill #2

## 2018-10-01 MED FILL — VENTOLIN HFA 90 MCG INHALER: 108 (90 BAS | 90 days supply | Qty: 18 | Fill #0

## 2018-10-01 MED FILL — metroNIDAZOLE 0.75 % LOTN: 0.75 | 90 days supply | Qty: 177 | Fill #1

## 2018-10-01 MED FILL — SIMVASTATIN 40 MG TABLET: 40 | 90 days supply | Qty: 90 | Fill #2

## 2018-10-02 ENCOUNTER — Encounter (INDEPENDENT_AMBULATORY_CARE_PROVIDER_SITE_OTHER): Payer: Self-pay | Admitting: Family Medicine

## 2018-10-04 ENCOUNTER — Ambulatory Visit (INDEPENDENT_AMBULATORY_CARE_PROVIDER_SITE_OTHER): Payer: 59 | Admitting: Family Medicine

## 2018-10-04 ENCOUNTER — Encounter (INDEPENDENT_AMBULATORY_CARE_PROVIDER_SITE_OTHER): Payer: Self-pay | Admitting: Family Medicine

## 2018-10-04 VITALS — BP 109/70 | HR 68 | Ht 63.0 in | Wt 213.0 lb

## 2018-10-04 DIAGNOSIS — R7303 Prediabetes: Secondary | ICD-10-CM | POA: Diagnosis not present

## 2018-10-04 DIAGNOSIS — H5213 Myopia, bilateral: Secondary | ICD-10-CM | POA: Diagnosis not present

## 2018-10-04 DIAGNOSIS — Z6837 Body mass index (BMI) 37.0-37.9, adult: Secondary | ICD-10-CM

## 2018-10-04 DIAGNOSIS — E038 Other specified hypothyroidism: Secondary | ICD-10-CM | POA: Diagnosis not present

## 2018-10-04 DIAGNOSIS — Z9189 Other specified personal risk factors, not elsewhere classified: Secondary | ICD-10-CM | POA: Diagnosis not present

## 2018-10-04 DIAGNOSIS — H40023 Open angle with borderline findings, high risk, bilateral: Secondary | ICD-10-CM | POA: Diagnosis not present

## 2018-10-04 MED ORDER — LEVOTHYROXINE SODIUM 100 MCG PO TABS: 100.0000 ug | ORAL_TABLET | Freq: Every day | ORAL | 0 refills | Status: DC

## 2018-10-04 MED ORDER — DULAGLUTIDE 0.75 MG/0.5ML ~~LOC~~ SOAJ: 0.7500 mg | SUBCUTANEOUS | 0 refills | Status: DC

## 2018-10-04 MED FILL — FAMOTIDINE 20 MG TABLET: 20 | 90 days supply | Qty: 180 | Fill #0

## 2018-10-04 MED FILL — LEVOTHYROXINE 100 MCG TABLE: 100 | 30 days supply | Qty: 30 | Fill #0

## 2018-10-08 ENCOUNTER — Encounter (INDEPENDENT_AMBULATORY_CARE_PROVIDER_SITE_OTHER): Payer: Self-pay

## 2018-10-09 NOTE — Progress Notes (Signed)
Office: [phone]  /  Fax: [phone]   HPI:   Chief Complaint: OBESITY Morgan Williams is here to discuss her progress with her obesity treatment plan. She is on the Category 2 plan and is following her eating plan approximately 50 to 60 % of the time. She states she is exercising 0 minutes 0 times per week. Deyjah did well with weight loss on her Category 2 plan, but struggled with hunger especially in the afternoon.  Her weight is 213 lb (96.6 kg) today and has had a weight loss of 6 pounds over a period of 2 weeks since her last visit. She has lost 6 lbs since starting treatment with [name].  Hypothyroid Morgan Williams has a diagnosis of hypothyroidism. She is on levothyroxine 5mcg and her TSH is over 2.5.  She admits fatigue and denies hot or cold intolerance, or palpitations.  Pre-Diabetes Morgan Williams has a diagnosis of pre-diabetes based on her elevated Hgb A1c and was informed this puts her at greater risk of developing diabetes. Her last A1c was elevated at 6.3 on 09/20/18. She admits significant polyphagia. She is not taking metformin currently and continues to work on diet and exercise to decrease risk of diabetes.   At risk for diabetes Morgan Williams is at higher than average risk for developing diabetes due to her pre-diabetes and obesity. She currently denies polyuria or polydipsia.  ALLERGIES: Allergies  Allergen Reactions  . Sulfa Antibiotics Rash    MEDICATIONS: Current Outpatient Medications on File Prior to Visit  Medication Sig Dispense Refill  . acetaminophen (TYLENOL) 500 MG tablet Take 500 mg by mouth every 6 (six) hours as needed.    Marland Kitchen albuterol (VENTOLIN HFA) 108 (90 Base) MCG/ACT inhaler Inhale 1 puff into the lungs every 6 (six) hours as needed for wheezing or shortness of breath.    . ALPRAZolam (XANAX) 0.5 MG tablet Take 0.5 mg by mouth at bedtime as needed for anxiety.    . famotidine (PEPCID) 20 MG tablet Take 20 mg by mouth 2 (two) times daily.    . hydrochlorothiazide (MICROZIDE)  12.5 MG capsule Take 12.5 mg by mouth daily.    Marland Kitchen levothyroxine (SYNTHROID, LEVOTHROID) 88 MCG tablet Take 88 mcg by mouth daily before breakfast.    . losartan (COZAAR) 50 MG tablet Take 50 mg by mouth daily.    . metroNIDAZOLE (METROCREAM) 0.75 % cream Apply 1 application topically 2 (two) times daily.    . Multiple Vitamins-Minerals (MULTIVITAMIN WITH MINERALS) tablet Take 1 tablet by mouth daily.    . Multiple Vitamins-Minerals (PRESERVISION AREDS 2 PO) Take 1 tablet by mouth 2 (two) times daily.    . Omega-3 Fatty Acids (FISH OIL) 1000 MG CPDR Take by mouth.    . pantoprazole (PROTONIX) 20 MG tablet Take 20 mg by mouth 2 (two) times daily before a meal.    . simvastatin (ZOCOR) 40 MG tablet Take 40 mg by mouth daily.    . traMADol (ULTRAM) 50 MG tablet Take by mouth every 6 (six) hours as needed.    . TURMERIC PO Take 450 mg by mouth daily.    Marland Kitchen VITAMIN D, ERGOCALCIFEROL, PO Take 5,000 mg daily by mouth.     Current Facility-Administered Medications on File Prior to Visit  Medication Dose Route Frequency Provider Last Rate Last Dose  . 0.9 %  sodium chloride infusion  500 mL Intravenous Once Irene Shipper, MD        PAST MEDICAL HISTORY: Past Medical History:  Diagnosis Date  .  Abdominal pain   . Allergy   . Anxiety   . Aortic atherosclerosis ([place])   . Arthritis   . Asthma   . Back pain   . Breast mass    left, turned out not to be a mass  . Depression   . Eczema   . Elevated blood sugar level   . Fatty liver   . GERD (gastroesophageal reflux disease)   . Glaucoma   . Graves disease   . Heart murmur   . Hyperlipidemia   . Hypertension   . Hypertensive retinopathy   . Insomnia   . Kidney problem   . Obesity   . Osteoarthritis   . Osteopenia   . Prediabetes   . Rosacea   . Thyroid disease   . Vitamin D deficiency   . Vocal cord nodules     PAST SURGICAL HISTORY: Past Surgical History:  Procedure Laterality Date  . CESAREAN SECTION     x 1  .  MICROLARYNGOSCOPY Left 11/06/2017   Procedure: MICRO DIRECT LARYNGOSCOPY REMOVAL OF VOCAL CORD;  Surgeon: Jodi Marble, MD;  Location: [facility];  Service: ENT;  Laterality: Left;  . THYROIDECTOMY      SOCIAL HISTORY: Social History   Tobacco Use  . Smoking status: Former Smoker    Years: 25.00    Types: Cigarettes  . Smokeless tobacco: Never Used  . Tobacco comment: QUIT 15 YEARS AGO  Substance Use Topics  . Alcohol use: Yes    Alcohol/week: 1.0 standard drinks    Types: 1 Glasses of wine per week  . Drug use: No    FAMILY HISTORY: Family History  Problem Relation Age of Onset  . Hypertension Mother   . Hyperlipidemia Mother   . Diabetes Mother   . Thyroid disease Mother   . Hypertension Father   . Heart disease Father        CHF  . Colon cancer Father 37       stage 4  . Kidney disease Father   . Sleep apnea Father   . Obesity Father   . Thyroid disease Daughter   . Esophageal cancer Neg Hx   . Rectal cancer Neg Hx   . Liver cancer Neg Hx   . Pancreatic cancer Neg Hx   . Stomach cancer Neg Hx     ROS: Review of Systems  Constitutional: Positive for malaise/fatigue and weight loss.  Genitourinary:       Negative for polyuria.  Endo/Heme/Allergies: Positive for polydipsia.       Positive for polyphagia. Negative for heat intolerance. Negative for cold intolerance.    PHYSICAL EXAM: Blood pressure 109/70, pulse 68, height 5\' 3"  (1.6 m), weight 213 lb (96.6 kg), SpO2 99 %. Body mass index is 37.73 kg/m. Physical Exam  Constitutional: She is oriented to person, place, and time. She appears well-developed and well-nourished.  Cardiovascular: Normal rate.  Pulmonary/Chest: Effort normal.  Musculoskeletal: Normal range of motion.  Neurological: She is oriented to person, place, and time.  Skin: Skin is warm and dry.  Psychiatric: She has a normal mood and affect. Her behavior is normal.  Vitals reviewed.   RECENT LABS AND TESTS: BMET     Component Value Date/Time   NA 137 09/20/2018 1050   K 4.2 09/20/2018 1050   CL 96 09/20/2018 1050   CO2 25 09/20/2018 1050   GLUCOSE 98 09/20/2018 1050   GLUCOSE 117 (H) 11/02/2017 1100   BUN 19 09/20/2018 1050  CREATININE 0.87 09/20/2018 1050   CALCIUM 9.9 09/20/2018 1050   GFRNONAA 73 09/20/2018 1050   GFRAA 84 09/20/2018 1050   Lab Results  Component Value Date   HGBA1C 6.3 (H) 09/20/2018   Lab Results  Component Value Date   INSULIN 17.2 09/20/2018   CBC    Component Value Date/Time   WBC 10.8 09/20/2018 1050   RBC 4.28 09/20/2018 1050   HGB 13.2 09/20/2018 1050   HCT 38.8 09/20/2018 1050   PLT 243 09/20/2018 1050   MCV 91 09/20/2018 1050   MCH 30.8 09/20/2018 1050   MCHC 34.0 09/20/2018 1050   RDW 12.4 09/20/2018 1050   LYMPHSABS 2.6 09/20/2018 1050   EOSABS 0.1 09/20/2018 1050   BASOSABS 0.0 09/20/2018 1050   Iron/TIBC/Ferritin/ %Sat No results found for: IRON, TIBC, FERRITIN, IRONPCTSAT Lipid Panel     Component Value Date/Time   CHOL 174 09/20/2018 1050   TRIG 109 09/20/2018 1050   HDL 49 09/20/2018 1050   CHOLHDL 3.6 09/20/2018 1050   LDLCALC 103 (H) 09/20/2018 1050   Hepatic Function Panel     Component Value Date/Time   PROT 7.2 09/20/2018 1050   ALBUMIN 4.8 09/20/2018 1050   AST 18 09/20/2018 1050   ALT 18 09/20/2018 1050   ALKPHOS 70 09/20/2018 1050   BILITOT 0.6 09/20/2018 1050      Component Value Date/Time   TSH 3.480 09/20/2018 1050   Results for ONEKA, PARADA (MRN [id number]) as of 10/09/2018 07:42  Ref. Range 09/20/2018 10:50  Vitamin D, 25-Hydroxy Latest Ref Range: 30.0 - 100.0 ng/mL 60.2   ASSESSMENT AND PLAN: Other specified hypothyroidism - Plan: levothyroxine (SYNTHROID, LEVOTHROID) 100 MCG tablet  Prediabetes - Plan: Dulaglutide (TRULICITY) 6.65 LD/3.5TS SOPN  At risk for diabetes mellitus  Class 2 severe obesity with serious comorbidity and body mass index (BMI) of 37.0 to 37.9 in adult, unspecified obesity type  [facility])  PLAN:  Hypothyroid Morgan Williams was informed of the importance of good thyroid control to help with weight loss efforts. She was also informed that supertherapeutic thyroid levels are dangerous and will not improve weight loss results. Morgan Williams has agreed to increase levothyroxine to 100 mcg on Monday, Wednesday, and Friday and to take 88 mcg on Tuesday, Thursday, Saturday, and Sunday. A prescription for levothyroxine 138mcg qd #30 with no refills was sent to the pharmacy. Morgan Williams agrees to follow up as directed in 2 weeks.  Pre-Diabetes Morgan Williams will continue to work on weight loss, exercise, and decreasing simple carbohydrates in her diet to help decrease the risk of diabetes. We discussed Trulicity including benefits and risks. She was informed that eating too many simple carbohydrates or too many calories at one sitting increases the likelihood of GI side effects. Morgan Williams agreed to start Trulicity 0.75mg  q week x 4 pens and a prescription was written today. Morgan Williams agreed to follow up with [name] as directed to monitor her progress.  Diabetes risk counseling Morgan Williams was given extended (30 minutes) diabetes prevention counseling today. She is 62 y.o. female and has risk factors for diabetes including pre-diabetes and obesity. We discussed intensive lifestyle modifications today with an emphasis on weight loss as well as increasing exercise and decreasing simple carbohydrates in her diet.  Obesity Morgan Williams is currently in the action stage of change. As such, her goal is to continue with weight loss efforts. She has agreed to follow the Category 2 plan. Morgan Williams has been instructed to work up to a goal of 150 minutes of combined  cardio and strengthening exercise per week for weight loss and overall health benefits. We discussed the following Behavioral Modification Strategies today: increasing lean protein intake, decreasing simple carbohydrates, work on meal planning and easy cooking plans, holiday eating strategies,  and travel eating strategies.  Morgan Williams has agreed to follow up with our clinic in 2 weeks. She was informed of the importance of frequent follow up visits to maximize her success with intensive lifestyle modifications for her multiple health conditions.   OBESITY BEHAVIORAL INTERVENTION VISIT  Today's visit was # 2   Starting weight: 219 lbs Starting date: 09/20/18 Today's weight : Weight: 213 lb (96.6 kg)  Today's date: 10/04/2018 Total lbs lost to date: 6  ASK: We discussed the diagnosis of obesity with Morgan Williams today and Morgan Williams agreed to give [name] permission to discuss obesity behavioral modification therapy today.  ASSESS: Morgan Williams has the diagnosis of obesity and her BMI today is 37.7. Morgan Williams is in the action stage of change.   ADVISE: Morgan Williams was educated on the multiple health risks of obesity as well as the benefit of weight loss to improve her health. She was advised of the need for long term treatment and the importance of lifestyle modifications to improve her current health and to decrease her risk of future health problems.  AGREE: Multiple dietary modification options and treatment options were discussed and Morgan Williams agreed to follow the recommendations documented in the above note.  ARRANGE: Peggye was educated on the importance of frequent visits to treat obesity as outlined per CMS and USPSTF guidelines and agreed to schedule her next follow up appointment today.  I, Marcille Blanco, am acting as transcriptionist for Starlyn Skeans, MD  I have reviewed the above documentation for accuracy and completeness, and I agree with the above. -Dennard Nip, MD

## 2018-10-15 NOTE — Progress Notes (Signed)
Cardiology Office Note   Date:  10/18/2018   ID:  Morgan Williams, DOB [birth year], MRN [id number]  PCP:  Leighton Ruff, MD  Cardiologist:   Jenkins Rouge, MD   No chief complaint on file.     History of Present Illness: Morgan Williams is a 62 y.o. female  First seen November 2018  regarding aortic atherosclerosis   Reviewed CT scan from 07/29/17 Abdomen done for "pain" Moderate atherosclerosis of aorta no aneurysm Concern as marker for  Concurrent CAD. CRF;s include HTN, HLD and DM-2  She also has mild asthma and previous Graves disease with surgical Rx.  Mom alive at 20 Father  died 56 had CHF late in life  Divorced with 2 children    She works at [employer] heavily until 16 years ago. Sedentary But no cardiac symptoms no chest pain, dyspnea palpitations TIA or claudication   Takes zocor for HLD and LDL 102 last month  A1c 6.3 TSH 3.4  09/20/18   November 26/18  TTE no valve disease normal EF screening tests normal ABI's no AAA calcium  Score 8 which was 43 rd percentile for age and sex    Had a wonderful trip to [place] with old highschool friends [place] of [place]   Past Medical History:  Diagnosis Date  . Abdominal pain   . Allergy   . Anxiety   . Aortic atherosclerosis ([place])   . Arthritis   . Asthma   . Back pain   . Breast mass    left, turned out not to be a mass  . Depression   . Eczema   . Elevated blood sugar level   . Fatty liver   . GERD (gastroesophageal reflux disease)   . Glaucoma   . Graves disease   . Heart murmur   . Hyperlipidemia   . Hypertension   . Hypertensive retinopathy   . Insomnia   . Kidney problem   . Obesity   . Osteoarthritis   . Osteopenia   . Prediabetes   . Rosacea   . Thyroid disease   . Vitamin D deficiency   . Vocal cord nodules     Past Surgical History:  Procedure Laterality Date  . CESAREAN SECTION     x 1  . MICROLARYNGOSCOPY Left 11/06/2017   Procedure: MICRO  DIRECT LARYNGOSCOPY REMOVAL OF VOCAL CORD;  Surgeon: Jodi Marble, MD;  Location: [facility];  Service: ENT;  Laterality: Left;  . THYROIDECTOMY       Current Outpatient Medications  Medication Sig Dispense Refill  . acetaminophen (TYLENOL) 500 MG tablet Take 500 mg by mouth every 6 (six) hours as needed.    Marland Kitchen albuterol (VENTOLIN HFA) 108 (90 Base) MCG/ACT inhaler Inhale 1 puff into the lungs every 6 (six) hours as needed for wheezing or shortness of breath.    . ALPRAZolam (XANAX) 0.5 MG tablet Take 0.5 mg by mouth at bedtime as needed for anxiety.    . Dulaglutide (TRULICITY) 4.12 IN/8.6VE SOPN Inject 0.75 mg into the skin every 7 (seven) days. 4 pen 0  . famotidine (PEPCID) 20 MG tablet Take 20 mg by mouth 2 (two) times daily.    . hydrochlorothiazide (MICROZIDE) 12.5 MG capsule Take 12.5 mg by mouth daily.    Marland Kitchen levothyroxine (SYNTHROID, LEVOTHROID) 100 MCG tablet Take 1 tablet (100 mcg total) by mouth daily. 30 tablet 0  . levothyroxine (SYNTHROID, LEVOTHROID) 88 MCG tablet Take  88 mcg by mouth daily before breakfast.    . losartan (COZAAR) 50 MG tablet Take 50 mg by mouth daily.    . metroNIDAZOLE (METROCREAM) 0.75 % cream Apply 1 application topically 2 (two) times daily.    . Multiple Vitamins-Minerals (MULTIVITAMIN WITH MINERALS) tablet Take 1 tablet by mouth daily.    . Multiple Vitamins-Minerals (PRESERVISION AREDS 2 PO) Take 1 tablet by mouth 2 (two) times daily.    . Omega-3 Fatty Acids (FISH OIL) 1000 MG CPDR Take by mouth.    . pantoprazole (PROTONIX) 20 MG tablet Take 20 mg by mouth 2 (two) times daily before a meal.    . simvastatin (ZOCOR) 40 MG tablet Take 40 mg by mouth daily.    . traMADol (ULTRAM) 50 MG tablet Take by mouth every 6 (six) hours as needed.    . TURMERIC PO Take 450 mg by mouth daily.    Marland Kitchen VITAMIN D, ERGOCALCIFEROL, PO Take 5,000 mg daily by mouth.     Current Facility-Administered Medications  Medication Dose Route Frequency Provider  Last Rate Last Dose  . 0.9 %  sodium chloride infusion  500 mL Intravenous Once Irene Shipper, MD        Allergies:   Sulfa antibiotics    Social History:  The patient  reports that she has quit smoking. Her smoking use included cigarettes. She quit after 25.00 years of use. She has never used smokeless tobacco. She reports current alcohol use of about 1.0 standard drinks of alcohol per week. She reports that she does not use drugs.   Family History:  The patient's family history includes Colon cancer (age of onset: 47) in her father; Diabetes in her mother; Heart disease in her father; Hyperlipidemia in her mother; Hypertension in her father and mother; Kidney disease in her father; Obesity in her father; Sleep apnea in her father; Thyroid disease in her daughter and mother.    ROS:  Please see the history of present illness.   Otherwise, review of systems are positive for none.   All other systems are reviewed and negative.    PHYSICAL EXAM: VS:  BP 112/70   Pulse 78   Ht 5\' 3"  (1.6 m)   Wt 213 lb 3.2 oz (96.7 kg)   SpO2 98%   BMI 37.77 kg/m  , BMI Body mass index is 37.77 kg/m. Affect appropriate Healthy:  appears stated age 6: normal Neck supple with no adenopathy JVP normal no bruits no thyromegaly Lungs clear with no wheezing and good diaphragmatic motion Heart:  S1/S2 SEM murmur, no rub, gallop or click PMI normal Abdomen: benighn, BS positve, no tenderness, no AAA no bruit.  No HSM or HJR Distal pulses intact with no bruits No edema Neuro non-focal Skin warm and dry No muscular weakness    EKG:  NSR rate 83 LVH inferior lateral T wave changes LVH   Recent Labs: 09/20/2018: ALT 18; BUN 19; Creatinine, Ser 0.87; Hemoglobin 13.2; Platelets 243; Potassium 4.2; Sodium 137; TSH 3.480    Lipid Panel    Component Value Date/Time   CHOL 174 09/20/2018 1050   TRIG 109 09/20/2018 1050   HDL 49 09/20/2018 1050   CHOLHDL 3.6 09/20/2018 1050   LDLCALC 103 (H)  09/20/2018 1050      Wt Readings from Last 3 Encounters:  10/18/18 213 lb 3.2 oz (96.7 kg)  10/04/18 213 lb (96.6 kg)  09/20/18 219 lb (99.3 kg)      Other studies Reviewed:  Additional studies/ records that were reviewed today include: Primary care notes CT abdomen, CXR and labs . TTE 09/25/17    ASSESSMENT AND PLAN:  1. Atherosclerosis likely from age and previous smoking Calcium score 8 continue ASA and statin   2. HTN continue diuretic and ARB 3. HLD given aortic atherosclerosis and calcium score 73 rd percentile for age   71. Thyroid continue replacement TSH with primary  5. Glucose Intolerance discussed low carb diet A1c with primary  6. Murmur:  SEM no valve disease on TTE 09/25/17  7. Abnormal ECG. Likely from BP TTE done 09/25/17 no LVH no valve disease EF 60-65%    Current medicines are reviewed at length with the patient today.  The patient does not have concerns regarding medicines.  The following changes have been made:  no change  Labs/ tests ordered today include:    No orders of the defined types were placed in this encounter.    Disposition:   FU with cardiology PRN     Signed, Jenkins Rouge, MD  10/18/2018 9:31 AM    [facility] [place], [place], [place]  [address] Phone: [phone]; Fax: [phone]

## 2018-10-18 ENCOUNTER — Encounter (INDEPENDENT_AMBULATORY_CARE_PROVIDER_SITE_OTHER): Payer: Self-pay | Admitting: Family Medicine

## 2018-10-18 ENCOUNTER — Ambulatory Visit (INDEPENDENT_AMBULATORY_CARE_PROVIDER_SITE_OTHER): Payer: 59 | Admitting: Cardiovascular Disease

## 2018-10-18 ENCOUNTER — Encounter: Payer: Self-pay | Admitting: Cardiovascular Disease

## 2018-10-18 ENCOUNTER — Ambulatory Visit (INDEPENDENT_AMBULATORY_CARE_PROVIDER_SITE_OTHER): Payer: 59 | Admitting: Family Medicine

## 2018-10-18 VITALS — BP 112/70 | HR 78 | Ht 63.0 in | Wt 213.2 lb

## 2018-10-18 VITALS — BP 124/78 | HR 73 | Ht 63.0 in | Wt 212.0 lb

## 2018-10-18 DIAGNOSIS — R7303 Prediabetes: Secondary | ICD-10-CM

## 2018-10-18 DIAGNOSIS — R011 Cardiac murmur, unspecified: Secondary | ICD-10-CM

## 2018-10-18 DIAGNOSIS — E785 Hyperlipidemia, unspecified: Secondary | ICD-10-CM | POA: Diagnosis not present

## 2018-10-18 DIAGNOSIS — I10 Essential (primary) hypertension: Secondary | ICD-10-CM

## 2018-10-18 DIAGNOSIS — M1712 Unilateral primary osteoarthritis, left knee: Secondary | ICD-10-CM | POA: Diagnosis not present

## 2018-10-18 DIAGNOSIS — Z9189 Other specified personal risk factors, not elsewhere classified: Secondary | ICD-10-CM

## 2018-10-18 DIAGNOSIS — Z6837 Body mass index (BMI) 37.0-37.9, adult: Secondary | ICD-10-CM | POA: Diagnosis not present

## 2018-10-18 DIAGNOSIS — M1711 Unilateral primary osteoarthritis, right knee: Secondary | ICD-10-CM | POA: Diagnosis not present

## 2018-10-18 MED ORDER — METFORMIN HCL 500 MG PO TABS: 500.0000 mg | ORAL_TABLET | Freq: Every day | ORAL | 0 refills | Status: DC

## 2018-10-18 MED FILL — metFORMIN HCL 500 MG TABS: 500 | 30 days supply | Qty: 30 | Fill #0

## 2018-10-18 MED FILL — METHOCARBAMOL 500 MG TABS: 500 | 40 days supply | Qty: 40 | Fill #0

## 2018-10-18 NOTE — Patient Instructions (Addendum)
Medication Instructions:   If you need a refill on your cardiac medications before your next appointment, please call your pharmacy.   Lab work:  If you have labs (blood work) drawn today and your tests are completely normal, you will receive your results only by: Marland Kitchen MyChart Message (if you have MyChart) OR . A paper copy in the mail If you have any lab test that is abnormal or we need to change your treatment, we will call you to review the results.  Testing/Procedures: NONE ordered today.  Follow-Up: At [facility], you and your health needs are our priority.  As part of our continuing mission to provide you with exceptional heart care, we have created designated Provider Care Teams.  These Care Teams include your primary Cardiologist (physician) and Advanced Practice Providers (APPs -  Physician Assistants and Nurse Practitioners) who all work together to provide you with the care you need, when you need it. Your physician recommends that you schedule a follow-up appointment as needed with Dr. Johnsie Cancel.

## 2018-10-22 NOTE — Progress Notes (Signed)
Office: [phone]  /  Fax: [phone]   HPI:   Chief Complaint: OBESITY Morgan Williams is here to discuss her progress with her obesity treatment plan. She is on the Category 2 plan and is following her eating plan approximately 75 % of the time. She states she is exercising 0 minutes 0 times per week. Morgan Williams continues to do well with weight loss on her plan. She notes decreased hunger, but is frustrated the she didn't lose more weight. She is weighing herself daily, which feeds into this frustration.  Her weight is 212 lb (96.2 kg) today and has had a weight loss of 1 pound over a period of 2 weeks since her last visit. She has lost 7 lbs since starting treatment with [name].  Pre-Diabetes Morgan Williams has a diagnosis of pre-diabetes based on her elevated Hgb A1c and was informed this puts her at greater risk of developing diabetes. She is not taking metformin currently and continues to work on diet and exercise to decrease risk of diabetes. Her insurance denied Trulicity and we reviewed metformin as a step edit.  At risk for diabetes Morgan Williams is at higher than average risk for developing diabetes due to her pre-diabetes and obesity. She currently denies polyuria or polydipsia.  ASSESSMENT AND PLAN:  Prediabetes - Plan: metFORMIN (GLUCOPHAGE) 500 MG tablet  At risk for diabetes mellitus  Class 2 severe obesity with serious comorbidity and body mass index (BMI) of 37.0 to 37.9 in adult, unspecified obesity type [facility])  PLAN:  Pre-Diabetes Morgan Williams will continue to work on weight loss, exercise, and decreasing simple carbohydrates in her diet to help decrease the risk of diabetes. We discussed metformin including benefits and risks. She was informed that eating too many simple carbohydrates or too many calories at one sitting increases the likelihood of GI side effects. Morgan Williams agreed to start metformin 500mg  qAM #30 with no refills and a prescription was written today. Morgan Williams agreed to follow up with [name] as  directed to monitor her progress in 3 to 4 weeks.  Diabetes risk counseling Morgan Williams was given extended (15 minutes) diabetes prevention counseling today. She is 62 y.o. female and has risk factors for diabetes including pre-diabetes and obesity. We discussed intensive lifestyle modifications today with an emphasis on weight loss as well as increasing exercise and decreasing simple carbohydrates in her diet.  Obesity Morgan Williams is currently in the action stage of change. As such, her goal is to continue with weight loss efforts. She has agreed to follow the Category 2 plan and will stop weighing more than once per week. Morgan Williams has been instructed to work up to a goal of 150 minutes of combined cardio and strengthening exercise per week for weight loss and overall health benefits. We discussed the following Behavioral Modification Strategies today: dealing with family or coworker sabotage, holiday eating strategies, and celebration eating strategies.  Morgan Williams has agreed to follow up with our clinic in 3 to 4 weeks. She was informed of the importance of frequent follow up visits to maximize her success with intensive lifestyle modifications for her multiple health conditions.  ALLERGIES: Allergies  Allergen Reactions  . Sulfa Antibiotics Rash    MEDICATIONS: Current Outpatient Medications on File Prior to Visit  Medication Sig Dispense Refill  . acetaminophen (TYLENOL) 500 MG tablet Take 500 mg by mouth every 6 (six) hours as needed.    Marland Kitchen albuterol (VENTOLIN HFA) 108 (90 Base) MCG/ACT inhaler Inhale 1 puff into the lungs every 6 (six) hours as needed  for wheezing or shortness of breath.    . ALPRAZolam (XANAX) 0.5 MG tablet Take 0.5 mg by mouth at bedtime as needed for anxiety.    . famotidine (PEPCID) 20 MG tablet Take 20 mg by mouth 2 (two) times daily.    . hydrochlorothiazide (MICROZIDE) 12.5 MG capsule Take 12.5 mg by mouth daily.    Marland Kitchen levothyroxine (SYNTHROID, LEVOTHROID) 100 MCG tablet Take 1  tablet (100 mcg total) by mouth daily. 30 tablet 0  . levothyroxine (SYNTHROID, LEVOTHROID) 88 MCG tablet Take 88 mcg by mouth daily before breakfast.    . losartan (COZAAR) 50 MG tablet Take 50 mg by mouth daily.    . metroNIDAZOLE (METROCREAM) 0.75 % cream Apply 1 application topically 2 (two) times daily.    . Multiple Vitamins-Minerals (MULTIVITAMIN WITH MINERALS) tablet Take 1 tablet by mouth daily.    . Multiple Vitamins-Minerals (PRESERVISION AREDS 2 PO) Take 1 tablet by mouth 2 (two) times daily.    . Omega-3 Fatty Acids (FISH OIL) 1000 MG CPDR Take by mouth.    . pantoprazole (PROTONIX) 20 MG tablet Take 20 mg by mouth 2 (two) times daily before a meal.    . simvastatin (ZOCOR) 40 MG tablet Take 40 mg by mouth daily.    . traMADol (ULTRAM) 50 MG tablet Take by mouth every 6 (six) hours as needed.    . TURMERIC PO Take 450 mg by mouth daily.    Marland Kitchen VITAMIN D, ERGOCALCIFEROL, PO Take 5,000 mg daily by mouth.     Current Facility-Administered Medications on File Prior to Visit  Medication Dose Route Frequency Provider Last Rate Last Dose  . 0.9 %  sodium chloride infusion  500 mL Intravenous Once Irene Shipper, MD        PAST MEDICAL HISTORY: Past Medical History:  Diagnosis Date  . Abdominal pain   . Allergy   . Anxiety   . Aortic atherosclerosis ([place])   . Arthritis   . Asthma   . Back pain   . Breast mass    left, turned out not to be a mass  . Depression   . Eczema   . Elevated blood sugar level   . Fatty liver   . GERD (gastroesophageal reflux disease)   . Glaucoma   . Graves disease   . Heart murmur   . Hyperlipidemia   . Hypertension   . Hypertensive retinopathy   . Insomnia   . Kidney problem   . Obesity   . Osteoarthritis   . Osteopenia   . Prediabetes   . Rosacea   . Thyroid disease   . Vitamin D deficiency   . Vocal cord nodules     PAST SURGICAL HISTORY: Past Surgical History:  Procedure Laterality Date  . CESAREAN SECTION     x 1  .  MICROLARYNGOSCOPY Left 11/06/2017   Procedure: MICRO DIRECT LARYNGOSCOPY REMOVAL OF VOCAL CORD;  Surgeon: Jodi Marble, MD;  Location: [place];  Service: ENT;  Laterality: Left;  . THYROIDECTOMY      SOCIAL HISTORY: Social History   Tobacco Use  . Smoking status: Former Smoker    Years: 25.00    Types: Cigarettes  . Smokeless tobacco: Never Used  . Tobacco comment: QUIT 15 YEARS AGO  Substance Use Topics  . Alcohol use: Yes    Alcohol/week: 1.0 standard drinks    Types: 1 Glasses of wine per week  . Drug use: No    FAMILY HISTORY:  Family History  Problem Relation Age of Onset  . Hypertension Mother   . Hyperlipidemia Mother   . Diabetes Mother   . Thyroid disease Mother   . Hypertension Father   . Heart disease Father        CHF  . Colon cancer Father 27       stage 4  . Kidney disease Father   . Sleep apnea Father   . Obesity Father   . Thyroid disease Daughter   . Esophageal cancer Neg Hx   . Rectal cancer Neg Hx   . Liver cancer Neg Hx   . Pancreatic cancer Neg Hx   . Stomach cancer Neg Hx     ROS: Review of Systems  Constitutional: Positive for weight loss.  Genitourinary:       Negative for polyuria.  Endo/Heme/Allergies: Negative for polydipsia.   PHYSICAL EXAM: Blood pressure 124/78, pulse 73, height 5\' 3"  (1.6 m), weight 212 lb (96.2 kg), SpO2 96 %. Body mass index is 37.55 kg/m. Physical Exam Vitals signs reviewed.  Constitutional:      Appearance: Normal appearance. She is obese.  Cardiovascular:     Rate and Rhythm: Normal rate.  Pulmonary:     Effort: Pulmonary effort is normal.  Musculoskeletal: Normal range of motion.  Skin:    General: Skin is warm and dry.  Neurological:     Mental Status: She is alert and oriented to person, place, and time.  Psychiatric:        Mood and Affect: Mood normal.        Behavior: Behavior normal.    RECENT LABS AND TESTS: BMET    Component Value Date/Time   NA 137 09/20/2018  1050   K 4.2 09/20/2018 1050   CL 96 09/20/2018 1050   CO2 25 09/20/2018 1050   GLUCOSE 98 09/20/2018 1050   GLUCOSE 117 (H) 11/02/2017 1100   BUN 19 09/20/2018 1050   CREATININE 0.87 09/20/2018 1050   CALCIUM 9.9 09/20/2018 1050   GFRNONAA 73 09/20/2018 1050   GFRAA 84 09/20/2018 1050   Lab Results  Component Value Date   HGBA1C 6.3 (H) 09/20/2018   Lab Results  Component Value Date   INSULIN 17.2 09/20/2018   CBC    Component Value Date/Time   WBC 10.8 09/20/2018 1050   RBC 4.28 09/20/2018 1050   HGB 13.2 09/20/2018 1050   HCT 38.8 09/20/2018 1050   PLT 243 09/20/2018 1050   MCV 91 09/20/2018 1050   MCH 30.8 09/20/2018 1050   MCHC 34.0 09/20/2018 1050   RDW 12.4 09/20/2018 1050   LYMPHSABS 2.6 09/20/2018 1050   EOSABS 0.1 09/20/2018 1050   BASOSABS 0.0 09/20/2018 1050   Iron/TIBC/Ferritin/ %Sat No results found for: IRON, TIBC, FERRITIN, IRONPCTSAT Lipid Panel     Component Value Date/Time   CHOL 174 09/20/2018 1050   TRIG 109 09/20/2018 1050   HDL 49 09/20/2018 1050   CHOLHDL 3.6 09/20/2018 1050   LDLCALC 103 (H) 09/20/2018 1050   Hepatic Function Panel     Component Value Date/Time   PROT 7.2 09/20/2018 1050   ALBUMIN 4.8 09/20/2018 1050   AST 18 09/20/2018 1050   ALT 18 09/20/2018 1050   ALKPHOS 70 09/20/2018 1050   BILITOT 0.6 09/20/2018 1050      Component Value Date/Time   TSH 3.480 09/20/2018 1050   Results for JALEEYA, MCNELLY (MRN [id number]) as of 10/22/2018 06:07  Ref. Range 09/20/2018 10:50  Vitamin D,  25-Hydroxy Latest Ref Range: 30.0 - 100.0 ng/mL 60.2    OBESITY BEHAVIORAL INTERVENTION VISIT  Today's visit was # 3   Starting weight: 219 lbs Starting date: 09/20/18 Today's weight : Weight: 212 lb (96.2 kg)  Today's date: 10/18/2018 Total lbs lost to date: 7  ASK: We discussed the diagnosis of obesity with Morgan Williams today and Morgan Williams agreed to give [name] permission to discuss obesity behavioral modification therapy  today.  ASSESS: Morgan Williams has the diagnosis of obesity and her BMI today is 37.5. Morgan Williams is in the action stage of change.   ADVISE: Morgan Williams was educated on the multiple health risks of obesity as well as the benefit of weight loss to improve her health. She was advised of the need for long term treatment and the importance of lifestyle modifications to improve her current health and to decrease her risk of future health problems.  AGREE: Multiple dietary modification options and treatment options were discussed and Morgan Williams agreed to follow the recommendations documented in the above note.  ARRANGE: Morgan Williams was educated on the importance of frequent visits to treat obesity as outlined per CMS and USPSTF guidelines and agreed to schedule her next follow up appointment today.  I, Marcille Blanco, am acting as transcriptionist for Starlyn Skeans, MD  I have reviewed the above documentation for accuracy and completeness, and I agree with the above. -Dennard Nip, MD

## 2018-11-15 ENCOUNTER — Ambulatory Visit (INDEPENDENT_AMBULATORY_CARE_PROVIDER_SITE_OTHER): Payer: 59 | Admitting: Family Medicine

## 2018-11-15 ENCOUNTER — Encounter (INDEPENDENT_AMBULATORY_CARE_PROVIDER_SITE_OTHER): Payer: Self-pay | Admitting: Family Medicine

## 2018-11-15 VITALS — BP 105/68 | HR 71 | Temp 97.9°F | Ht 63.0 in | Wt 208.0 lb

## 2018-11-15 DIAGNOSIS — E038 Other specified hypothyroidism: Secondary | ICD-10-CM | POA: Diagnosis not present

## 2018-11-15 DIAGNOSIS — Z6836 Body mass index (BMI) 36.0-36.9, adult: Secondary | ICD-10-CM | POA: Diagnosis not present

## 2018-11-15 DIAGNOSIS — Z9189 Other specified personal risk factors, not elsewhere classified: Secondary | ICD-10-CM | POA: Diagnosis not present

## 2018-11-15 DIAGNOSIS — R7303 Prediabetes: Secondary | ICD-10-CM

## 2018-11-15 MED ORDER — METFORMIN HCL 500 MG PO TABS: 500.0000 mg | ORAL_TABLET | Freq: Two times a day (BID) | ORAL | 0 refills | Status: DC

## 2018-11-15 MED ORDER — LEVOTHYROXINE SODIUM 100 MCG PO TABS: 100.0000 ug | ORAL_TABLET | Freq: Every day | ORAL | 0 refills | Status: DC

## 2018-11-15 MED ORDER — LEVOTHYROXINE SODIUM 88 MCG PO TABS: 88.0000 ug | ORAL_TABLET | Freq: Every day | ORAL | 0 refills | Status: DC

## 2018-11-15 MED FILL — metFORMIN HCL 500 MG TABS: 500 | 30 days supply | Qty: 60 | Fill #0

## 2018-11-15 MED FILL — LEVOTHYROXINE 100 MCG TABLE: 100 | 30 days supply | Qty: 30 | Fill #0

## 2018-11-16 LAB — T3: T3, Total: 70 ng/dL — ABNORMAL LOW (ref 71–180)

## 2018-11-16 LAB — TSH: TSH: 2.55 u[IU]/mL (ref 0.450–4.500)

## 2018-11-16 LAB — T4, FREE: FREE T4: 1.22 ng/dL (ref 0.82–1.77)

## 2018-11-19 ENCOUNTER — Other Ambulatory Visit: Payer: Self-pay | Admitting: Family Medicine

## 2018-11-19 DIAGNOSIS — Z1231 Encounter for screening mammogram for malignant neoplasm of breast: Secondary | ICD-10-CM

## 2018-11-19 NOTE — Progress Notes (Signed)
Office: [phone]  /  Fax: [phone]   HPI:   Chief Complaint: OBESITY Morgan Williams is here to discuss her progress with her obesity treatment plan. She is on the Category 2 plan and is following her eating plan approximately 60 % of the time. She states she is exercising 0 minutes 0 times per week. Morgan Williams has done well with weight loss even over the holidays. She notes increased evening hunger and had increased temptations over Christmas, but she has gotten rid of all the leftover holiday food.  Her weight is 208 lb (94.3 kg) today and has had a weight loss of 4 pounds over a period of 4 weeks since her last visit. She has lost 11 lbs since starting treatment with [name].  Hypothyroid Morgan Williams has a diagnosis of hypothyroidism. She is on Synthroid 165mcg 3 times per week and 59mcg 4 times per week. She is due for labs today. She denies hot or cold intolerance or palpitations.  Pre-Diabetes Morgan Williams has a diagnosis of pre-diabetes based on her elevated Hgb A1c and was informed this puts her at greater risk of developing diabetes. She is stable on metformin currently and doing well on her diet prescription, but she notes an increase in evening polyphagia. She continues to work on diet and exercise to decrease risk of diabetes.   At risk for diabetes Morgan Williams is at higher than average risk for developing diabetes due to her pre-diabetes and obesity. She currently denies polyuria or polydipsia.  ASSESSMENT AND PLAN:  Other specified hypothyroidism - Plan: T3, T4, free, TSH, levothyroxine (SYNTHROID, LEVOTHROID) 100 MCG tablet, levothyroxine (SYNTHROID, LEVOTHROID) 88 MCG tablet  Prediabetes - Plan: metFORMIN (GLUCOPHAGE) 500 MG tablet  At risk for diabetes mellitus  Class 2 severe obesity with serious comorbidity and body mass index (BMI) of 36.0 to 36.9 in adult, unspecified obesity type Morgan Williams)  PLAN:  Hypothyroid Morgan Williams was informed of the importance of good thyroid control to help with weight loss  efforts. She was also informed that supertherapeutic thyroid levels are dangerous and will not improve weight loss results. We will order labs today. Morgan Williams agrees to continue Synthroid 164mcg qd for 3 days a week #30 with no refills and Synthroid 65mcg qd for 4 days a week #30 with no refills. Morgan Williams agrees to follow up in 2 to 3 weeks.  Pre-Diabetes Morgan Williams will continue to work on weight loss, exercise, and decreasing simple carbohydrates in her diet to help decrease the risk of diabetes. We discussed metformin including benefits and risks. She was informed that eating too many simple carbohydrates or too many calories at one sitting increases the likelihood of GI side effects. Morgan Williams agrees to continue her diet and weight loss and increase metformin to 500mg  BID #60 with no refills and a prescription was written today. Morgan Williams agreed to follow up with [name] as directed to monitor her progress.  Diabetes risk counseling Morgan Williams was given extended (15 minutes) diabetes prevention counseling today. She is 63 y.o. female and has risk factors for diabetes including pre-diabetes and obesity. We discussed intensive lifestyle modifications today with an emphasis on weight loss as well as increasing exercise and decreasing simple carbohydrates in her diet.  Obesity Morgan Williams is currently in the action stage of change. As such, her goal is to continue with weight loss efforts. She has agreed to follow the Category 2 plan. Morgan Williams has been instructed to work up to a goal of 150 minutes of combined cardio and strengthening exercise per week for weight  loss and overall health benefits. We discussed the following Behavioral Modification Strategies today: increasing lean protein intake, work on meal planning and easy cooking plans, and celebration eating strategies.   Morgan Williams has agreed to follow up with our clinic in 2 to 3 weeks. She was informed of the importance of frequent follow up visits to maximize her success with  intensive lifestyle modifications for her multiple health conditions.  ALLERGIES: Allergies  Allergen Reactions  . Sulfa Antibiotics Rash   MEDICATIONS: Current Outpatient Medications on File Prior to Visit  Medication Sig Dispense Refill  . acetaminophen (TYLENOL) 500 MG tablet Take 500 mg by mouth every 6 (six) hours as needed.    Morgan Williams Kitchen albuterol (VENTOLIN HFA) 108 (90 Base) MCG/ACT inhaler Inhale 1 puff into the lungs every 6 (six) hours as needed for wheezing or shortness of breath.    . ALPRAZolam (XANAX) 0.5 MG tablet Take 0.5 mg by mouth at bedtime as needed for anxiety.    . famotidine (PEPCID) 20 MG tablet Take 20 mg by mouth 2 (two) times daily.    . hydrochlorothiazide (MICROZIDE) 12.5 MG capsule Take 12.5 mg by mouth daily.    Morgan Williams Kitchen losartan (COZAAR) 50 MG tablet Take 50 mg by mouth daily.    . metroNIDAZOLE (METROCREAM) 0.75 % cream Apply 1 application topically 2 (two) times daily.    . Multiple Vitamins-Minerals (MULTIVITAMIN WITH MINERALS) tablet Take 1 tablet by mouth daily.    . Multiple Vitamins-Minerals (PRESERVISION AREDS 2 PO) Take 1 tablet by mouth 2 (two) times daily.    . Omega-3 Fatty Acids (FISH OIL) 1000 MG CPDR Take by mouth.    . pantoprazole (PROTONIX) 20 MG tablet Take 20 mg by mouth 2 (two) times daily before a meal.    . simvastatin (ZOCOR) 40 MG tablet Take 40 mg by mouth daily.    . traMADol (ULTRAM) 50 MG tablet Take by mouth every 6 (six) hours as needed.    . TURMERIC PO Take 450 mg by mouth daily.    Morgan Williams Kitchen VITAMIN D, ERGOCALCIFEROL, PO Take 5,000 mg daily by mouth.     Current Facility-Administered Medications on File Prior to Visit  Medication Dose Route Frequency Provider Last Rate Last Dose  . 0.9 %  sodium chloride infusion  500 mL Intravenous Once Irene Shipper, MD       PAST MEDICAL HISTORY: Past Medical History:  Diagnosis Date  . Abdominal pain   . Allergy   . Anxiety   . Aortic atherosclerosis ([place])   . Arthritis   . Asthma   . Back pain     . Breast mass    left, turned out not to be a mass  . Depression   . Eczema   . Elevated blood sugar level   . Fatty liver   . GERD (gastroesophageal reflux disease)   . Glaucoma   . Graves disease   . Heart murmur   . Hyperlipidemia   . Hypertension   . Hypertensive retinopathy   . Insomnia   . Kidney problem   . Obesity   . Osteoarthritis   . Osteopenia   . Prediabetes   . Rosacea   . Thyroid disease   . Vitamin D deficiency   . Vocal cord nodules     PAST SURGICAL HISTORY: Past Surgical History:  Procedure Laterality Date  . CESAREAN SECTION     x 1  . MICROLARYNGOSCOPY Left 11/06/2017   Procedure: MICRO DIRECT LARYNGOSCOPY REMOVAL OF VOCAL  CORD;  Surgeon: Jodi Marble, MD;  Location: [place];  Service: ENT;  Laterality: Left;  . THYROIDECTOMY      SOCIAL HISTORY: Social History   Tobacco Use  . Smoking status: Former Smoker    Years: 25.00    Types: Cigarettes  . Smokeless tobacco: Never Used  . Tobacco comment: QUIT 15 YEARS AGO  Substance Use Topics  . Alcohol use: Yes    Alcohol/week: 1.0 standard drinks    Types: 1 Glasses of wine per week  . Drug use: No    FAMILY HISTORY: Family History  Problem Relation Age of Onset  . Hypertension Mother   . Hyperlipidemia Mother   . Diabetes Mother   . Thyroid disease Mother   . Hypertension Father   . Heart disease Father        CHF  . Colon cancer Father 53       stage 4  . Kidney disease Father   . Sleep apnea Father   . Obesity Father   . Thyroid disease Daughter   . Esophageal cancer Neg Hx   . Rectal cancer Neg Hx   . Liver cancer Neg Hx   . Pancreatic cancer Neg Hx   . Stomach cancer Neg Hx    ROS: Review of Systems  Constitutional: Positive for weight loss.  Cardiovascular: Negative for palpitations.  Genitourinary:       Negative for polyuria.  Endo/Heme/Allergies: Negative for polydipsia.       Negative for heat intolerance. Negative for cold  intolerance. Positive for polyphagia.   PHYSICAL EXAM: Blood pressure 105/68, pulse 71, temperature 97.9 F (36.6 C), temperature source Oral, height 5\' 3"  (1.6 m), weight 208 lb (94.3 kg), SpO2 97 %. Body mass index is 36.85 kg/m. Physical Exam Vitals signs reviewed.  Constitutional:      Appearance: Normal appearance. She is obese.  Cardiovascular:     Rate and Rhythm: Normal rate.  Pulmonary:     Effort: Pulmonary effort is normal.  Musculoskeletal: Normal range of motion.  Skin:    General: Skin is warm and dry.  Neurological:     Mental Status: She is alert and oriented to person, place, and time.  Psychiatric:        Mood and Affect: Mood normal.        Behavior: Behavior normal.    RECENT LABS AND TESTS: BMET    Component Value Date/Time   NA 137 09/20/2018 1050   K 4.2 09/20/2018 1050   CL 96 09/20/2018 1050   CO2 25 09/20/2018 1050   GLUCOSE 98 09/20/2018 1050   GLUCOSE 117 (H) 11/02/2017 1100   BUN 19 09/20/2018 1050   CREATININE 0.87 09/20/2018 1050   CALCIUM 9.9 09/20/2018 1050   GFRNONAA 73 09/20/2018 1050   GFRAA 84 09/20/2018 1050   Lab Results  Component Value Date   HGBA1C 6.3 (H) 09/20/2018   Lab Results  Component Value Date   INSULIN 17.2 09/20/2018   CBC    Component Value Date/Time   WBC 10.8 09/20/2018 1050   RBC 4.28 09/20/2018 1050   HGB 13.2 09/20/2018 1050   HCT 38.8 09/20/2018 1050   PLT 243 09/20/2018 1050   MCV 91 09/20/2018 1050   MCH 30.8 09/20/2018 1050   MCHC 34.0 09/20/2018 1050   RDW 12.4 09/20/2018 1050   LYMPHSABS 2.6 09/20/2018 1050   EOSABS 0.1 09/20/2018 1050   BASOSABS 0.0 09/20/2018 1050   Iron/TIBC/Ferritin/ %Sat No results  found for: IRON, TIBC, FERRITIN, IRONPCTSAT Lipid Panel     Component Value Date/Time   CHOL 174 09/20/2018 1050   TRIG 109 09/20/2018 1050   HDL 49 09/20/2018 1050   CHOLHDL 3.6 09/20/2018 1050   LDLCALC 103 (H) 09/20/2018 1050   Hepatic Function Panel     Component Value  Date/Time   PROT 7.2 09/20/2018 1050   ALBUMIN 4.8 09/20/2018 1050   AST 18 09/20/2018 1050   ALT 18 09/20/2018 1050   ALKPHOS 70 09/20/2018 1050   BILITOT 0.6 09/20/2018 1050      Component Value Date/Time   TSH 2.550 11/15/2018 1532   TSH 3.480 09/20/2018 1050   Results for SAANVIKA, VAZQUES (MRN [id number]) as of 11/19/2018 07:40  Ref. Range 09/20/2018 10:50  Vitamin D, 25-Hydroxy Latest Ref Range: 30.0 - 100.0 ng/mL 60.2    OBESITY BEHAVIORAL INTERVENTION VISIT  Today's visit was # 4   Starting weight: 219 lbs Starting date: 09/20/18 Today's weight : Weight: 208 lb (94.3 kg)  Today's date: 11/15/2018 Total lbs lost to date: 45  ASK: We discussed the diagnosis of obesity with Flonnie Overman today and Manuela Schwartz agreed to give [name] permission to discuss obesity behavioral modification therapy today.  ASSESS: Carolanne has the diagnosis of obesity and her BMI today is 36.8. Meghanne is in the action stage of change.   ADVISE: Betsaida was educated on the multiple health risks of obesity as well as the benefit of weight loss to improve her health. She was advised of the need for long term treatment and the importance of lifestyle modifications to improve her current health and to decrease her risk of future health problems.  AGREE: Multiple dietary modification options and treatment options were discussed and Calissa agreed to follow the recommendations documented in the above note.  ARRANGE: Madeline was educated on the importance of frequent visits to treat obesity as outlined per CMS and USPSTF guidelines and agreed to schedule her next follow up appointment today.  I, Marcille Blanco, am acting as transcriptionist for Starlyn Skeans, MD  I have reviewed the above documentation for accuracy and completeness, and I agree with the above. -Dennard Nip, MD

## 2018-12-03 ENCOUNTER — Ambulatory Visit (INDEPENDENT_AMBULATORY_CARE_PROVIDER_SITE_OTHER): Payer: 59 | Admitting: Physician Assistant

## 2018-12-03 ENCOUNTER — Encounter (INDEPENDENT_AMBULATORY_CARE_PROVIDER_SITE_OTHER): Payer: Self-pay | Admitting: Physician Assistant

## 2018-12-03 VITALS — BP 123/79 | HR 67 | Temp 97.9°F | Ht 63.0 in | Wt 205.0 lb

## 2018-12-03 DIAGNOSIS — R7303 Prediabetes: Secondary | ICD-10-CM

## 2018-12-03 DIAGNOSIS — Z6836 Body mass index (BMI) 36.0-36.9, adult: Secondary | ICD-10-CM | POA: Diagnosis not present

## 2018-12-03 NOTE — Progress Notes (Signed)
Office: [phone]  /  Fax: [phone]   HPI:   Chief Complaint: OBESITY Morgan Williams is here to discuss her progress with her obesity treatment plan. She is on the Category 2 plan and is following her eating plan approximately 50 % of the time. She states she is exercising 0 minutes 0 times per week. Morgan Williams did well with weight loss. She reports that she is having cravings, especially after dinner.  Her weight is 205 lb (93 kg) today and has had a weight loss of 3 pounds over a period of 2 to 3  weeks since her last visit. She has lost 14 lbs since starting treatment with [name].  Pre-Diabetes Morgan Williams has a diagnosis of prediabetes based on her elevated HgA1c and was informed this puts her at greater risk of developing diabetes. She is on metformin and denies nausea vomiting, or diarrhea. She reports polyphagia but is concerned about her kidneys. Last kidney function test was normal. She continues to work on diet and exercise to decrease risk of diabetes. She denies hypoglycemia.   ALLERGIES: Allergies  Allergen Reactions  . Sulfa Antibiotics Rash    MEDICATIONS: Current Outpatient Medications on File Prior to Visit  Medication Sig Dispense Refill  . acetaminophen (TYLENOL) 500 MG tablet Take 500 mg by mouth every 6 (six) hours as needed.    Marland Kitchen albuterol (VENTOLIN HFA) 108 (90 Base) MCG/ACT inhaler Inhale 1 puff into the lungs every 6 (six) hours as needed for wheezing or shortness of breath.    . ALPRAZolam (XANAX) 0.5 MG tablet Take 0.5 mg by mouth at bedtime as needed for anxiety.    . famotidine (PEPCID) 20 MG tablet Take 20 mg by mouth 2 (two) times daily.    . hydrochlorothiazide (MICROZIDE) 12.5 MG capsule Take 12.5 mg by mouth daily.    Marland Kitchen levothyroxine (SYNTHROID, LEVOTHROID) 100 MCG tablet Take 1 tablet (100 mcg total) by mouth daily. 30 tablet 0  . levothyroxine (SYNTHROID, LEVOTHROID) 88 MCG tablet Take 1 tablet (88 mcg total) by mouth daily before breakfast. 30 tablet 0  . losartan  (COZAAR) 50 MG tablet Take 50 mg by mouth daily.    . metFORMIN (GLUCOPHAGE) 500 MG tablet Take 1 tablet (500 mg total) by mouth 2 (two) times daily with a meal. 60 tablet 0  . metroNIDAZOLE (METROCREAM) 0.75 % cream Apply 1 application topically 2 (two) times daily.    . Multiple Vitamins-Minerals (MULTIVITAMIN WITH MINERALS) tablet Take 1 tablet by mouth daily.    . Multiple Vitamins-Minerals (PRESERVISION AREDS 2 PO) Take 1 tablet by mouth 2 (two) times daily.    . Omega-3 Fatty Acids (FISH OIL) 1000 MG CPDR Take by mouth.    . pantoprazole (PROTONIX) 20 MG tablet Take 20 mg by mouth 2 (two) times daily before a meal.    . simvastatin (ZOCOR) 40 MG tablet Take 40 mg by mouth daily.    . traMADol (ULTRAM) 50 MG tablet Take by mouth every 6 (six) hours as needed.    . TURMERIC PO Take 450 mg by mouth daily.    Marland Kitchen VITAMIN D, ERGOCALCIFEROL, PO Take 5,000 mg daily by mouth.     Current Facility-Administered Medications on File Prior to Visit  Medication Dose Route Frequency Provider Last Rate Last Dose  . 0.9 %  sodium chloride infusion  500 mL Intravenous Once Irene Shipper, MD        PAST MEDICAL HISTORY: Past Medical History:  Diagnosis Date  . Abdominal pain   .  Allergy   . Anxiety   . Aortic atherosclerosis ([place])   . Arthritis   . Asthma   . Back pain   . Breast mass    left, turned out not to be a mass  . Depression   . Eczema   . Elevated blood sugar level   . Fatty liver   . GERD (gastroesophageal reflux disease)   . Glaucoma   . Graves disease   . Heart murmur   . Hyperlipidemia   . Hypertension   . Hypertensive retinopathy   . Insomnia   . Kidney problem   . Obesity   . Osteoarthritis   . Osteopenia   . Prediabetes   . Rosacea   . Thyroid disease   . Vitamin D deficiency   . Vocal cord nodules     PAST SURGICAL HISTORY: Past Surgical History:  Procedure Laterality Date  . CESAREAN SECTION     x 1  . MICROLARYNGOSCOPY Left 11/06/2017   Procedure: MICRO  DIRECT LARYNGOSCOPY REMOVAL OF VOCAL CORD;  Surgeon: Jodi Marble, MD;  Location: [facility];  Service: ENT;  Laterality: Left;  . THYROIDECTOMY      SOCIAL HISTORY: Social History   Tobacco Use  . Smoking status: Former Smoker    Years: 25.00    Types: Cigarettes  . Smokeless tobacco: Never Used  . Tobacco comment: QUIT 15 YEARS AGO  Substance Use Topics  . Alcohol use: Yes    Alcohol/week: 1.0 standard drinks    Types: 1 Glasses of wine per week  . Drug use: No    FAMILY HISTORY: Family History  Problem Relation Age of Onset  . Hypertension Mother   . Hyperlipidemia Mother   . Diabetes Mother   . Thyroid disease Mother   . Hypertension Father   . Heart disease Father        CHF  . Colon cancer Father 77       stage 4  . Kidney disease Father   . Sleep apnea Father   . Obesity Father   . Thyroid disease Daughter   . Esophageal cancer Neg Hx   . Rectal cancer Neg Hx   . Liver cancer Neg Hx   . Pancreatic cancer Neg Hx   . Stomach cancer Neg Hx     ROS: Review of Systems  Constitutional: Positive for weight loss.  Gastrointestinal: Negative for diarrhea, nausea and vomiting.  Endo/Heme/Allergies:       Positive polyphagia Negative hypoglycemia    PHYSICAL EXAM: Blood pressure 123/79, pulse 67, temperature 97.9 F (36.6 C), temperature source Oral, height 5\' 3"  (1.6 m), weight 205 lb (93 kg), SpO2 99 %. Body mass index is 36.31 kg/m. Physical Exam Vitals signs reviewed.  Constitutional:      Appearance: Normal appearance. She is obese.  Cardiovascular:     Rate and Rhythm: Normal rate.     Pulses: Normal pulses.  Pulmonary:     Effort: Pulmonary effort is normal.     Breath sounds: Normal breath sounds.  Musculoskeletal: Normal range of motion.  Skin:    General: Skin is warm and dry.  Neurological:     Mental Status: She is alert and oriented to person, place, and time.  Psychiatric:        Mood and Affect: Mood normal.         Behavior: Behavior normal.     RECENT LABS AND TESTS: BMET    Component Value Date/Time  NA 137 09/20/2018 1050   K 4.2 09/20/2018 1050   CL 96 09/20/2018 1050   CO2 25 09/20/2018 1050   GLUCOSE 98 09/20/2018 1050   GLUCOSE 117 (H) 11/02/2017 1100   BUN 19 09/20/2018 1050   CREATININE 0.87 09/20/2018 1050   CALCIUM 9.9 09/20/2018 1050   GFRNONAA 73 09/20/2018 1050   GFRAA 84 09/20/2018 1050   Lab Results  Component Value Date   HGBA1C 6.3 (H) 09/20/2018   Lab Results  Component Value Date   INSULIN 17.2 09/20/2018   CBC    Component Value Date/Time   WBC 10.8 09/20/2018 1050   RBC 4.28 09/20/2018 1050   HGB 13.2 09/20/2018 1050   HCT 38.8 09/20/2018 1050   PLT 243 09/20/2018 1050   MCV 91 09/20/2018 1050   MCH 30.8 09/20/2018 1050   MCHC 34.0 09/20/2018 1050   RDW 12.4 09/20/2018 1050   LYMPHSABS 2.6 09/20/2018 1050   EOSABS 0.1 09/20/2018 1050   BASOSABS 0.0 09/20/2018 1050   Iron/TIBC/Ferritin/ %Sat No results found for: IRON, TIBC, FERRITIN, IRONPCTSAT Lipid Panel     Component Value Date/Time   CHOL 174 09/20/2018 1050   TRIG 109 09/20/2018 1050   HDL 49 09/20/2018 1050   CHOLHDL 3.6 09/20/2018 1050   LDLCALC 103 (H) 09/20/2018 1050   Hepatic Function Panel     Component Value Date/Time   PROT 7.2 09/20/2018 1050   ALBUMIN 4.8 09/20/2018 1050   AST 18 09/20/2018 1050   ALT 18 09/20/2018 1050   ALKPHOS 70 09/20/2018 1050   BILITOT 0.6 09/20/2018 1050      Component Value Date/Time   TSH 2.550 11/15/2018 1532   TSH 3.480 09/20/2018 1050    ASSESSMENT AND PLAN: Prediabetes  Class 2 severe obesity with serious comorbidity and body mass index (BMI) of 36.0 to 36.9 in adult, unspecified obesity type ([place])  PLAN:  Pre-Diabetes Morgan Williams will continue to work on weight loss, exercise, and decreasing simple carbohydrates in her diet to help decrease the risk of diabetes. We dicussed metformin including benefits and risks. She was informed that  eating too many simple carbohydrates or too many calories at one sitting increases the likelihood of GI side effects. Morgan Williams agrees to continue taking metformin and she agrees to follow up with our clinic in 2 weeks as directed to monitor her progress.   I spent > than 50% of the 15 minute visit on counseling as documented in the note.  Obesity  Morgan Williams is currently in the action stage of change. As such, her goal is to continue with weight loss efforts She has agreed to keep a food journal with 450 to 500  calories and 35 grams of protein at supper daily and follow the Category 2 plan. Morgan Williams has been instructed to work up to a goal of 150 minutes of combined cardio and strengthening exercise per week for weight loss and overall health benefits. We discussed the following Behavioral Modification Strategies today: work on meal planning and easy cooking plans and ways to avoid boredom eating.  Morgan Williams has agreed to follow up with our clinic in 2 weeks. She was informed of the importance of frequent follow up visits to maximize her success with intensive lifestyle modifications for her multiple health conditions.   OBESITY BEHAVIORAL INTERVENTION VISIT  Today's visit was # 5.  Starting weight: 219 lbs  Starting date: 09/20/2018 Today's weight : 205 lbs  Today's date: 12/03/2018 Total lbs lost to date: 97  ASK: We discussed the diagnosis of obesity with Morgan Williams today and Morgan Williams agreed to give [name] permission to discuss obesity behavioral modification therapy today.  ASSESS: Morgan Williams has the diagnosis of obesity and her BMI today is 36.32. Morgan Williams is in the action stage of change   ADVISE: Morgan Williams was educated on the multiple health risks of obesity as well as the benefit of weight loss to improve her health. She was advised of the need for long term treatment and the importance of lifestyle modifications.  AGREE: Multiple dietary modification options and treatment options were discussed and   Morgan Williams agreed to the above obesity treatment plan.  Morgan Williams, am acting as transcriptionist for Abby Potash, PA-C I, Abby Potash, PA-C have reviewed above note and agree with its content

## 2018-12-13 DIAGNOSIS — L821 Other seborrheic keratosis: Secondary | ICD-10-CM | POA: Diagnosis not present

## 2018-12-13 DIAGNOSIS — L812 Freckles: Secondary | ICD-10-CM | POA: Diagnosis not present

## 2018-12-13 DIAGNOSIS — L570 Actinic keratosis: Secondary | ICD-10-CM | POA: Diagnosis not present

## 2018-12-13 DIAGNOSIS — D1801 Hemangioma of skin and subcutaneous tissue: Secondary | ICD-10-CM | POA: Diagnosis not present

## 2018-12-13 DIAGNOSIS — L718 Other rosacea: Secondary | ICD-10-CM | POA: Diagnosis not present

## 2018-12-14 DIAGNOSIS — E039 Hypothyroidism, unspecified: Secondary | ICD-10-CM | POA: Diagnosis not present

## 2018-12-14 DIAGNOSIS — J45909 Unspecified asthma, uncomplicated: Secondary | ICD-10-CM | POA: Diagnosis not present

## 2018-12-14 DIAGNOSIS — I10 Essential (primary) hypertension: Secondary | ICD-10-CM | POA: Diagnosis not present

## 2018-12-14 DIAGNOSIS — Z0000 Encounter for general adult medical examination without abnormal findings: Secondary | ICD-10-CM | POA: Diagnosis not present

## 2018-12-14 DIAGNOSIS — E559 Vitamin D deficiency, unspecified: Secondary | ICD-10-CM | POA: Diagnosis not present

## 2018-12-14 DIAGNOSIS — R7303 Prediabetes: Secondary | ICD-10-CM | POA: Diagnosis not present

## 2018-12-14 DIAGNOSIS — E7800 Pure hypercholesterolemia, unspecified: Secondary | ICD-10-CM | POA: Diagnosis not present

## 2018-12-18 ENCOUNTER — Ambulatory Visit (INDEPENDENT_AMBULATORY_CARE_PROVIDER_SITE_OTHER): Payer: 59 | Admitting: Physician Assistant

## 2018-12-18 ENCOUNTER — Encounter (INDEPENDENT_AMBULATORY_CARE_PROVIDER_SITE_OTHER): Payer: Self-pay | Admitting: Physician Assistant

## 2018-12-18 VITALS — BP 113/76 | HR 69 | Temp 98.3°F | Ht 63.0 in | Wt 203.0 lb

## 2018-12-18 DIAGNOSIS — E038 Other specified hypothyroidism: Secondary | ICD-10-CM

## 2018-12-18 DIAGNOSIS — Z9189 Other specified personal risk factors, not elsewhere classified: Secondary | ICD-10-CM | POA: Diagnosis not present

## 2018-12-18 DIAGNOSIS — R7303 Prediabetes: Secondary | ICD-10-CM

## 2018-12-18 DIAGNOSIS — Z6836 Body mass index (BMI) 36.0-36.9, adult: Secondary | ICD-10-CM

## 2018-12-18 MED ORDER — METFORMIN HCL 500 MG PO TABS: 500.0000 mg | ORAL_TABLET | Freq: Two times a day (BID) | ORAL | 0 refills | Status: DC

## 2018-12-18 MED ORDER — LEVOTHYROXINE SODIUM 100 MCG PO TABS: 100.0000 ug | ORAL_TABLET | Freq: Every day | ORAL | 0 refills | Status: DC

## 2018-12-18 MED FILL — LEVOTHYROXINE 100 MCG TABLE: 100 | 30 days supply | Qty: 30 | Fill #0

## 2018-12-18 MED FILL — metFORMIN HCL 500 MG TABS: 500 | 30 days supply | Qty: 60 | Fill #0

## 2018-12-18 NOTE — Progress Notes (Signed)
Office: [phone]  /  Fax: [phone]   HPI:   Chief Complaint: OBESITY Morgan Williams is here to discuss her progress with her obesity treatment plan. She is on the Category 2 plan and is following her eating plan approximately 50% of the time. She states she is exercising 0 minutes 0 times per week. Morgan Williams did very well with weight loss. She reports that she is journaling all of her food for accountability but wants to continue with Category 2. She notes decreased cravings at night. Her weight is 203 lb (92.1 kg) today and has had a weight loss of 2 pounds over a period of 2 weeks since her last visit. She has lost 16 lbs since starting treatment with [name].  Hypothyroid Morgan Williams has a diagnosis of hypothyroidism and reports no excessive fatigue. She is currently on levothyroxine.   Pre-Diabetes Morgan Williams has a diagnosis of prediabetes based on her elevated HgA1c and was informed this puts her at greater risk of developing diabetes. She is taking metformin currently and continues to work on diet and exercise to decrease risk of diabetes. She denies nausea, vomiting, diarrhea, polyphagia, or hypoglycemia.  At risk for diabetes Morgan Williams is at higher than averagerisk for developing diabetes due to her obesity. She currently denies polyuria or polydipsia.  ASSESSMENT AND PLAN:  Other specified hypothyroidism - Plan: levothyroxine (SYNTHROID, LEVOTHROID) 100 MCG tablet  Prediabetes - Plan: metFORMIN (GLUCOPHAGE) 500 MG tablet  At risk for diabetes mellitus  Class 2 severe obesity with serious comorbidity and body mass index (BMI) of 36.0 to 36.9 in adult, unspecified obesity type [facility])  PLAN:  Hypothyroid Morgan Williams was informed of the importance of good thyroid control to help with weight loss efforts. She was also informed that supertheraputic thyroid levels are dangerous and will not improve weight loss results. Naveh will increase her levothyroxine to 100 mg PO #30 and will follow-up with our clinic in  2 weeks.  Pre-Diabetes Morgan Williams will continue to work on weight loss, exercise, and decreasing simple carbohydrates in her diet to help decrease the risk of diabetes. We dicussed metformin including benefits and risks. She was informed that eating too many simple carbohydrates or too many calories at one sitting increases the likelihood of GI side effects. Donnah was given a refill on metformin #60 with no refills. Morgan Williams agreed to follow-up with [name] as directed to monitor her progress.  Diabetes risk counseling Morgan Williams was given extended (15 minutes) diabetes prevention counseling today. She is 63 y.o. female and has risk factors for diabetes including obesity. We discussed intensive lifestyle modifications today with an emphasis on weight loss as well as increasing exercise and decreasing simple carbohydrates in her diet.  Obesity Morgan Williams is currently in the action stage of change. As such, her goal is to continue with weight loss efforts. She has agreed to follow the Category 2 plan. Morgan Williams has been instructed to work up to a goal of 150 minutes of combined cardio and strengthening exercise per week for weight loss and overall health benefits. We discussed the following Behavioral Modification Strategies today: keeping healthy foods in the house and planning for success.  Morgan Williams has agreed to follow-up with our clinic in 2 weeks. She was informed of the importance of frequent follow up visits to maximize her success with intensive lifestyle modifications for her multiple health conditions.  ALLERGIES: Allergies  Allergen Reactions  . Sulfa Antibiotics Rash    MEDICATIONS: Current Outpatient Medications on File Prior to Visit  Medication Sig Dispense  Refill  . acetaminophen (TYLENOL) 500 MG tablet Take 500 mg by mouth every 6 (six) hours as needed.    Marland Kitchen albuterol (VENTOLIN HFA) 108 (90 Base) MCG/ACT inhaler Inhale 1 puff into the lungs every 6 (six) hours as needed for wheezing or shortness of  breath.    . ALPRAZolam (XANAX) 0.5 MG tablet Take 0.5 mg by mouth at bedtime as needed for anxiety.    . famotidine (PEPCID) 20 MG tablet Take 20 mg by mouth 2 (two) times daily.    . hydrochlorothiazide (MICROZIDE) 12.5 MG capsule Take 12.5 mg by mouth daily.    Marland Kitchen levothyroxine (SYNTHROID, LEVOTHROID) 88 MCG tablet Take 1 tablet (88 mcg total) by mouth daily before breakfast. 30 tablet 0  . losartan (COZAAR) 50 MG tablet Take 50 mg by mouth daily.    . metroNIDAZOLE (METROCREAM) 0.75 % cream Apply 1 application topically 2 (two) times daily.    . Multiple Vitamins-Minerals (MULTIVITAMIN WITH MINERALS) tablet Take 1 tablet by mouth daily.    . Multiple Vitamins-Minerals (PRESERVISION AREDS 2 PO) Take 1 tablet by mouth 2 (two) times daily.    . Omega-3 Fatty Acids (FISH OIL) 1000 MG CPDR Take by mouth.    . pantoprazole (PROTONIX) 20 MG tablet Take 20 mg by mouth 2 (two) times daily before a meal.    . simvastatin (ZOCOR) 40 MG tablet Take 40 mg by mouth daily.    . traMADol (ULTRAM) 50 MG tablet Take by mouth every 6 (six) hours as needed.    . TURMERIC PO Take 450 mg by mouth daily.    Marland Kitchen VITAMIN D, ERGOCALCIFEROL, PO Take 5,000 mg daily by mouth.     Current Facility-Administered Medications on File Prior to Visit  Medication Dose Route Frequency Provider Last Rate Last Dose  . 0.9 %  sodium chloride infusion  500 mL Intravenous Once Irene Shipper, MD        PAST MEDICAL HISTORY: Past Medical History:  Diagnosis Date  . Abdominal pain   . Allergy   . Anxiety   . Aortic atherosclerosis ([place])   . Arthritis   . Asthma   . Back pain   . Breast mass    left, turned out not to be a mass  . Depression   . Eczema   . Elevated blood sugar level   . Fatty liver   . GERD (gastroesophageal reflux disease)   . Glaucoma   . Graves disease   . Heart murmur   . Hyperlipidemia   . Hypertension   . Hypertensive retinopathy   . Insomnia   . Kidney problem   . Obesity   . Osteoarthritis    . Osteopenia   . Prediabetes   . Rosacea   . Thyroid disease   . Vitamin D deficiency   . Vocal cord nodules     PAST SURGICAL HISTORY: Past Surgical History:  Procedure Laterality Date  . CESAREAN SECTION     x 1  . MICROLARYNGOSCOPY Left 11/06/2017   Procedure: MICRO DIRECT LARYNGOSCOPY REMOVAL OF VOCAL CORD;  Surgeon: Jodi Marble, MD;  Location: [facility];  Service: ENT;  Laterality: Left;  . THYROIDECTOMY      SOCIAL HISTORY: Social History   Tobacco Use  . Smoking status: Former Smoker    Years: 25.00    Types: Cigarettes  . Smokeless tobacco: Never Used  . Tobacco comment: QUIT 15 YEARS AGO  Substance Use Topics  . Alcohol use: Yes  Alcohol/week: 1.0 standard drinks    Types: 1 Glasses of wine per week  . Drug use: No    FAMILY HISTORY: Family History  Problem Relation Age of Onset  . Hypertension Mother   . Hyperlipidemia Mother   . Diabetes Mother   . Thyroid disease Mother   . Hypertension Father   . Heart disease Father        CHF  . Colon cancer Father 72       stage 4  . Kidney disease Father   . Sleep apnea Father   . Obesity Father   . Thyroid disease Daughter   . Esophageal cancer Neg Hx   . Rectal cancer Neg Hx   . Liver cancer Neg Hx   . Pancreatic cancer Neg Hx   . Stomach cancer Neg Hx    ROS: Review of Systems  Constitutional: Positive for weight loss. Negative for malaise/fatigue.  Gastrointestinal: Negative for diarrhea, nausea and vomiting.  Endo/Heme/Allergies:       Negative for polyphagia. Negative for hypoglycemia.   PHYSICAL EXAM: Blood pressure 113/76, pulse 69, temperature 98.3 F (36.8 C), temperature source Oral, height 5\' 3"  (1.6 m), weight 203 lb (92.1 kg), SpO2 98 %. Body mass index is 35.96 kg/m. Physical Exam Vitals signs reviewed.  Constitutional:      Appearance: Normal appearance. She is obese.  Cardiovascular:     Rate and Rhythm: Normal rate.     Pulses: Normal pulses.    Pulmonary:     Effort: Pulmonary effort is normal.     Breath sounds: Normal breath sounds.  Musculoskeletal: Normal range of motion.  Skin:    General: Skin is warm and dry.  Neurological:     Mental Status: She is alert and oriented to person, place, and time.  Psychiatric:        Behavior: Behavior normal.   RECENT LABS AND TESTS: BMET    Component Value Date/Time   NA 137 09/20/2018 1050   K 4.2 09/20/2018 1050   CL 96 09/20/2018 1050   CO2 25 09/20/2018 1050   GLUCOSE 98 09/20/2018 1050   GLUCOSE 117 (H) 11/02/2017 1100   BUN 19 09/20/2018 1050   CREATININE 0.87 09/20/2018 1050   CALCIUM 9.9 09/20/2018 1050   GFRNONAA 73 09/20/2018 1050   GFRAA 84 09/20/2018 1050   Lab Results  Component Value Date   HGBA1C 6.3 (H) 09/20/2018   Lab Results  Component Value Date   INSULIN 17.2 09/20/2018   CBC    Component Value Date/Time   WBC 10.8 09/20/2018 1050   RBC 4.28 09/20/2018 1050   HGB 13.2 09/20/2018 1050   HCT 38.8 09/20/2018 1050   PLT 243 09/20/2018 1050   MCV 91 09/20/2018 1050   MCH 30.8 09/20/2018 1050   MCHC 34.0 09/20/2018 1050   RDW 12.4 09/20/2018 1050   LYMPHSABS 2.6 09/20/2018 1050   EOSABS 0.1 09/20/2018 1050   BASOSABS 0.0 09/20/2018 1050   Iron/TIBC/Ferritin/ %Sat No results found for: IRON, TIBC, FERRITIN, IRONPCTSAT Lipid Panel     Component Value Date/Time   CHOL 174 09/20/2018 1050   TRIG 109 09/20/2018 1050   HDL 49 09/20/2018 1050   CHOLHDL 3.6 09/20/2018 1050   LDLCALC 103 (H) 09/20/2018 1050   Hepatic Function Panel     Component Value Date/Time   PROT 7.2 09/20/2018 1050   ALBUMIN 4.8 09/20/2018 1050   AST 18 09/20/2018 1050   ALT 18 09/20/2018 1050   ALKPHOS  70 09/20/2018 1050   BILITOT 0.6 09/20/2018 1050      Component Value Date/Time   TSH 2.550 11/15/2018 1532   TSH 3.480 09/20/2018 1050    Ref. Range 09/20/2018 10:50  Vitamin D, 25-Hydroxy Latest Ref Range: 30.0 - 100.0 ng/mL 60.2   OBESITY BEHAVIORAL  INTERVENTION VISIT  Today's visit was #6  Starting weight: 219 lbs Starting date: 09/20/2018 Today's weight: 203 lbs Today's date: 12/18/2018 Total lbs lost to date: 16  ASK: We discussed the diagnosis of obesity with Flonnie Overman today and Manuela Schwartz agreed to give [name] permission to discuss obesity behavioral modification therapy today.  ASSESS: Kyanne has the diagnosis of obesity and her BMI today is 35.96. Avannah is in the action stage of change.  ADVISE: Beauty was educated on the multiple health risks of obesity as well as the benefit of weight loss to improve her health. She was advised of the need for long term treatment and the importance of lifestyle modifications to improve her current health and to decrease her risk of future health problems.  AGREE: Multiple dietary modification options and treatment options were discussed and  Camri agreed to follow the recommendations documented in the above note.  ARRANGE: Rees was educated on the importance of frequent visits to treat obesity as outlined per CMS and USPSTF guidelines and agreed to schedule her next follow up appointment today.  Migdalia Dk, am acting as transcriptionist for Abby Potash, PA-C I, Abby Potash, PA-C have reviewed above note and agree with its content

## 2018-12-21 MED FILL — HYDROCHLOROTHIAZIDE 12.5 MG: 12.5 | 90 days supply | Qty: 90 | Fill #0

## 2018-12-21 MED FILL — LOSARTAN POTASSIUM 50 MG TA: 50 | 90 days supply | Qty: 90 | Fill #0

## 2018-12-21 MED FILL — ALPRAZolam 0.5 MG TABS: 0.5 | 30 days supply | Qty: 30 | Fill #0

## 2018-12-21 MED FILL — FAMOTIDINE 20 MG TABLET: 20 | 90 days supply | Qty: 180 | Fill #0

## 2018-12-21 MED FILL — SIMVASTATIN 40 MG TABLET: 40 | 90 days supply | Qty: 90 | Fill #0

## 2018-12-28 ENCOUNTER — Encounter: Payer: Self-pay | Admitting: Obstetrics & Gynecology

## 2018-12-28 ENCOUNTER — Other Ambulatory Visit (HOSPITAL_COMMUNITY)
Admission: RE | Admit: 2018-12-28 | Discharge: 2018-12-28 | Disposition: A | Discharge status: Home / Self Care | Payer: 59 | Source: Ambulatory Visit | Attending: Obstetrics & Gynecology | Admitting: Obstetrics & Gynecology

## 2018-12-28 ENCOUNTER — Ambulatory Visit (INDEPENDENT_AMBULATORY_CARE_PROVIDER_SITE_OTHER): Payer: 59 | Admitting: Obstetrics & Gynecology

## 2018-12-28 ENCOUNTER — Other Ambulatory Visit: Payer: Self-pay

## 2018-12-28 VITALS — BP 114/62 | HR 80 | Resp 16 | Ht 63.25 in | Wt 207.0 lb

## 2018-12-28 DIAGNOSIS — Z124 Encounter for screening for malignant neoplasm of cervix: Secondary | ICD-10-CM | POA: Diagnosis not present

## 2018-12-28 DIAGNOSIS — E669 Obesity, unspecified: Secondary | ICD-10-CM | POA: Insufficient documentation

## 2018-12-28 DIAGNOSIS — K760 Fatty (change of) liver, not elsewhere classified: Secondary | ICD-10-CM | POA: Diagnosis not present

## 2018-12-28 DIAGNOSIS — Z01419 Encounter for gynecological examination (general) (routine) without abnormal findings: Secondary | ICD-10-CM | POA: Diagnosis not present

## 2018-12-28 DIAGNOSIS — I10 Essential (primary) hypertension: Secondary | ICD-10-CM | POA: Diagnosis not present

## 2018-12-28 NOTE — Progress Notes (Signed)
63 y.o. G65P2002 Divorced White or Caucasian female here for new patient annual exam.  Hasn't had any vaginal bleeding since her mid- to late 14's.  Never on HRT.  Having a lot of vaginal dryness.    Has started weight loss clinic in November.  Has lost 15# thus far.  Goal is to lose 5 pounds.  HbA1C went down from 6.3 to 5.7.   PCP:  Dr. Drema Dallas  Patient's last menstrual period was 11/01/2003 (approximate).          Sexually active: No.  The current method of family planning is post menopausal status.    Exercising: No.   Smoker:  former  Health Maintenance: Pap:  11/23/15 Neg    11/06/12 Neg   History of abnormal Pap:  no MMG:  12/29/17 Diagnostic Bilat BIRADS1:Neg. Has appt 01/01/19 Colonoscopy:  12/28/17 f/u 10 years.   BMD:   2019 Osteopenia - with [facility]  TDaP:  11/06/12 Pneumonia vaccine(s):  Done  Shingrix:   Completed  Hep C testing: done with PCP  Screening Labs: PCP   reports that she quit smoking about 20 years ago. Her smoking use included cigarettes. She quit after 25.00 years of use. She has never used smokeless tobacco. She reports current alcohol use of about 2.0 standard drinks of alcohol per week. She reports that she does not use drugs.  Past Medical History:  Diagnosis Date  . Allergy   . Anxiety   . Aortic atherosclerosis ([place])   . Arthritis   . Asthma   . Depression   . Eczema   . Fatty liver   . Fatty liver   . GERD (gastroesophageal reflux disease)   . Glaucoma   . Graves disease   . Heart murmur   . Hyperlipidemia   . Hypertension   . Hypertensive retinopathy   . Insomnia   . Kidney problem   . Obesity   . Osteoarthritis   . Osteopenia   . Prediabetes   . Rosacea   . Thyroid disease   . Vitamin D deficiency   . Vocal cord nodules     Past Surgical History:  Procedure Laterality Date  . CESAREAN SECTION     x 1  . MICROLARYNGOSCOPY Left 11/06/2017   Procedure: MICRO DIRECT LARYNGOSCOPY REMOVAL OF VOCAL CORD;  Surgeon: Jodi Marble, MD;   Location: [facility];  Service: ENT;  Laterality: Left;  . THYROIDECTOMY, PARTIAL  1975   due to Grave's disease    Current Outpatient Medications  Medication Sig Dispense Refill  . acetaminophen (TYLENOL) 500 MG tablet Take 500 mg by mouth every 6 (six) hours as needed.    Marland Kitchen albuterol (VENTOLIN HFA) 108 (90 Base) MCG/ACT inhaler Inhale 1 puff into the lungs every 6 (six) hours as needed for wheezing or shortness of breath.    . ALPRAZolam (XANAX) 0.5 MG tablet Take 0.5 mg by mouth at bedtime as needed for anxiety.    . famotidine (PEPCID) 20 MG tablet Take 20 mg by mouth 2 (two) times daily.    . hydrochlorothiazide (MICROZIDE) 12.5 MG capsule Take 12.5 mg by mouth daily.    Marland Kitchen levothyroxine (SYNTHROID, LEVOTHROID) 100 MCG tablet Take 1 tablet (100 mcg total) by mouth daily. 30 tablet 0  . levothyroxine (SYNTHROID, LEVOTHROID) 88 MCG tablet Take 1 tablet (88 mcg total) by mouth daily before breakfast. 30 tablet 0  . losartan (COZAAR) 50 MG tablet Take 50 mg by mouth daily.    Marland Kitchen  metFORMIN (GLUCOPHAGE) 500 MG tablet Take 1 tablet (500 mg total) by mouth 2 (two) times daily with a meal. 60 tablet 0  . methocarbamol (ROBAXIN) 500 MG tablet methocarbamol 500 mg tablet  1 tablet po at bedtime as needed for muscle spasms    . metroNIDAZOLE (METROCREAM) 0.75 % cream Apply 1 application topically 2 (two) times daily.    . Multiple Vitamins-Minerals (MULTIVITAMIN WITH MINERALS) tablet Take 1 tablet by mouth daily.    . Omega-3 Fatty Acids (FISH OIL) 1000 MG CPDR Take by mouth.    . pantoprazole (PROTONIX) 20 MG tablet Take 20 mg by mouth 2 (two) times daily before a meal.    . simvastatin (ZOCOR) 40 MG tablet Take 40 mg by mouth daily.    . traMADol (ULTRAM) 50 MG tablet Take by mouth every 6 (six) hours as needed.    . TURMERIC PO Take 450 mg by mouth daily.    Marland Kitchen VITAMIN D, ERGOCALCIFEROL, PO Take 5,000 mg daily by mouth.     No current facility-administered medications for this  visit.     Family History  Problem Relation Age of Onset  . Hypertension Mother   . Hyperlipidemia Mother   . Diabetes Mother   . Thyroid disease Mother   . Hypertension Father   . Heart disease Father        CHF  . Colon cancer Father 63       stage 4  . Kidney disease Father   . Sleep apnea Father   . Obesity Father   . Thyroid disease Daughter   . Esophageal cancer Neg Hx   . Rectal cancer Neg Hx   . Liver cancer Neg Hx   . Pancreatic cancer Neg Hx   . Stomach cancer Neg Hx     Review of Systems  All other systems reviewed and are negative.   Exam:   BP 114/62 (BP Location: Right Arm, Patient Position: Sitting, Cuff Size: Large)   Pulse 80   Resp 16   Ht 5' 3.25" (1.607 m)   Wt 207 lb (93.9 kg)   LMP 11/01/2003 (Approximate)   BMI 36.38 kg/m    Height: 5' 3.25" (160.7 cm)  Ht Readings from Last 3 Encounters:  12/28/18 5' 3.25" (1.607 m)  12/18/18 5\' 3"  (1.6 m)  12/03/18 5\' 3"  (1.6 m)    General appearance: alert, cooperative and appears stated age Head: Normocephalic, without obvious abnormality, atraumatic Neck: no adenopathy, supple, symmetrical, trachea midline and thyroid normal to inspection and palpation Lungs: clear to auscultation bilaterally Breasts: normal appearance, no masses or tenderness Heart: regular rate and rhythm Abdomen: soft, non-tender; bowel sounds normal; no masses,  no organomegaly, small umbilical hernia noted, rectus muscle diastasis noted as well Extremities: extremities normal, atraumatic, no cyanosis or edema Skin: Skin color, texture, turgor normal. No rashes or lesions Lymph nodes: Cervical, supraclavicular, and axillary nodes normal. No abnormal inguinal nodes palpated Neurologic: Grossly normal  Pelvic: External genitalia:  no lesions              Urethra:  normal appearing urethra with no masses, tenderness or lesions              Bartholins and Skenes: normal                 Vagina: normal appearing vagina with normal  color and discharge, no lesions              Cervix: no lesions  Pap taken: Yes.   Bimanual Exam:  Uterus:  normal size, contour, position, consistency, mobility, non-tender              Adnexa: normal adnexa               Rectovaginal: Confirms               Anus:  normal sphincter tone, no lesions  Chaperone was present for exam.  A:  Well Woman with normal exam PMP, no HRT H/O prediabetes Fatty liver disease Hypertension Elevated lipids GERD BMI 37 H/O partial thyroidectomy now with hypothryoidism Vit D deficiency  P:   Mammogram guidelines reviewed.  Doing yearly MMG. pap smear with HR HPV obtained today Release of records for last BMD, vaccines records and lab work from Dr. Drema Dallas signed today Vaccines UTD Colonoscopy done 2019 return annually or prn

## 2018-12-31 ENCOUNTER — Encounter (INDEPENDENT_AMBULATORY_CARE_PROVIDER_SITE_OTHER): Payer: Self-pay | Admitting: Physician Assistant

## 2018-12-31 ENCOUNTER — Ambulatory Visit (INDEPENDENT_AMBULATORY_CARE_PROVIDER_SITE_OTHER): Payer: 59 | Admitting: Physician Assistant

## 2018-12-31 VITALS — BP 111/74 | HR 68 | Temp 97.8°F | Ht 63.0 in | Wt 202.0 lb

## 2018-12-31 DIAGNOSIS — Z6835 Body mass index (BMI) 35.0-35.9, adult: Secondary | ICD-10-CM | POA: Diagnosis not present

## 2018-12-31 DIAGNOSIS — I10 Essential (primary) hypertension: Secondary | ICD-10-CM

## 2018-12-31 LAB — CYTOLOGY - PAP
Diagnosis: NEGATIVE
HPV: NOT DETECTED

## 2018-12-31 NOTE — Progress Notes (Signed)
Office: [phone]  /  Fax: [phone]   HPI:   Chief Complaint: OBESITY Sana is here to discuss her progress with her obesity treatment plan. She is on the Category 2 plan and is following her eating plan approximately 50% of the time. She states she is exercising 0 minutes 0 times per week. Jaleigh did well with weight loss. She reports being bored with dinner at times. She denies hunger. Her weight is 202 lb (91.6 kg) today and has had a weight loss of 1 pound over a period of 2 weeks since her last visit. She has lost 17 lbs since starting treatment with [name].  Hypertension JULES VIDOVICH is a 63 y.o. female with hypertension and is currently taking losartan.  Flonnie Overman denies chest pain. She is working weight loss to help control her blood pressure with the goal of decreasing her risk of heart attack and stroke. Jamaria's blood pressure is currently controlled.  ASSESSMENT AND PLAN:  Essential hypertension  Class 2 severe obesity with serious comorbidity and body mass index (BMI) of 35.0 to 35.9 in adult, unspecified obesity type ([place])  PLAN:  Hypertension We discussed sodium restriction, working on healthy weight loss, and a regular exercise program as the means to achieve improved blood pressure control. Saraann agreed with this plan and agreed to follow-up as directed. We will continue to monitor her blood pressure as well as her progress with the above lifestyle modifications. She will continue taking losartan as prescribed and will watch for signs of hypotension as she continues her lifestyle modifications.  Obesity Jurni is currently in the action stage of change. As such, her goal is to continue with weight loss efforts. She has agreed to follow the Category 2 plan. Donise has been instructed to work up to a goal of 150 minutes of combined cardio and strengthening exercise per week for weight loss and overall health benefits. We discussed the following Behavioral  Modification Strategies today: work on meal planning and easy cooking plans and ways to avoid boredom eating.  Mandee has agreed to follow-up with our clinic in 2 weeks. She was informed of the importance of frequent follow up visits to maximize her success with intensive lifestyle modifications for her multiple health conditions.  ALLERGIES: Allergies  Allergen Reactions  . Sulfa Antibiotics Rash    MEDICATIONS: Current Outpatient Medications on File Prior to Visit  Medication Sig Dispense Refill  . acetaminophen (TYLENOL) 500 MG tablet Take 500 mg by mouth every 6 (six) hours as needed.    Marland Kitchen albuterol (VENTOLIN HFA) 108 (90 Base) MCG/ACT inhaler Inhale 1 puff into the lungs every 6 (six) hours as needed for wheezing or shortness of breath.    . ALPRAZolam (XANAX) 0.5 MG tablet Take 0.5 mg by mouth at bedtime as needed for anxiety.    . famotidine (PEPCID) 20 MG tablet Take 20 mg by mouth 2 (two) times daily.    . hydrochlorothiazide (MICROZIDE) 12.5 MG capsule Take 12.5 mg by mouth daily.    Marland Kitchen levothyroxine (SYNTHROID, LEVOTHROID) 100 MCG tablet Take 1 tablet (100 mcg total) by mouth daily. 30 tablet 0  . levothyroxine (SYNTHROID, LEVOTHROID) 88 MCG tablet Take 1 tablet (88 mcg total) by mouth daily before breakfast. 30 tablet 0  . losartan (COZAAR) 50 MG tablet Take 50 mg by mouth daily.    . metFORMIN (GLUCOPHAGE) 500 MG tablet Take 1 tablet (500 mg total) by mouth 2 (two) times daily with a meal. 60 tablet  0  . methocarbamol (ROBAXIN) 500 MG tablet methocarbamol 500 mg tablet  1 tablet po at bedtime as needed for muscle spasms    . metroNIDAZOLE (METROCREAM) 0.75 % cream Apply 1 application topically 2 (two) times daily.    . Multiple Vitamins-Minerals (MULTIVITAMIN WITH MINERALS) tablet Take 1 tablet by mouth daily.    . Omega-3 Fatty Acids (FISH OIL) 1000 MG CPDR Take by mouth.    . pantoprazole (PROTONIX) 20 MG tablet Take 20 mg by mouth 2 (two) times daily before a meal.    .  simvastatin (ZOCOR) 40 MG tablet Take 40 mg by mouth daily.    . traMADol (ULTRAM) 50 MG tablet Take by mouth every 6 (six) hours as needed.    . TURMERIC PO Take 450 mg by mouth daily.    Marland Kitchen VITAMIN D, ERGOCALCIFEROL, PO Take 5,000 mg daily by mouth.     No current facility-administered medications on file prior to visit.     PAST MEDICAL HISTORY: Past Medical History:  Diagnosis Date  . Allergy   . Anxiety   . Aortic atherosclerosis ([place])   . Arthritis   . Asthma   . Depression   . Eczema   . Fatty liver   . GERD (gastroesophageal reflux disease)   . Glaucoma   . Graves disease   . Heart murmur   . Hyperlipidemia   . Hypertension   . Hypertensive retinopathy   . Insomnia   . Kidney problem   . Obesity   . Osteoarthritis   . Osteopenia   . Prediabetes   . Rosacea   . Thyroid disease   . Vitamin D deficiency   . Vocal cord nodules     PAST SURGICAL HISTORY: Past Surgical History:  Procedure Laterality Date  . CESAREAN SECTION     x 1  . MICROLARYNGOSCOPY Left 11/06/2017   Procedure: MICRO DIRECT LARYNGOSCOPY REMOVAL OF VOCAL CORD;  Surgeon: Jodi Marble, MD;  Location: [facility];  Service: ENT;  Laterality: Left;  . THYROIDECTOMY, PARTIAL  1975   due to Grave's disease    SOCIAL HISTORY: Social History   Tobacco Use  . Smoking status: Former Smoker    Years: 25.00    Types: Cigarettes    Last attempt to quit: 10/31/1998    Years since quitting: 20.1  . Smokeless tobacco: Never Used  Substance Use Topics  . Alcohol use: Yes    Alcohol/week: 2.0 standard drinks    Types: 2 Glasses of wine per week  . Drug use: No    FAMILY HISTORY: Family History  Problem Relation Age of Onset  . Hypertension Mother   . Hyperlipidemia Mother   . Diabetes Mother   . Thyroid disease Mother   . Hypertension Father   . Heart disease Father        CHF  . Colon cancer Father 60       stage 4  . Kidney disease Father   . Sleep apnea Father   .  Obesity Father   . Thyroid disease Daughter   . Esophageal cancer Neg Hx   . Rectal cancer Neg Hx   . Liver cancer Neg Hx   . Pancreatic cancer Neg Hx   . Stomach cancer Neg Hx    ROS: Review of Systems  Constitutional: Positive for weight loss.  Cardiovascular: Negative for chest pain.  Endo/Heme/Allergies:       Negative for hypoglycemia.   PHYSICAL EXAM: Blood pressure 111/74,  pulse 68, temperature 97.8 F (36.6 C), height 5\' 3"  (1.6 m), weight 202 lb (91.6 kg), last menstrual period 11/01/2003, SpO2 98 %. Body mass index is 35.78 kg/m. Physical Exam Vitals signs reviewed.  Constitutional:      Appearance: Normal appearance. She is obese.  Cardiovascular:     Rate and Rhythm: Normal rate.     Pulses: Normal pulses.  Pulmonary:     Effort: Pulmonary effort is normal.     Breath sounds: Normal breath sounds.  Musculoskeletal: Normal range of motion.  Skin:    General: Skin is warm and dry.  Neurological:     Mental Status: She is alert and oriented to person, place, and time.  Psychiatric:        Behavior: Behavior normal.   RECENT LABS AND TESTS: BMET    Component Value Date/Time   NA 137 09/20/2018 1050   K 4.2 09/20/2018 1050   CL 96 09/20/2018 1050   CO2 25 09/20/2018 1050   GLUCOSE 98 09/20/2018 1050   GLUCOSE 117 (H) 11/02/2017 1100   BUN 19 09/20/2018 1050   CREATININE 0.87 09/20/2018 1050   CALCIUM 9.9 09/20/2018 1050   GFRNONAA 73 09/20/2018 1050   GFRAA 84 09/20/2018 1050   Lab Results  Component Value Date   HGBA1C 6.3 (H) 09/20/2018   Lab Results  Component Value Date   INSULIN 17.2 09/20/2018   CBC    Component Value Date/Time   WBC 10.8 09/20/2018 1050   RBC 4.28 09/20/2018 1050   HGB 13.2 09/20/2018 1050   HCT 38.8 09/20/2018 1050   PLT 243 09/20/2018 1050   MCV 91 09/20/2018 1050   MCH 30.8 09/20/2018 1050   MCHC 34.0 09/20/2018 1050   RDW 12.4 09/20/2018 1050   LYMPHSABS 2.6 09/20/2018 1050   EOSABS 0.1 09/20/2018 1050    BASOSABS 0.0 09/20/2018 1050   Iron/TIBC/Ferritin/ %Sat No results found for: IRON, TIBC, FERRITIN, IRONPCTSAT Lipid Panel     Component Value Date/Time   CHOL 174 09/20/2018 1050   TRIG 109 09/20/2018 1050   HDL 49 09/20/2018 1050   CHOLHDL 3.6 09/20/2018 1050   LDLCALC 103 (H) 09/20/2018 1050   Hepatic Function Panel     Component Value Date/Time   PROT 7.2 09/20/2018 1050   ALBUMIN 4.8 09/20/2018 1050   AST 18 09/20/2018 1050   ALT 18 09/20/2018 1050   ALKPHOS 70 09/20/2018 1050   BILITOT 0.6 09/20/2018 1050      Component Value Date/Time   TSH 2.550 11/15/2018 1532   TSH 3.480 09/20/2018 1050    Ref. Range 09/20/2018 10:50  Vitamin D, 25-Hydroxy Latest Ref Range: 30.0 - 100.0 ng/mL 60.2   OBESITY BEHAVIORAL INTERVENTION VISIT  Today's visit was #7   Starting weight: 219 lbs Starting date: 09/20/2018 Today's weight: 202 lbs Today's date: 12/31/2018 Total lbs lost to date: 17    12/31/2018  Height 5\' 3"  (1.6 m)  Weight 202 lb (91.6 kg)  BMI (Calculated) 35.79  BLOOD PRESSURE - SYSTOLIC 818  BLOOD PRESSURE - DIASTOLIC 74   Body Fat % 56.3 %  Total Body Water (lbs) 77 lbs   ASK: We discussed the diagnosis of obesity with Flonnie Overman today and Kady agreed to give [name] permission to discuss obesity behavioral modification therapy today.  ASSESS: Khalis has the diagnosis of obesity and her BMI today is 35.79. Amillia is in the action stage of change.   ADVISE: Nautica was educated on the multiple health  risks of obesity as well as the benefit of weight loss to improve her health. She was advised of the need for long term treatment and the importance of lifestyle modifications to improve her current health and to decrease her risk of future health problems.  AGREE: Multiple dietary modification options and treatment options were discussed and  Darline agreed to follow the recommendations documented in the above note.  ARRANGE: Cyndie was educated on the importance of  frequent visits to treat obesity as outlined per CMS and USPSTF guidelines and agreed to schedule her next follow up appointment today.  Migdalia Dk, am acting as transcriptionist for Abby Potash, PA-C I, Abby Potash, PA-C have reviewed above note and agree with its content

## 2019-01-01 ENCOUNTER — Ambulatory Visit
Admission: RE | Admit: 2019-01-01 | Discharge: 2019-01-01 | Disposition: A | Discharge status: Home / Self Care | Payer: 59 | Source: Ambulatory Visit | Attending: Family Medicine | Admitting: Family Medicine

## 2019-01-01 DIAGNOSIS — Z1231 Encounter for screening mammogram for malignant neoplasm of breast: Secondary | ICD-10-CM | POA: Diagnosis not present

## 2019-01-02 DIAGNOSIS — M1712 Unilateral primary osteoarthritis, left knee: Secondary | ICD-10-CM | POA: Diagnosis not present

## 2019-01-02 DIAGNOSIS — M1711 Unilateral primary osteoarthritis, right knee: Secondary | ICD-10-CM | POA: Diagnosis not present

## 2019-01-23 ENCOUNTER — Encounter (INDEPENDENT_AMBULATORY_CARE_PROVIDER_SITE_OTHER): Payer: Self-pay

## 2019-01-24 ENCOUNTER — Other Ambulatory Visit: Payer: Self-pay

## 2019-01-24 ENCOUNTER — Encounter (INDEPENDENT_AMBULATORY_CARE_PROVIDER_SITE_OTHER): Payer: Self-pay | Admitting: Physician Assistant

## 2019-01-24 ENCOUNTER — Ambulatory Visit (INDEPENDENT_AMBULATORY_CARE_PROVIDER_SITE_OTHER): Payer: 59 | Admitting: Physician Assistant

## 2019-01-24 DIAGNOSIS — R7303 Prediabetes: Secondary | ICD-10-CM | POA: Diagnosis not present

## 2019-01-24 DIAGNOSIS — Z6835 Body mass index (BMI) 35.0-35.9, adult: Secondary | ICD-10-CM | POA: Diagnosis not present

## 2019-01-28 NOTE — Progress Notes (Addendum)
Office: [phone]  /  Fax: [phone] TeleHealth Visit:  Morgan Williams has consented to this TeleHealth visit today via FaceTime. The patient is located at home, the provider is located at the [employer] [facility] office. The participants in this visit include the listed provider and patient.   HPI:   Chief Complaint: OBESITY Morgan Williams is here to discuss her progress with her obesity treatment plan. She is on the Category 2 plan and is following her eating plan approximately 60% of the time. She states she is walking 1 mile 3 times per week. [name] reports that she has lost 2 lbs according to her scale at home. She states she has been stress eating in the evenings. We were unable to weight the patient today for this TeleHealth visit.She feels as if she has lost weight since her last visit. She has lost 17 lbs since starting treatment with [name].  Pre-Diabetes Morgan Williams has a diagnosis of prediabetes based on her elevated Hgb A1c and was informed this puts her at greater risk of developing diabetes. Her last A1c was reported to be 5.7 on 12/14/2018. She is taking metformin currently and continues to work on diet and exercise to decrease risk of diabetes. She denies nausea, vomiting, diarrhea, or polyphagia.  ASSESSMENT AND PLAN:  Prediabetes  Class 2 severe obesity with serious comorbidity and body mass index (BMI) of 35.0 to 35.9 in adult, unspecified obesity type [facility])  PLAN:  Pre-Diabetes Morgan Williams will continue to work on weight loss, exercise, and decreasing simple carbohydrates in her diet to help decrease the risk of diabetes. We dicussed metformin including benefits and risks. She was informed that eating too many simple carbohydrates or too many calories at one sitting increases the likelihood of GI side effects. [name] is currently taking metformin and a prescription was not written today. Robbi agreed to follow-up with [name] as directed to monitor her progress.  I spent > than 50%  of the 25 minute visit on counseling as documented in the note.  Obesity Morgan Williams is currently in the action stage of change. As such, her goal is to continue with weight loss efforts. She has agreed to follow the Category 2 plan. Morgan Williams has been instructed to work up to a goal of 150 minutes of combined cardio and strengthening exercise per week for weight loss and overall health benefits. We discussed the following Behavioral Modification Strategies today: work on meal planning, easy cooking plans, and keeping healthy foods in the home.  Morgan Williams has agreed to follow-up with our clinic in 2 weeks. She was informed of the importance of frequent follow-up visits to maximize her success with intensive lifestyle modifications for her multiple health conditions.  I spent > than 50% of the 25 minute visit on counseling as documented in the note.   ALLERGIES: Allergies  Allergen Reactions  . Sulfa Antibiotics Rash    MEDICATIONS: Current Outpatient Medications on File Prior to Visit  Medication Sig Dispense Refill  . acetaminophen (TYLENOL) 500 MG tablet Take 500 mg by mouth every 6 (six) hours as needed.    Marland Kitchen albuterol (VENTOLIN HFA) 108 (90 Base) MCG/ACT inhaler Inhale 1 puff into the lungs every 6 (six) hours as needed for wheezing or shortness of breath.    . ALPRAZolam (XANAX) 0.5 MG tablet Take 0.5 mg by mouth at bedtime as needed for anxiety.    . famotidine (PEPCID) 20 MG tablet Take 20 mg by mouth 2 (two) times daily.    Marland Kitchen  hydrochlorothiazide (MICROZIDE) 12.5 MG capsule Take 12.5 mg by mouth daily.    Marland Kitchen levothyroxine (SYNTHROID, LEVOTHROID) 100 MCG tablet Take 1 tablet (100 mcg total) by mouth daily. 30 tablet 0  . levothyroxine (SYNTHROID, LEVOTHROID) 88 MCG tablet Take 1 tablet (88 mcg total) by mouth daily before breakfast. 30 tablet 0  . losartan (COZAAR) 50 MG tablet Take 50 mg by mouth daily.    . metFORMIN (GLUCOPHAGE) 500 MG tablet Take 1 tablet (500 mg total) by mouth 2 (two)  times daily with a meal. 60 tablet 0  . methocarbamol (ROBAXIN) 500 MG tablet methocarbamol 500 mg tablet  1 tablet po at bedtime as needed for muscle spasms    . metroNIDAZOLE (METROCREAM) 0.75 % cream Apply 1 application topically 2 (two) times daily.    . Multiple Vitamins-Minerals (MULTIVITAMIN WITH MINERALS) tablet Take 1 tablet by mouth daily.    . Omega-3 Fatty Acids (FISH OIL) 1000 MG CPDR Take by mouth.    . pantoprazole (PROTONIX) 20 MG tablet Take 20 mg by mouth 2 (two) times daily before a meal.    . simvastatin (ZOCOR) 40 MG tablet Take 40 mg by mouth daily.    . traMADol (ULTRAM) 50 MG tablet Take by mouth every 6 (six) hours as needed.    . TURMERIC PO Take 450 mg by mouth daily.    Marland Kitchen VITAMIN D, ERGOCALCIFEROL, PO Take 5,000 mg daily by mouth.     No current facility-administered medications on file prior to visit.     PAST MEDICAL HISTORY: Past Medical History:  Diagnosis Date  . Allergy   . Anxiety   . Aortic atherosclerosis ([place])   . Arthritis   . Asthma   . Depression   . Eczema   . Fatty liver   . GERD (gastroesophageal reflux disease)   . Glaucoma   . Graves disease   . Heart murmur   . Hyperlipidemia   . Hypertension   . Hypertensive retinopathy   . Insomnia   . Kidney problem   . Obesity   . Osteoarthritis   . Osteopenia   . Prediabetes   . Rosacea   . Thyroid disease   . Vitamin D deficiency   . Vocal cord nodules     PAST SURGICAL HISTORY: Past Surgical History:  Procedure Laterality Date  . CESAREAN SECTION     x 1  . MICROLARYNGOSCOPY Left 11/06/2017   Procedure: MICRO DIRECT LARYNGOSCOPY REMOVAL OF VOCAL CORD;  Surgeon: Jodi Marble, MD;  Location: [place];  Service: ENT;  Laterality: Left;  . THYROIDECTOMY, PARTIAL  1975   due to Grave's disease    SOCIAL HISTORY: Social History   Tobacco Use  . Smoking status: Former Smoker    Years: 25.00    Types: Cigarettes    Last attempt to quit: 10/31/1998    Years  since quitting: 20.2  . Smokeless tobacco: Never Used  Substance Use Topics  . Alcohol use: Yes    Alcohol/week: 2.0 standard drinks    Types: 2 Glasses of wine per week  . Drug use: No    FAMILY HISTORY: Family History  Problem Relation Age of Onset  . Hypertension Mother   . Hyperlipidemia Mother   . Diabetes Mother   . Thyroid disease Mother   . Hypertension Father   . Heart disease Father        CHF  . Colon cancer Father 17       stage 4  .  Kidney disease Father   . Sleep apnea Father   . Obesity Father   . Thyroid disease Daughter   . Esophageal cancer Neg Hx   . Rectal cancer Neg Hx   . Liver cancer Neg Hx   . Pancreatic cancer Neg Hx   . Stomach cancer Neg Hx    ROS: Review of Systems  Gastrointestinal: Negative for diarrhea, nausea and vomiting.  Endo/Heme/Allergies:       Negative for polyphagia.   PHYSICAL EXAM: Pt in no acute distress  RECENT LABS AND TESTS: BMET    Component Value Date/Time   NA 137 09/20/2018 1050   K 4.2 09/20/2018 1050   CL 96 09/20/2018 1050   CO2 25 09/20/2018 1050   GLUCOSE 98 09/20/2018 1050   GLUCOSE 117 (H) 11/02/2017 1100   BUN 19 09/20/2018 1050   CREATININE 0.87 09/20/2018 1050   CALCIUM 9.9 09/20/2018 1050   GFRNONAA 73 09/20/2018 1050   GFRAA 84 09/20/2018 1050   Lab Results  Component Value Date   HGBA1C 6.3 (H) 09/20/2018   Lab Results  Component Value Date   INSULIN 17.2 09/20/2018   CBC    Component Value Date/Time   WBC 10.8 09/20/2018 1050   RBC 4.28 09/20/2018 1050   HGB 13.2 09/20/2018 1050   HCT 38.8 09/20/2018 1050   PLT 243 09/20/2018 1050   MCV 91 09/20/2018 1050   MCH 30.8 09/20/2018 1050   MCHC 34.0 09/20/2018 1050   RDW 12.4 09/20/2018 1050   LYMPHSABS 2.6 09/20/2018 1050   EOSABS 0.1 09/20/2018 1050   BASOSABS 0.0 09/20/2018 1050   Iron/TIBC/Ferritin/ %Sat No results found for: IRON, TIBC, FERRITIN, IRONPCTSAT Lipid Panel     Component Value Date/Time   CHOL 174 09/20/2018  1050   TRIG 109 09/20/2018 1050   HDL 49 09/20/2018 1050   CHOLHDL 3.6 09/20/2018 1050   LDLCALC 103 (H) 09/20/2018 1050   Hepatic Function Panel     Component Value Date/Time   PROT 7.2 09/20/2018 1050   ALBUMIN 4.8 09/20/2018 1050   AST 18 09/20/2018 1050   ALT 18 09/20/2018 1050   ALKPHOS 70 09/20/2018 1050   BILITOT 0.6 09/20/2018 1050      Component Value Date/Time   TSH 2.550 11/15/2018 1532   TSH 3.480 09/20/2018 1050   Results for INICE, SANLUIS (MRN [id number]) as of 01/28/2019 11:56  Ref. Range 09/20/2018 10:50  Vitamin D, 25-Hydroxy Latest Ref Range: 30.0 - 100.0 ng/mL 60.2    I, Michaelene Song, am acting as Location manager for [employer], PA-C I, Abby Potash, PA-C have reviewed above note and agree with its content

## 2019-02-07 ENCOUNTER — Encounter (INDEPENDENT_AMBULATORY_CARE_PROVIDER_SITE_OTHER): Payer: Self-pay | Admitting: Physician Assistant

## 2019-02-07 ENCOUNTER — Ambulatory Visit (INDEPENDENT_AMBULATORY_CARE_PROVIDER_SITE_OTHER): Payer: 59 | Admitting: Physician Assistant

## 2019-02-07 ENCOUNTER — Other Ambulatory Visit: Payer: Self-pay

## 2019-02-07 DIAGNOSIS — R7303 Prediabetes: Secondary | ICD-10-CM

## 2019-02-07 DIAGNOSIS — E559 Vitamin D deficiency, unspecified: Secondary | ICD-10-CM | POA: Diagnosis not present

## 2019-02-07 DIAGNOSIS — Z6835 Body mass index (BMI) 35.0-35.9, adult: Secondary | ICD-10-CM | POA: Diagnosis not present

## 2019-02-07 MED ORDER — METFORMIN HCL 500 MG PO TABS: 500.0000 mg | ORAL_TABLET | Freq: Two times a day (BID) | ORAL | 0 refills | Status: DC

## 2019-02-07 MED FILL — metFORMIN HCL 500 MG TABS: 500 | 30 days supply | Qty: 60 | Fill #0

## 2019-02-07 NOTE — Progress Notes (Signed)
Office: [phone]  /  Fax: [phone] TeleHealth Visit:  Morgan Williams has verbally consented to this TeleHealth visit today. The patient is located at home, the provider is located at the [employer] [facility] office. The participants in this visit include the listed provider and patient. The visit was conducted today via Webex.  HPI:   Chief Complaint: OBESITY Morgan Williams is here to discuss her progress with her obesity treatment plan. She is on the Category 2 plan and is following her eating plan approximately 60% of the time. She states she is walking 1.5 miles 3-4 times per week. [name] reports that she is eating more protein on the plan and getting her meals in. She is no longer stress eating and is having less cravings. We were unable to weigh the patient today for this TeleHealth visit. She feels as if she has lost 2 lbs since her last visit. She has lost 17 lbs since starting treatment with [name].  Pre-Diabetes Morgan Williams has a diagnosis of prediabetes based on her elevated Hgb A1c and was informed this puts her at greater risk of developing diabetes. She is taking metformin currently and continues to work on diet and exercise to decrease risk of diabetes. She denies nausea, vomiting, diarrhea, or hypoglycemia. No polyphagia.  Vitamin D deficiency Morgan Williams has a diagnosis of Vitamin D deficiency. She is currently taking OTC Vit D and denies nausea, vomiting or muscle weakness.  ASSESSMENT AND PLAN:  Prediabetes - Plan: metFORMIN (GLUCOPHAGE) 500 MG tablet  Vitamin D deficiency  Class 2 severe obesity with serious comorbidity and body mass index (BMI) of 35.0 to 35.9 in adult, unspecified obesity type [name])  PLAN:  Pre-Diabetes Morgan Williams will continue to work on weight loss, exercise, and decreasing simple carbohydrates in her diet to help decrease the risk of diabetes. We dicussed metformin including benefits and risks. She was informed that eating too many simple carbohydrates or  too many calories at one sitting increases the likelihood of GI side effects. [name] is currently taking metformin and a refill prescription was written today for #60 with 0 refills. [name] agrees to follow-up with our clinic in 3 weeks.  Vitamin D Deficiency Morgan Williams was informed that low Vitamin D levels contributes to fatigue and are associated with obesity, breast, and colon cancer. She agrees to continue taking OTC Vit D and will follow-up for routine testing of Vitamin D, at least 2-3 times per year. She was informed of the risk of over-replacement of Vitamin D and agrees to not increase her dose unless she discusses this with [name] first. Morgan Williams agrees to follow-up with our clinic in 3 weeks.  Obesity Morgan Williams is currently in the action stage of change. As such, her goal is to continue with weight loss efforts. She has agreed to follow the Category 2 plan. Morgan Williams has been instructed to work up to a goal of 150 minutes of combined cardio and strengthening exercise per week for weight loss and overall health benefits. We discussed the following Behavioral Modification Strategies today: work on meal planning, easy cooking plans, and keeping healthy foods in the home.  Morgan Williams has agreed to follow-up with our clinic in 3 weeks. She was informed of the importance of frequent follow-up visits to maximize her success with intensive lifestyle modifications for her multiple health conditions.  ALLERGIES: Allergies  Allergen Reactions  . Sulfa Antibiotics Rash    MEDICATIONS: Current Outpatient Medications on File Prior to Visit  Medication Sig Dispense Refill  . acetaminophen (  TYLENOL) 500 MG tablet Take 500 mg by mouth every 6 (six) hours as needed.    Marland Kitchen albuterol (VENTOLIN HFA) 108 (90 Base) MCG/ACT inhaler Inhale 1 puff into the lungs every 6 (six) hours as needed for wheezing or shortness of breath.    . ALPRAZolam (XANAX) 0.5 MG tablet Take 0.5 mg by mouth at bedtime as needed for anxiety.    .  famotidine (PEPCID) 20 MG tablet Take 20 mg by mouth 2 (two) times daily.    . hydrochlorothiazide (MICROZIDE) 12.5 MG capsule Take 12.5 mg by mouth daily.    Marland Kitchen levothyroxine (SYNTHROID, LEVOTHROID) 100 MCG tablet Take 1 tablet (100 mcg total) by mouth daily. 30 tablet 0  . levothyroxine (SYNTHROID, LEVOTHROID) 88 MCG tablet Take 1 tablet (88 mcg total) by mouth daily before breakfast. 30 tablet 0  . losartan (COZAAR) 50 MG tablet Take 50 mg by mouth daily.    . metFORMIN (GLUCOPHAGE) 500 MG tablet Take 1 tablet (500 mg total) by mouth 2 (two) times daily with a meal. 60 tablet 0  . methocarbamol (ROBAXIN) 500 MG tablet methocarbamol 500 mg tablet  1 tablet po at bedtime as needed for muscle spasms    . metroNIDAZOLE (METROCREAM) 0.75 % cream Apply 1 application topically 2 (two) times daily.    . Multiple Vitamins-Minerals (MULTIVITAMIN WITH MINERALS) tablet Take 1 tablet by mouth daily.    . Omega-3 Fatty Acids (FISH OIL) 1000 MG CPDR Take by mouth.    . pantoprazole (PROTONIX) 20 MG tablet Take 20 mg by mouth 2 (two) times daily before a meal.    . simvastatin (ZOCOR) 40 MG tablet Take 40 mg by mouth daily.    . traMADol (ULTRAM) 50 MG tablet Take by mouth every 6 (six) hours as needed.    . TURMERIC PO Take 450 mg by mouth daily.    Marland Kitchen VITAMIN D, ERGOCALCIFEROL, PO Take 5,000 mg daily by mouth.     No current facility-administered medications on file prior to visit.     PAST MEDICAL HISTORY: Past Medical History:  Diagnosis Date  . Allergy   . Anxiety   . Aortic atherosclerosis ([place])   . Arthritis   . Asthma   . Depression   . Eczema   . Fatty liver   . GERD (gastroesophageal reflux disease)   . Glaucoma   . Graves disease   . Heart murmur   . Hyperlipidemia   . Hypertension   . Hypertensive retinopathy   . Insomnia   . Kidney problem   . Obesity   . Osteoarthritis   . Osteopenia   . Prediabetes   . Rosacea   . Thyroid disease   . Vitamin D deficiency   . Vocal cord  nodules     PAST SURGICAL HISTORY: Past Surgical History:  Procedure Laterality Date  . CESAREAN SECTION     x 1  . MICROLARYNGOSCOPY Left 11/06/2017   Procedure: MICRO DIRECT LARYNGOSCOPY REMOVAL OF VOCAL CORD;  Surgeon: Jodi Marble, MD;  Location: [place];  Service: ENT;  Laterality: Left;  . THYROIDECTOMY, PARTIAL  1975   due to Grave's disease    SOCIAL HISTORY: Social History   Tobacco Use  . Smoking status: Former Smoker    Years: 25.00    Types: Cigarettes    Last attempt to quit: 10/31/1998    Years since quitting: 20.2  . Smokeless tobacco: Never Used  Substance Use Topics  . Alcohol use: Yes  Alcohol/week: 2.0 standard drinks    Types: 2 Glasses of wine per week  . Drug use: No    FAMILY HISTORY: Family History  Problem Relation Age of Onset  . Hypertension Mother   . Hyperlipidemia Mother   . Diabetes Mother   . Thyroid disease Mother   . Hypertension Father   . Heart disease Father        CHF  . Colon cancer Father 72       stage 4  . Kidney disease Father   . Sleep apnea Father   . Obesity Father   . Thyroid disease Daughter   . Esophageal cancer Neg Hx   . Rectal cancer Neg Hx   . Liver cancer Neg Hx   . Pancreatic cancer Neg Hx   . Stomach cancer Neg Hx    ROS: Review of Systems  Gastrointestinal: Negative for diarrhea, nausea and vomiting.  Musculoskeletal:       Negative for muscle weakness.  Endo/Heme/Allergies:       Negative for polyphagia. Negative for hypoglycemia.   PHYSICAL EXAM: Pt in no acute distress  RECENT LABS AND TESTS: BMET    Component Value Date/Time   NA 137 09/20/2018 1050   K 4.2 09/20/2018 1050   CL 96 09/20/2018 1050   CO2 25 09/20/2018 1050   GLUCOSE 98 09/20/2018 1050   GLUCOSE 117 (H) 11/02/2017 1100   BUN 19 09/20/2018 1050   CREATININE 0.87 09/20/2018 1050   CALCIUM 9.9 09/20/2018 1050   GFRNONAA 73 09/20/2018 1050   GFRAA 84 09/20/2018 1050   Lab Results  Component Value  Date   HGBA1C 6.3 (H) 09/20/2018   Lab Results  Component Value Date   INSULIN 17.2 09/20/2018   CBC    Component Value Date/Time   WBC 10.8 09/20/2018 1050   RBC 4.28 09/20/2018 1050   HGB 13.2 09/20/2018 1050   HCT 38.8 09/20/2018 1050   PLT 243 09/20/2018 1050   MCV 91 09/20/2018 1050   MCH 30.8 09/20/2018 1050   MCHC 34.0 09/20/2018 1050   RDW 12.4 09/20/2018 1050   LYMPHSABS 2.6 09/20/2018 1050   EOSABS 0.1 09/20/2018 1050   BASOSABS 0.0 09/20/2018 1050   Iron/TIBC/Ferritin/ %Sat No results found for: IRON, TIBC, FERRITIN, IRONPCTSAT Lipid Panel     Component Value Date/Time   CHOL 174 09/20/2018 1050   TRIG 109 09/20/2018 1050   HDL 49 09/20/2018 1050   CHOLHDL 3.6 09/20/2018 1050   LDLCALC 103 (H) 09/20/2018 1050   Hepatic Function Panel     Component Value Date/Time   PROT 7.2 09/20/2018 1050   ALBUMIN 4.8 09/20/2018 1050   AST 18 09/20/2018 1050   ALT 18 09/20/2018 1050   ALKPHOS 70 09/20/2018 1050   BILITOT 0.6 09/20/2018 1050      Component Value Date/Time   TSH 2.550 11/15/2018 1532   TSH 3.480 09/20/2018 1050   Results for PAELYN, SMICK (MRN [id number]) as of 02/07/2019 11:20  Ref. Range 09/20/2018 10:50  Vitamin D, 25-Hydroxy Latest Ref Range: 30.0 - 100.0 ng/mL 60.2    I, Michaelene Song, am acting as Location manager for [employer], PA-C I, Abby Potash, PA-C have reviewed above note and agree with its content

## 2019-02-28 ENCOUNTER — Encounter (INDEPENDENT_AMBULATORY_CARE_PROVIDER_SITE_OTHER): Payer: Self-pay | Admitting: Physician Assistant

## 2019-02-28 ENCOUNTER — Other Ambulatory Visit: Payer: Self-pay

## 2019-02-28 ENCOUNTER — Ambulatory Visit (INDEPENDENT_AMBULATORY_CARE_PROVIDER_SITE_OTHER): Payer: 59 | Admitting: Physician Assistant

## 2019-02-28 DIAGNOSIS — Z6835 Body mass index (BMI) 35.0-35.9, adult: Secondary | ICD-10-CM | POA: Diagnosis not present

## 2019-02-28 DIAGNOSIS — E038 Other specified hypothyroidism: Secondary | ICD-10-CM

## 2019-02-28 DIAGNOSIS — R7303 Prediabetes: Secondary | ICD-10-CM | POA: Diagnosis not present

## 2019-02-28 MED ORDER — LEVOTHYROXINE SODIUM 88 MCG PO TABS: 88.0000 ug | ORAL_TABLET | Freq: Every day | ORAL | 0 refills | Status: DC

## 2019-02-28 MED ORDER — LEVOTHYROXINE SODIUM 100 MCG PO TABS: 100.0000 ug | ORAL_TABLET | Freq: Every day | ORAL | 0 refills | Status: DC

## 2019-02-28 MED FILL — LEVOTHYROXINE 88 MCG TABLET: 88 | 30 days supply | Qty: 30 | Fill #0

## 2019-02-28 MED FILL — LEVOTHYROXINE 100 MCG TAB: 100 | 30 days supply | Qty: 30 | Fill #0

## 2019-02-28 NOTE — Progress Notes (Signed)
Office: [phone]  /  Fax: [phone] TeleHealth Visit:  Nisa Decaire has verbally consented to this TeleHealth visit today. The patient is located at home, the provider is located at the [employer] [facility] office. The participants in this visit include the listed provider and patient. The visit was conducted today via Webex.  HPI:   Chief Complaint: OBESITY Edwyna is here to discuss her progress with her obesity treatment plan. She is on the Category 2 plan and is following her eating plan approximately 50% of the time. She states she is exercising 0 minutes 0 times per week. Alethia reports her weight today is 200.4 lbs. She has not been journaling. She reports breakfast and lunch to be good, but is having cravings after dinner and is not getting enough protein at dinner.  We were unable to weigh the patient today for this TeleHealth visit. She feels as if she has gained 2 lbs since her last visit. She has lost 17 lbs since starting treatment with [name].  Hypothyroidism Chiquitta has a diagnosis of hypothyroidism. She is on levothyroxine. She denies heat or cold intolerance.  Pre-Diabetes Keilly has a diagnosis of prediabetes based on her elevated Hgb A1c and was informed this puts her at greater risk of developing diabetes. She is taking metformin but is only taking it in the morning. She continues to work on diet and exercise to decrease risk of diabetes. She denies nausea, vomiting, or diarrhea. She does report polyphagia in the evening.  ASSESSMENT AND PLAN:  Other specified hypothyroidism - Plan: levothyroxine (SYNTHROID) 88 MCG tablet, levothyroxine (SYNTHROID) 100 MCG tablet  Prediabetes  Class 2 severe obesity with serious comorbidity and body mass index (BMI) of 35.0 to 35.9 in adult, unspecified obesity type ([place])  PLAN:  Hypothyroidism Shylynn was informed of the importance of good thyroid control to help with weight loss efforts. She was also informed that  supertheraputic thyroid levels are dangerous and will not improve weight loss results. Montie was given a refill on her Synthroid 88 mcg #30 with 0 refills and Synthroid 100 mcg #30 with 0 refills. She agrees to follow-up with our clinic in 3 weeks.  Pre-Diabetes Bess will continue to work on weight loss, exercise, and decreasing simple carbohydrates in her diet to help decrease the risk of diabetes. We dicussed metformin including benefits and risks. She was informed that eating too many simple carbohydrates or too many calories at one sitting increases the likelihood of GI side effects. Kaniya was advised to take metformin at dinner and to follow-up with [name] as directed to monitor her progress.  Obesity Mela is currently in the action stage of change. As such, her goal is to continue with weight loss efforts. She has agreed to keep a food journal with 1200 calories and 85 grams of protein daily. Arynn has been instructed to work up to a goal of 150 minutes of combined cardio and strengthening exercise per week for weight loss and overall health benefits. We discussed the following Behavioral Modification Strategies today: work on meal planning, easy cooking plans, and keeping healthy foods in the home.  Nakshatra has agreed to follow-up with our clinic in 3 weeks. She was informed of the importance of frequent follow-up visits to maximize her success with intensive lifestyle modifications for her multiple health conditions.  ALLERGIES: Allergies  Allergen Reactions  . Sulfa Antibiotics Rash    MEDICATIONS: Current Outpatient Medications on File Prior to Visit  Medication Sig Dispense Refill  .  acetaminophen (TYLENOL) 500 MG tablet Take 500 mg by mouth every 6 (six) hours as needed.    Marland Kitchen albuterol (VENTOLIN HFA) 108 (90 Base) MCG/ACT inhaler Inhale 1 puff into the lungs every 6 (six) hours as needed for wheezing or shortness of breath.    . ALPRAZolam (XANAX) 0.5 MG tablet Take 0.5 mg by mouth  at bedtime as needed for anxiety.    . famotidine (PEPCID) 20 MG tablet Take 20 mg by mouth 2 (two) times daily.    . hydrochlorothiazide (MICROZIDE) 12.5 MG capsule Take 12.5 mg by mouth daily.    Marland Kitchen losartan (COZAAR) 50 MG tablet Take 50 mg by mouth daily.    . metFORMIN (GLUCOPHAGE) 500 MG tablet Take 1 tablet (500 mg total) by mouth 2 (two) times daily with a meal. 60 tablet 0  . methocarbamol (ROBAXIN) 500 MG tablet methocarbamol 500 mg tablet  1 tablet po at bedtime as needed for muscle spasms    . metroNIDAZOLE (METROCREAM) 0.75 % cream Apply 1 application topically 2 (two) times daily.    . Multiple Vitamins-Minerals (MULTIVITAMIN WITH MINERALS) tablet Take 1 tablet by mouth daily.    . Omega-3 Fatty Acids (FISH OIL) 1000 MG CPDR Take by mouth.    . pantoprazole (PROTONIX) 20 MG tablet Take 20 mg by mouth 2 (two) times daily before a meal.    . simvastatin (ZOCOR) 40 MG tablet Take 40 mg by mouth daily.    . traMADol (ULTRAM) 50 MG tablet Take by mouth every 6 (six) hours as needed.    . TURMERIC PO Take 450 mg by mouth daily.    Marland Kitchen VITAMIN D, ERGOCALCIFEROL, PO Take 5,000 mg daily by mouth.     No current facility-administered medications on file prior to visit.     PAST MEDICAL HISTORY: Past Medical History:  Diagnosis Date  . Allergy   . Anxiety   . Aortic atherosclerosis ([place])   . Arthritis   . Asthma   . Depression   . Eczema   . Fatty liver   . GERD (gastroesophageal reflux disease)   . Glaucoma   . Graves disease   . Heart murmur   . Hyperlipidemia   . Hypertension   . Hypertensive retinopathy   . Insomnia   . Kidney problem   . Obesity   . Osteoarthritis   . Osteopenia   . Prediabetes   . Rosacea   . Thyroid disease   . Vitamin D deficiency   . Vocal cord nodules     PAST SURGICAL HISTORY: Past Surgical History:  Procedure Laterality Date  . CESAREAN SECTION     x 1  . MICROLARYNGOSCOPY Left 11/06/2017   Procedure: MICRO DIRECT LARYNGOSCOPY REMOVAL  OF VOCAL CORD;  Surgeon: Jodi Marble, MD;  Location: [place];  Service: ENT;  Laterality: Left;  . THYROIDECTOMY, PARTIAL  1975   due to Grave's disease    SOCIAL HISTORY: Social History   Tobacco Use  . Smoking status: Former Smoker    Years: 25.00    Types: Cigarettes    Last attempt to quit: 10/31/1998    Years since quitting: 20.3  . Smokeless tobacco: Never Used  Substance Use Topics  . Alcohol use: Yes    Alcohol/week: 2.0 standard drinks    Types: 2 Glasses of wine per week  . Drug use: No    FAMILY HISTORY: Family History  Problem Relation Age of Onset  . Hypertension Mother   .  Hyperlipidemia Mother   . Diabetes Mother   . Thyroid disease Mother   . Hypertension Father   . Heart disease Father        CHF  . Colon cancer Father 67       stage 4  . Kidney disease Father   . Sleep apnea Father   . Obesity Father   . Thyroid disease Daughter   . Esophageal cancer Neg Hx   . Rectal cancer Neg Hx   . Liver cancer Neg Hx   . Pancreatic cancer Neg Hx   . Stomach cancer Neg Hx    ROS: Review of Systems  Gastrointestinal: Negative for diarrhea, nausea and vomiting.  Endo/Heme/Allergies:       Negative for heat/cold intolerance. Positive for polyphagia in the evening.   PHYSICAL EXAM: Pt in no acute distress  RECENT LABS AND TESTS: BMET    Component Value Date/Time   NA 137 09/20/2018 1050   K 4.2 09/20/2018 1050   CL 96 09/20/2018 1050   CO2 25 09/20/2018 1050   GLUCOSE 98 09/20/2018 1050   GLUCOSE 117 (H) 11/02/2017 1100   BUN 19 09/20/2018 1050   CREATININE 0.87 09/20/2018 1050   CALCIUM 9.9 09/20/2018 1050   GFRNONAA 73 09/20/2018 1050   GFRAA 84 09/20/2018 1050   Lab Results  Component Value Date   HGBA1C 6.3 (H) 09/20/2018   Lab Results  Component Value Date   INSULIN 17.2 09/20/2018   CBC    Component Value Date/Time   WBC 10.8 09/20/2018 1050   RBC 4.28 09/20/2018 1050   HGB 13.2 09/20/2018 1050   HCT 38.8  09/20/2018 1050   PLT 243 09/20/2018 1050   MCV 91 09/20/2018 1050   MCH 30.8 09/20/2018 1050   MCHC 34.0 09/20/2018 1050   RDW 12.4 09/20/2018 1050   LYMPHSABS 2.6 09/20/2018 1050   EOSABS 0.1 09/20/2018 1050   BASOSABS 0.0 09/20/2018 1050   Iron/TIBC/Ferritin/ %Sat No results found for: IRON, TIBC, FERRITIN, IRONPCTSAT Lipid Panel     Component Value Date/Time   CHOL 174 09/20/2018 1050   TRIG 109 09/20/2018 1050   HDL 49 09/20/2018 1050   CHOLHDL 3.6 09/20/2018 1050   LDLCALC 103 (H) 09/20/2018 1050   Hepatic Function Panel     Component Value Date/Time   PROT 7.2 09/20/2018 1050   ALBUMIN 4.8 09/20/2018 1050   AST 18 09/20/2018 1050   ALT 18 09/20/2018 1050   ALKPHOS 70 09/20/2018 1050   BILITOT 0.6 09/20/2018 1050      Component Value Date/Time   TSH 2.550 11/15/2018 1532   TSH 3.480 09/20/2018 1050   Results for MARIADELCARMEN, CORELLA (MRN [id number]) as of 02/28/2019 13:08  Ref. Range 09/20/2018 10:50  Vitamin D, 25-Hydroxy Latest Ref Range: 30.0 - 100.0 ng/mL 60.2   I, Michaelene Song, am acting as Location manager for [employer], PA-C I, Abby Potash, PA-C have reviewed above note and agree with its content

## 2019-03-21 ENCOUNTER — Encounter (INDEPENDENT_AMBULATORY_CARE_PROVIDER_SITE_OTHER): Payer: Self-pay | Admitting: Physician Assistant

## 2019-03-21 ENCOUNTER — Ambulatory Visit (INDEPENDENT_AMBULATORY_CARE_PROVIDER_SITE_OTHER): Payer: 59 | Admitting: Physician Assistant

## 2019-03-21 ENCOUNTER — Other Ambulatory Visit: Payer: Self-pay

## 2019-03-21 DIAGNOSIS — R7303 Prediabetes: Secondary | ICD-10-CM

## 2019-03-21 DIAGNOSIS — Z6835 Body mass index (BMI) 35.0-35.9, adult: Secondary | ICD-10-CM | POA: Diagnosis not present

## 2019-03-21 DIAGNOSIS — E7849 Other hyperlipidemia: Secondary | ICD-10-CM

## 2019-03-21 MED ORDER — METFORMIN HCL 500 MG PO TABS: 500.0000 mg | ORAL_TABLET | Freq: Two times a day (BID) | ORAL | 0 refills | Status: DC

## 2019-03-21 NOTE — Progress Notes (Signed)
Office: [phone]  /  Fax: [phone] TeleHealth Visit:  Morgan Williams has verbally consented to this TeleHealth visit today. The patient is located at home, the provider is located at the [employer] [facility] office. The participants in this visit include the listed provider and patient. The visit was conducted today via Webex.  HPI:   Chief Complaint: OBESITY [name] is here to discuss her progress with her obesity treatment plan. She is on the Category 2 plan and is following her eating plan approximately 60% of the time. She states she is exercising 0 minutes 0 times per week. Morgan Williams reports her most recent weight to be 197 lbs. She is taking metformin in the evening, which is helping with her cravings.  We were unable to weigh the patient today for this TeleHealth visit. She feels as if she has lost 2 lbs (weight 197 lbs) since her last visit. She has lost 17 lbs since starting treatment with [name].  Pre-Diabetes Morgan Williams has a diagnosis of prediabetes based on her elevated Hgb A1c and was informed this puts her at greater risk of developing diabetes. She is taking metformin currently and continues to work on diet and exercise to decrease risk of diabetes. She denies nausea, vomiting, or diarrhea. No polyphagia.  Hyperlipidemia [name] has hyperlipidemia and has been trying to improve her cholesterol levels with intensive lifestyle modification including a low saturated fat diet, exercise and weight loss. She is on Zocor and denies any chest pain.  ASSESSMENT AND PLAN:  Prediabetes - Plan: metFORMIN (GLUCOPHAGE) 500 MG tablet  Other hyperlipidemia  Class 2 severe obesity with serious comorbidity and body mass index (BMI) of 35.0 to 35.9 in adult, unspecified obesity type [facility])  PLAN:  Pre-Diabetes Morgan Williams will continue to work on weight loss, exercise, and decreasing simple carbohydrates in her diet to help decrease the risk of diabetes. We dicussed metformin including  benefits and risks. She was informed that eating too many simple carbohydrates or too many calories at one sitting increases the likelihood of GI side effects. Morgan Williams was given a refill on her metformin #60 with 0 refills and agrees to follow-up with our clinic in 3 weeks.  Hyperlipidemia Morgan Williams was informed of the American Heart Association Guidelines emphasizing intensive lifestyle modifications as the first line treatment for hyperlipidemia. We discussed many lifestyle modifications today in depth, and Morgan Williams will continue to work on decreasing saturated fats such as fatty red meat, butter and many fried foods. She will continue Zocor, increase vegetables and lean protein in her diet, and continue to work on exercise and weight loss efforts.  Obesity [name] is currently in the action stage of change. As such, her goal is to continue with weight loss efforts. She has agreed to follow the Category 2 plan. [name] has been instructed to work up to a goal of 150 minutes of combined cardio and strengthening exercise per week for weight loss and overall health benefits. We discussed the following Behavioral Modification Strategies today: work on meal planning and easy cooking plans, and keeping healthy foods in the home.  Morgan Williams has agreed to follow-up with our clinic in 3 weeks. She was informed of the importance of frequent follow-up visits to maximize her success with intensive lifestyle modifications for her multiple health conditions.  ALLERGIES: Allergies  Allergen Reactions  . Sulfa Antibiotics Rash    MEDICATIONS: Current Outpatient Medications on File Prior to Visit  Medication Sig Dispense Refill  . acetaminophen (TYLENOL) 500 MG tablet Take  500 mg by mouth every 6 (six) hours as needed.    Marland Kitchen albuterol (VENTOLIN HFA) 108 (90 Base) MCG/ACT inhaler Inhale 1 puff into the lungs every 6 (six) hours as needed for wheezing or shortness of breath.    . ALPRAZolam (XANAX) 0.5 MG tablet Take 0.5 mg  by mouth at bedtime as needed for anxiety.    . famotidine (PEPCID) 20 MG tablet Take 20 mg by mouth 2 (two) times daily.    . hydrochlorothiazide (MICROZIDE) 12.5 MG capsule Take 12.5 mg by mouth daily.    Marland Kitchen levothyroxine (SYNTHROID) 100 MCG tablet Take 1 tablet (100 mcg total) by mouth daily. 30 tablet 0  . levothyroxine (SYNTHROID) 88 MCG tablet Take 1 tablet (88 mcg total) by mouth daily before breakfast. 30 tablet 0  . losartan (COZAAR) 50 MG tablet Take 50 mg by mouth daily.    . metFORMIN (GLUCOPHAGE) 500 MG tablet Take 1 tablet (500 mg total) by mouth 2 (two) times daily with a meal. 60 tablet 0  . methocarbamol (ROBAXIN) 500 MG tablet methocarbamol 500 mg tablet  1 tablet po at bedtime as needed for muscle spasms    . metroNIDAZOLE (METROCREAM) 0.75 % cream Apply 1 application topically 2 (two) times daily.    . Multiple Vitamins-Minerals (MULTIVITAMIN WITH MINERALS) tablet Take 1 tablet by mouth daily.    . Omega-3 Fatty Acids (FISH OIL) 1000 MG CPDR Take by mouth.    . pantoprazole (PROTONIX) 20 MG tablet Take 20 mg by mouth 2 (two) times daily before a meal.    . simvastatin (ZOCOR) 40 MG tablet Take 40 mg by mouth daily.    . traMADol (ULTRAM) 50 MG tablet Take by mouth every 6 (six) hours as needed.    . TURMERIC PO Take 450 mg by mouth daily.    Marland Kitchen VITAMIN D, ERGOCALCIFEROL, PO Take 5,000 mg daily by mouth.     No current facility-administered medications on file prior to visit.     PAST MEDICAL HISTORY: Past Medical History:  Diagnosis Date  . Allergy   . Anxiety   . Aortic atherosclerosis ([place])   . Arthritis   . Asthma   . Depression   . Eczema   . Fatty liver   . GERD (gastroesophageal reflux disease)   . Glaucoma   . Graves disease   . Heart murmur   . Hyperlipidemia   . Hypertension   . Hypertensive retinopathy   . Insomnia   . Kidney problem   . Obesity   . Osteoarthritis   . Osteopenia   . Prediabetes   . Rosacea   . Thyroid disease   . Vitamin D  deficiency   . Vocal cord nodules     PAST SURGICAL HISTORY: Past Surgical History:  Procedure Laterality Date  . CESAREAN SECTION     x 1  . MICROLARYNGOSCOPY Left 11/06/2017   Procedure: MICRO DIRECT LARYNGOSCOPY REMOVAL OF VOCAL CORD;  Surgeon: Jodi Marble, MD;  Location: [place];  Service: ENT;  Laterality: Left;  . THYROIDECTOMY, PARTIAL  1975   due to Grave's disease    SOCIAL HISTORY: Social History   Tobacco Use  . Smoking status: Former Smoker    Years: 25.00    Types: Cigarettes    Last attempt to quit: 10/31/1998    Years since quitting: 20.4  . Smokeless tobacco: Never Used  Substance Use Topics  . Alcohol use: Yes    Alcohol/week: 2.0 standard drinks  Types: 2 Glasses of wine per week  . Drug use: No    FAMILY HISTORY: Family History  Problem Relation Age of Onset  . Hypertension Mother   . Hyperlipidemia Mother   . Diabetes Mother   . Thyroid disease Mother   . Hypertension Father   . Heart disease Father        CHF  . Colon cancer Father 64       stage 4  . Kidney disease Father   . Sleep apnea Father   . Obesity Father   . Thyroid disease Daughter   . Esophageal cancer Neg Hx   . Rectal cancer Neg Hx   . Liver cancer Neg Hx   . Pancreatic cancer Neg Hx   . Stomach cancer Neg Hx    ROS: Review of Systems  Cardiovascular: Negative for chest pain.  Gastrointestinal: Negative for diarrhea, nausea and vomiting.  Endo/Heme/Allergies:       Negative for polyphagia.   PHYSICAL EXAM: Pt in no acute distress  RECENT LABS AND TESTS: BMET    Component Value Date/Time   NA 137 09/20/2018 1050   K 4.2 09/20/2018 1050   CL 96 09/20/2018 1050   CO2 25 09/20/2018 1050   GLUCOSE 98 09/20/2018 1050   GLUCOSE 117 (H) 11/02/2017 1100   BUN 19 09/20/2018 1050   CREATININE 0.87 09/20/2018 1050   CALCIUM 9.9 09/20/2018 1050   GFRNONAA 73 09/20/2018 1050   GFRAA 84 09/20/2018 1050   Lab Results  Component Value Date   HGBA1C  6.3 (H) 09/20/2018   Lab Results  Component Value Date   INSULIN 17.2 09/20/2018   CBC    Component Value Date/Time   WBC 10.8 09/20/2018 1050   RBC 4.28 09/20/2018 1050   HGB 13.2 09/20/2018 1050   HCT 38.8 09/20/2018 1050   PLT 243 09/20/2018 1050   MCV 91 09/20/2018 1050   MCH 30.8 09/20/2018 1050   MCHC 34.0 09/20/2018 1050   RDW 12.4 09/20/2018 1050   LYMPHSABS 2.6 09/20/2018 1050   EOSABS 0.1 09/20/2018 1050   BASOSABS 0.0 09/20/2018 1050   Iron/TIBC/Ferritin/ %Sat No results found for: IRON, TIBC, FERRITIN, IRONPCTSAT Lipid Panel     Component Value Date/Time   CHOL 174 09/20/2018 1050   TRIG 109 09/20/2018 1050   HDL 49 09/20/2018 1050   CHOLHDL 3.6 09/20/2018 1050   LDLCALC 103 (H) 09/20/2018 1050   Hepatic Function Panel     Component Value Date/Time   PROT 7.2 09/20/2018 1050   ALBUMIN 4.8 09/20/2018 1050   AST 18 09/20/2018 1050   ALT 18 09/20/2018 1050   ALKPHOS 70 09/20/2018 1050   BILITOT 0.6 09/20/2018 1050      Component Value Date/Time   TSH 2.550 11/15/2018 1532   TSH 3.480 09/20/2018 1050   Results for KIMYATA, MILICH (MRN [id number]) as of 03/21/2019 12:24  Ref. Range 09/20/2018 10:50  Vitamin D, 25-Hydroxy Latest Ref Range: 30.0 - 100.0 ng/mL 60.2    I, Michaelene Song, am acting as Location manager for [employer], PA-C I, Abby Potash, PA-C have reviewed above note and agree with its content

## 2019-03-26 MED FILL — metFORMIN HCL 500 MG TABS: 500 | 30 days supply | Qty: 60 | Fill #0

## 2019-03-26 MED FILL — LOSARTAN POTASSIUM 50 MG TA: 50 | 30 days supply | Qty: 30 | Fill #1

## 2019-03-26 MED FILL — HYDROCHLOROTHIAZIDE 12.5 MG: 12.5 | 90 days supply | Qty: 90 | Fill #1

## 2019-03-26 MED FILL — SIMVASTATIN 40 MG TABLET: 40 | 90 days supply | Qty: 90 | Fill #1

## 2019-04-09 ENCOUNTER — Other Ambulatory Visit: Payer: Self-pay

## 2019-04-09 ENCOUNTER — Ambulatory Visit (INDEPENDENT_AMBULATORY_CARE_PROVIDER_SITE_OTHER): Payer: 59 | Admitting: Physician Assistant

## 2019-04-09 ENCOUNTER — Encounter (INDEPENDENT_AMBULATORY_CARE_PROVIDER_SITE_OTHER): Payer: Self-pay | Admitting: Physician Assistant

## 2019-04-09 DIAGNOSIS — E7849 Other hyperlipidemia: Secondary | ICD-10-CM | POA: Diagnosis not present

## 2019-04-09 DIAGNOSIS — R7303 Prediabetes: Secondary | ICD-10-CM | POA: Diagnosis not present

## 2019-04-09 DIAGNOSIS — Z6835 Body mass index (BMI) 35.0-35.9, adult: Secondary | ICD-10-CM

## 2019-04-09 MED ORDER — METFORMIN HCL 500 MG PO TABS: 500.0000 mg | ORAL_TABLET | Freq: Two times a day (BID) | ORAL | 0 refills | Status: DC

## 2019-04-09 NOTE — Progress Notes (Signed)
Office: [phone]  /  Fax: [phone] TeleHealth Visit:  Morgan Williams has verbally consented to this TeleHealth visit today. The patient is located at home, the provider is located at the [employer] [facility] office. The participants in this visit include the listed provider and patient. The visit was conducted today via webex.  HPI:   Chief Complaint: OBESITY Morgan Williams is here to discuss her progress with her obesity treatment plan. She is on the Category 2 plan and is following her eating plan approximately 60 % of the time. She states she is exercising 0 minutes 0 times per week. [name] reports that her mother was staying with her the last week, and her meal planning decreased. They have been eating more fast food. Her weight is 198 lbs today. We were unable to weigh the patient today for this TeleHealth visit. She feels as if she has maintained her weight since her last visit. She has lost 17 lbs since starting treatment with Morgan Williams.  Pre-Diabetes [name] has a diagnosis of pre-diabetes based on her elevated Hgb A1c and was informed this puts her at greater risk of developing diabetes. She is taking metformin currently and denies nausea, vomiting, or diarrhea. She continues to work on diet and exercise to decrease risk of diabetes. She denies polyphagia or hypoglycemia.  Hyperlipidemia [name] has hyperlipidemia and has been trying to improve her cholesterol levels with intensive lifestyle modification including a low saturated fat diet, exercise and weight loss. She is on simvastatin and denies any chest pain, claudication or myalgias.  ASSESSMENT AND PLAN:  Prediabetes - Plan: metFORMIN (GLUCOPHAGE) 500 MG tablet  Other hyperlipidemia  Class 2 severe obesity with serious comorbidity and body mass index (BMI) of 35.0 to 35.9 in adult, unspecified obesity type [facility])  PLAN:  Pre-Diabetes Morgan Williams will continue to work on weight loss, exercise, and decreasing simple carbohydrates  in her diet to help decrease the risk of diabetes. We dicussed metformin including benefits and risks. She was informed that eating too many simple carbohydrates or too many calories at one sitting increases the likelihood of GI side effects. Jrue agrees to continue taking metformin 500 mg BID #60 and we will refill for 1 month. Kyllie agrees to follow up with our clinic in 3 weeks as directed to monitor her progress.  Hyperlipidemia Morgan Williams was informed of the American Heart Association Guidelines emphasizing intensive lifestyle modifications as the first line treatment for hyperlipidemia. We discussed many lifestyle modifications today in depth, and [name] will continue to work on decreasing saturated fats such as fatty red meat, butter and many fried foods. She will also increase vegetables and lean protein in her diet and continue to work on exercise and weight loss efforts. [name] agrees to continue her medications, and she agrees to follow up with our clinic in 3 weeks.  Obesity [name] is currently in the action stage of change. As such, her goal is to continue with weight loss efforts She has agreed to follow the Category 2 plan [name] has been instructed to work up to a goal of 150 minutes of combined cardio and strengthening exercise per week for weight loss and overall health benefits. We discussed the following Behavioral Modification Strategies today: increasing lean protein intake and work on meal planning and easy cooking plans   [name] has agreed to follow up with our clinic in 3 weeks. She was informed of the importance of frequent follow up visits to maximize her success with intensive lifestyle modifications for  her multiple health conditions.  ALLERGIES: Allergies  Allergen Reactions  . Sulfa Antibiotics Rash    MEDICATIONS: Current Outpatient Medications on File Prior to Visit  Medication Sig Dispense Refill  . acetaminophen (TYLENOL) 500 MG tablet Take 500 mg by mouth every 6  (six) hours as needed.    Marland Kitchen albuterol (VENTOLIN HFA) 108 (90 Base) MCG/ACT inhaler Inhale 1 puff into the lungs every 6 (six) hours as needed for wheezing or shortness of breath.    . ALPRAZolam (XANAX) 0.5 MG tablet Take 0.5 mg by mouth at bedtime as needed for anxiety.    . famotidine (PEPCID) 20 MG tablet Take 20 mg by mouth 2 (two) times daily.    . hydrochlorothiazide (MICROZIDE) 12.5 MG capsule Take 12.5 mg by mouth daily.    Marland Kitchen levothyroxine (SYNTHROID) 100 MCG tablet Take 1 tablet (100 mcg total) by mouth daily. 30 tablet 0  . levothyroxine (SYNTHROID) 88 MCG tablet Take 1 tablet (88 mcg total) by mouth daily before breakfast. 30 tablet 0  . losartan (COZAAR) 50 MG tablet Take 50 mg by mouth daily.    . methocarbamol (ROBAXIN) 500 MG tablet methocarbamol 500 mg tablet  1 tablet po at bedtime as needed for muscle spasms    . metroNIDAZOLE (METROCREAM) 0.75 % cream Apply 1 application topically 2 (two) times daily.    . Multiple Vitamins-Minerals (MULTIVITAMIN WITH MINERALS) tablet Take 1 tablet by mouth daily.    . Omega-3 Fatty Acids (FISH OIL) 1000 MG CPDR Take by mouth.    . pantoprazole (PROTONIX) 20 MG tablet Take 20 mg by mouth 2 (two) times daily before a meal.    . simvastatin (ZOCOR) 40 MG tablet Take 40 mg by mouth daily.    . traMADol (ULTRAM) 50 MG tablet Take by mouth every 6 (six) hours as needed.    . TURMERIC PO Take 450 mg by mouth daily.    Marland Kitchen VITAMIN D, ERGOCALCIFEROL, PO Take 5,000 mg daily by mouth.     No current facility-administered medications on file prior to visit.     PAST MEDICAL HISTORY: Past Medical History:  Diagnosis Date  . Allergy   . Anxiety   . Aortic atherosclerosis ([place])   . Arthritis   . Asthma   . Depression   . Eczema   . Fatty liver   . GERD (gastroesophageal reflux disease)   . Glaucoma   . Graves disease   . Heart murmur   . Hyperlipidemia   . Hypertension   . Hypertensive retinopathy   . Insomnia   . Kidney problem   .  Obesity   . Osteoarthritis   . Osteopenia   . Prediabetes   . Rosacea   . Thyroid disease   . Vitamin D deficiency   . Vocal cord nodules     PAST SURGICAL HISTORY: Past Surgical History:  Procedure Laterality Date  . CESAREAN SECTION     x 1  . MICROLARYNGOSCOPY Left 11/06/2017   Procedure: MICRO DIRECT LARYNGOSCOPY REMOVAL OF VOCAL CORD;  Surgeon: Jodi Marble, MD;  Location: [place];  Service: ENT;  Laterality: Left;  . THYROIDECTOMY, PARTIAL  1975   due to Grave's disease    SOCIAL HISTORY: Social History   Tobacco Use  . Smoking status: Former Smoker    Years: 25.00    Types: Cigarettes    Last attempt to quit: 10/31/1998    Years since quitting: 20.4  . Smokeless tobacco: Never Used  Substance  Use Topics  . Alcohol use: Yes    Alcohol/week: 2.0 standard drinks    Types: 2 Glasses of wine per week  . Drug use: No    FAMILY HISTORY: Family History  Problem Relation Age of Onset  . Hypertension Mother   . Hyperlipidemia Mother   . Diabetes Mother   . Thyroid disease Mother   . Hypertension Father   . Heart disease Father        CHF  . Colon cancer Father 50       stage 4  . Kidney disease Father   . Sleep apnea Father   . Obesity Father   . Thyroid disease Daughter   . Esophageal cancer Neg Hx   . Rectal cancer Neg Hx   . Liver cancer Neg Hx   . Pancreatic cancer Neg Hx   . Stomach cancer Neg Hx     ROS: Review of Systems  Constitutional: Negative for weight loss.  Cardiovascular: Negative for chest pain and claudication.  Gastrointestinal: Negative for diarrhea, nausea and vomiting.  Musculoskeletal: Negative for myalgias.  Endo/Heme/Allergies:       Negative polyphagia Negative hypoglycemia    PHYSICAL EXAM: Pt in no acute distress  RECENT LABS AND TESTS: BMET    Component Value Date/Time   NA 137 09/20/2018 1050   K 4.2 09/20/2018 1050   CL 96 09/20/2018 1050   CO2 25 09/20/2018 1050   GLUCOSE 98 09/20/2018 1050    GLUCOSE 117 (H) 11/02/2017 1100   BUN 19 09/20/2018 1050   CREATININE 0.87 09/20/2018 1050   CALCIUM 9.9 09/20/2018 1050   GFRNONAA 73 09/20/2018 1050   GFRAA 84 09/20/2018 1050   Lab Results  Component Value Date   HGBA1C 6.3 (H) 09/20/2018   Lab Results  Component Value Date   INSULIN 17.2 09/20/2018   CBC    Component Value Date/Time   WBC 10.8 09/20/2018 1050   RBC 4.28 09/20/2018 1050   HGB 13.2 09/20/2018 1050   HCT 38.8 09/20/2018 1050   PLT 243 09/20/2018 1050   MCV 91 09/20/2018 1050   MCH 30.8 09/20/2018 1050   MCHC 34.0 09/20/2018 1050   RDW 12.4 09/20/2018 1050   LYMPHSABS 2.6 09/20/2018 1050   EOSABS 0.1 09/20/2018 1050   BASOSABS 0.0 09/20/2018 1050   Iron/TIBC/Ferritin/ %Sat No results found for: IRON, TIBC, FERRITIN, IRONPCTSAT Lipid Panel     Component Value Date/Time   CHOL 174 09/20/2018 1050   TRIG 109 09/20/2018 1050   HDL 49 09/20/2018 1050   CHOLHDL 3.6 09/20/2018 1050   LDLCALC 103 (H) 09/20/2018 1050   Hepatic Function Panel     Component Value Date/Time   PROT 7.2 09/20/2018 1050   ALBUMIN 4.8 09/20/2018 1050   AST 18 09/20/2018 1050   ALT 18 09/20/2018 1050   ALKPHOS 70 09/20/2018 1050   BILITOT 0.6 09/20/2018 1050      Component Value Date/Time   TSH 2.550 11/15/2018 1532   TSH 3.480 09/20/2018 1050      I, Trixie Dredge, am acting as Location manager for Abby Potash, PA-C I, Abby Potash, PA-C have reviewed above note and agree with its content

## 2019-04-11 DIAGNOSIS — N172 Acute kidney failure with medullary necrosis: Secondary | ICD-10-CM | POA: Diagnosis not present

## 2019-04-11 DIAGNOSIS — I129 Hypertensive chronic kidney disease with stage 1 through stage 4 chronic kidney disease, or unspecified chronic kidney disease: Secondary | ICD-10-CM | POA: Diagnosis not present

## 2019-04-11 DIAGNOSIS — E785 Hyperlipidemia, unspecified: Secondary | ICD-10-CM | POA: Diagnosis not present

## 2019-04-11 DIAGNOSIS — N281 Cyst of kidney, acquired: Secondary | ICD-10-CM | POA: Diagnosis not present

## 2019-04-11 DIAGNOSIS — N182 Chronic kidney disease, stage 2 (mild): Secondary | ICD-10-CM | POA: Diagnosis not present

## 2019-04-11 DIAGNOSIS — R7303 Prediabetes: Secondary | ICD-10-CM | POA: Diagnosis not present

## 2019-04-11 DIAGNOSIS — K219 Gastro-esophageal reflux disease without esophagitis: Secondary | ICD-10-CM | POA: Diagnosis not present

## 2019-04-17 MED FILL — SM ACID REDUCER 20 MG TAB: 20 | 50 days supply | Qty: 100 | Fill #1

## 2019-04-22 DIAGNOSIS — H40023 Open angle with borderline findings, high risk, bilateral: Secondary | ICD-10-CM | POA: Diagnosis not present

## 2019-04-22 DIAGNOSIS — H353131 Nonexudative age-related macular degeneration, bilateral, early dry stage: Secondary | ICD-10-CM | POA: Diagnosis not present

## 2019-04-22 DIAGNOSIS — E119 Type 2 diabetes mellitus without complications: Secondary | ICD-10-CM | POA: Diagnosis not present

## 2019-04-22 DIAGNOSIS — H2513 Age-related nuclear cataract, bilateral: Secondary | ICD-10-CM | POA: Diagnosis not present

## 2019-04-23 ENCOUNTER — Telehealth (INDEPENDENT_AMBULATORY_CARE_PROVIDER_SITE_OTHER): Payer: 59 | Admitting: Physician Assistant

## 2019-04-25 MED FILL — LOSARTAN POTASSIUM 50 MG TA: 50 | 60 days supply | Qty: 60 | Fill #2

## 2019-05-02 ENCOUNTER — Encounter (INDEPENDENT_AMBULATORY_CARE_PROVIDER_SITE_OTHER): Payer: Self-pay | Admitting: Physician Assistant

## 2019-05-02 ENCOUNTER — Other Ambulatory Visit: Payer: Self-pay

## 2019-05-02 ENCOUNTER — Ambulatory Visit (INDEPENDENT_AMBULATORY_CARE_PROVIDER_SITE_OTHER): Payer: 59 | Admitting: Physician Assistant

## 2019-05-02 VITALS — BP 103/69 | HR 69 | Temp 98.4°F | Ht 63.0 in | Wt 195.0 lb

## 2019-05-02 DIAGNOSIS — E559 Vitamin D deficiency, unspecified: Secondary | ICD-10-CM | POA: Diagnosis not present

## 2019-05-02 DIAGNOSIS — R7303 Prediabetes: Secondary | ICD-10-CM

## 2019-05-02 DIAGNOSIS — E038 Other specified hypothyroidism: Secondary | ICD-10-CM

## 2019-05-02 DIAGNOSIS — E7849 Other hyperlipidemia: Secondary | ICD-10-CM

## 2019-05-02 DIAGNOSIS — Z9189 Other specified personal risk factors, not elsewhere classified: Secondary | ICD-10-CM

## 2019-05-02 DIAGNOSIS — Z6834 Body mass index (BMI) 34.0-34.9, adult: Secondary | ICD-10-CM

## 2019-05-02 DIAGNOSIS — E669 Obesity, unspecified: Secondary | ICD-10-CM | POA: Diagnosis not present

## 2019-05-02 MED ORDER — LEVOTHYROXINE SODIUM 100 MCG PO TABS: 100.0000 ug | ORAL_TABLET | Freq: Every day | ORAL | 0 refills | Status: DC

## 2019-05-02 MED ORDER — LEVOTHYROXINE SODIUM 88 MCG PO TABS: 88.0000 ug | ORAL_TABLET | Freq: Every day | ORAL | 0 refills | Status: DC

## 2019-05-02 MED FILL — LEVOTHYROXINE 100 MCG TABLE: 100 | 30 days supply | Qty: 30 | Fill #0

## 2019-05-02 MED FILL — LEVOTHYROXINE 88 MCG TABLET: 88 | 30 days supply | Qty: 30 | Fill #0

## 2019-05-03 LAB — LIPID PANEL WITH LDL/HDL RATIO
Cholesterol, Total: 146 mg/dL (ref 100–199)
HDL: 40 mg/dL (ref >39)
LDL Calculated: 81 mg/dL (ref 0–99)
LDl/HDL Ratio: 2 ratio (ref 0.0–3.2)
Triglycerides: 124 mg/dL (ref 0–149)
VLDL Cholesterol Cal: 25 mg/dL (ref 5–40)

## 2019-05-03 LAB — COMPREHENSIVE METABOLIC PANEL
ALT: 14 IU/L (ref 0–32)
AST: 14 IU/L (ref 0–40)
Albumin/Globulin Ratio: 2.4 — ABNORMAL HIGH (ref 1.2–2.2)
Albumin: 4.7 g/dL (ref 3.8–4.8)
Alkaline Phosphatase: 58 IU/L (ref 39–117)
BUN/Creatinine Ratio: 29 — ABNORMAL HIGH (ref 12–28)
BUN: 20 mg/dL (ref 8–27)
Bilirubin Total: 0.5 mg/dL (ref 0.0–1.2)
CO2: 25 mmol/L (ref 20–29)
Calcium: 9.4 mg/dL (ref 8.7–10.3)
Chloride: 105 mmol/L (ref 96–106)
Creatinine, Ser: 0.7 mg/dL (ref 0.57–1.00)
GFR calc Af Amer: 108 mL/min/{1.73_m2} (ref >59)
GFR calc non Af Amer: 94 mL/min/{1.73_m2} (ref >59)
Globulin, Total: 2 g/dL (ref 1.5–4.5)
Glucose: 98 mg/dL (ref 65–99)
Potassium: 4.2 mmol/L (ref 3.5–5.2)
Sodium: 142 mmol/L (ref 134–144)
Total Protein: 6.7 g/dL (ref 6.0–8.5)

## 2019-05-03 LAB — INSULIN, RANDOM: INSULIN: 11.3 u[IU]/mL (ref 2.6–24.9)

## 2019-05-03 LAB — HEMOGLOBIN A1C
Est. average glucose Bld gHb Est-mCnc: 111 mg/dL
Hgb A1c MFr Bld: 5.5 % (ref 4.8–5.6)

## 2019-05-03 LAB — TSH: TSH: 1.49 u[IU]/mL (ref 0.450–4.500)

## 2019-05-03 LAB — T3: T3, Total: 80 ng/dL (ref 71–180)

## 2019-05-03 LAB — VITAMIN D 25 HYDROXY (VIT D DEFICIENCY, FRACTURES): Vit D, 25-Hydroxy: 71.7 ng/mL (ref 30.0–100.0)

## 2019-05-03 LAB — T4, FREE: Free T4: 1.23 ng/dL (ref 0.82–1.77)

## 2019-05-07 NOTE — Progress Notes (Signed)
Office: [phone]  /  Fax: [phone]   HPI:   Chief Complaint: OBESITY Dyllan is here to discuss her progress with her obesity treatment plan. She is on the Category 2 plan and is following her eating plan approximately 60% of the time. She states she is exercising 0 minutes 0 times per week. Giulia reports that she is not meal planning and has stopped journaling. She stopped taking metformin twice daily at the recommendation of her renal doctor.   Her weight is 195 lb (88.5 kg) today and has had a weight loss of 7 pounds over a period of 3 months since her last in-office visit. She has lost 24 lbs since starting treatment with [name].  Hypothyroidism Lilly has a diagnosis of hypothyroidism. She is on levothyroxine. She denies heat or cold intolerance.  Pre-Diabetes Isabell has a diagnosis of prediabetes based on her elevated Hgb A1c and was informed this puts her at greater risk of developing diabetes. She is taking metformin currently once daily and continues to work on diet and exercise to decrease risk of diabetes. She denies nausea, vomiting, or diarrhea.   At risk for diabetes Lujain is at higher than averagerisk for developing diabetes due to her obesity. She currently denies polyuria or polydipsia.  Hyperlipidemia Sequita has hyperlipidemia and has been trying to improve her cholesterol levels with intensive lifestyle modification including a low saturated fat diet, exercise and weight loss. She is on simvastatin and denies any chest pain.   Vitamin D deficiency Shynia has a diagnosis of Vitamin D deficiency. She is currently taking Vit D and denies nausea, vomiting or muscle weakness.  ASSESSMENT AND PLAN:  Other specified hypothyroidism - Plan: T3, T4, free, TSH, levothyroxine (SYNTHROID) 100 MCG tablet, levothyroxine (SYNTHROID) 88 MCG tablet  Prediabetes - Plan: Comprehensive metabolic panel, Hemoglobin A1c, Insulin, random  Other hyperlipidemia - Plan: Comprehensive  metabolic panel, Lipid Panel With LDL/HDL Ratio  Vitamin D deficiency - Plan: VITAMIN D 25 Hydroxy (Vit-D Deficiency, Fractures)  At risk for diabetes mellitus  Class 1 obesity with serious comorbidity and body mass index (BMI) of 34.0 to 34.9 in adult, unspecified obesity type  PLAN:  Hypothyroidism Analysse was informed of the importance of good thyroid control to help with weight loss efforts. She was also informed that supertheraputic thyroid levels are dangerous and will not improve weight loss results. Ariyonna was given a refill on her levothyroxine 88 mcg and 100 mcg #30 each with 0 refills and agrees to follow-up with our clinic in 2 weeks. She will have labs checked today.  Pre-Diabetes Lynise will continue to work on weight loss, exercise, and decreasing simple carbohydrates in her diet to help decrease the risk of diabetes. We dicussed metformin including benefits and risks. She was informed that eating too many simple carbohydrates or too many calories at one sitting increases the likelihood of GI side effects. Norelle will switch to metformin once daily and will have labs checked today.  Diabetes risk counseling Jazlen was given extended (15 minutes) diabetes prevention counseling today. She is 63 y.o. female and has risk factors for diabetes including obesity. We discussed intensive lifestyle modifications today with an emphasis on weight loss as well as increasing exercise and decreasing simple carbohydrates in her diet.  Hyperlipidemia Aurea was informed of the American Heart Association Guidelines emphasizing intensive lifestyle modifications as the first line treatment for hyperlipidemia. We discussed many lifestyle modifications today in depth, and Sabena will continue to work on decreasing saturated fats such  as fatty red meat, butter and many fried foods. She will continue simvastatin, increase vegetables and lean protein in her diet, and continue to work on exercise and weight loss  efforts. She will have labs checked today.  Vitamin D Deficiency Krystyn was informed that low Vitamin D levels contributes to fatigue and are associated with obesity, breast, and colon cancer. She agrees to continue taking Vit D and will have routine testing of Vitamin D today. She was informed of the risk of over-replacement of Vitamin D and agrees to not increase her dose unless she discusses this with [name] first. Ane agrees to follow-up with our clinic in 2 weeks.  Obesity Giana is currently in the action stage of change. As such, her goal is to continue with weight loss efforts. She has agreed to follow the Category 2 plan. Garry has been instructed to work up to a goal of 150 minutes of combined cardio and strengthening exercise per week for weight loss and overall health benefits. We discussed the following Behavioral Modification Strategies today: work on meal planning, easy cooking plans, and keeping healthy foods in the home.  Eufelia has agreed to follow-up with our clinic in 2 weeks. She was informed of the importance of frequent follow-up visits to maximize her success with intensive lifestyle modifications for her multiple health conditions.  ALLERGIES: Allergies  Allergen Reactions   Sulfa Antibiotics Rash    MEDICATIONS: Current Outpatient Medications on File Prior to Visit  Medication Sig Dispense Refill   acetaminophen (TYLENOL) 500 MG tablet Take 500 mg by mouth every 6 (six) hours as needed.     albuterol (VENTOLIN HFA) 108 (90 Base) MCG/ACT inhaler Inhale 1 puff into the lungs every 6 (six) hours as needed for wheezing or shortness of breath.     ALPRAZolam (XANAX) 0.5 MG tablet Take 0.5 mg by mouth at bedtime as needed for anxiety.     famotidine (PEPCID) 20 MG tablet Take 20 mg by mouth 2 (two) times daily.     hydrochlorothiazide (MICROZIDE) 12.5 MG capsule Take 12.5 mg by mouth daily.     losartan (COZAAR) 50 MG tablet Take 50 mg by mouth daily.     metFORMIN  (GLUCOPHAGE) 500 MG tablet Take 1 tablet (500 mg total) by mouth 2 (two) times daily with a meal. 60 tablet 0   methocarbamol (ROBAXIN) 500 MG tablet methocarbamol 500 mg tablet  1 tablet po at bedtime as needed for muscle spasms     metroNIDAZOLE (METROCREAM) 0.75 % cream Apply 1 application topically 2 (two) times daily.     Multiple Vitamins-Minerals (MULTIVITAMIN WITH MINERALS) tablet Take 1 tablet by mouth daily.     Omega-3 Fatty Acids (FISH OIL) 1000 MG CPDR Take by mouth.     simvastatin (ZOCOR) 40 MG tablet Take 40 mg by mouth daily.     TURMERIC PO Take 450 mg by mouth daily.     VITAMIN D, ERGOCALCIFEROL, PO Take 5,000 mg daily by mouth.     No current facility-administered medications on file prior to visit.     PAST MEDICAL HISTORY: Past Medical History:  Diagnosis Date   Allergy    Anxiety    Aortic atherosclerosis ([facility])    Arthritis    Asthma    Depression    Eczema    Fatty liver    GERD (gastroesophageal reflux disease)    Glaucoma    Graves disease    Heart murmur    Hyperlipidemia  Hypertension    Hypertensive retinopathy    Insomnia    Kidney problem    Obesity    Osteoarthritis    Osteopenia    Prediabetes    Rosacea    Thyroid disease    Vitamin D deficiency    Vocal cord nodules     PAST SURGICAL HISTORY: Past Surgical History:  Procedure Laterality Date   CESAREAN SECTION     x 1   MICROLARYNGOSCOPY Left 11/06/2017   Procedure: MICRO DIRECT LARYNGOSCOPY REMOVAL OF VOCAL CORD;  Surgeon: Jodi Marble, MD;  Location: [place];  Service: ENT;  Laterality: Left;   THYROIDECTOMY, PARTIAL  1975   due to Grave's disease    SOCIAL HISTORY: Social History   Tobacco Use   Smoking status: Former Smoker    Years: 25.00    Types: Cigarettes    Quit date: 10/31/1998    Years since quitting: 20.5   Smokeless tobacco: Never Used  Substance Use Topics   Alcohol use: Yes    Alcohol/week: 2.0  standard drinks    Types: 2 Glasses of wine per week   Drug use: No    FAMILY HISTORY: Family History  Problem Relation Age of Onset   Hypertension Mother    Hyperlipidemia Mother    Diabetes Mother    Thyroid disease Mother    Hypertension Father    Heart disease Father        CHF   Colon cancer Father 46       stage 4   Kidney disease Father    Sleep apnea Father    Obesity Father    Thyroid disease Daughter    Esophageal cancer Neg Hx    Rectal cancer Neg Hx    Liver cancer Neg Hx    Pancreatic cancer Neg Hx    Stomach cancer Neg Hx    ROS: Review of Systems  Cardiovascular: Negative for chest pain.  Gastrointestinal: Negative for diarrhea, nausea and vomiting.  Musculoskeletal:       Negative for muscle weakness.  Endo/Heme/Allergies:       Negative for heat/cold intolerance.   PHYSICAL EXAM: Blood pressure 103/69, pulse 69, temperature 98.4 F (36.9 C), temperature source Oral, height 5\' 3"  (1.6 m), weight 195 lb (88.5 kg), last menstrual period 11/01/2003, SpO2 98 %. Body mass index is 34.54 kg/m. Physical Exam Vitals signs reviewed.  Constitutional:      Appearance: Normal appearance. She is obese.  Cardiovascular:     Rate and Rhythm: Normal rate.     Pulses: Normal pulses.  Pulmonary:     Effort: Pulmonary effort is normal.     Breath sounds: Normal breath sounds.  Musculoskeletal: Normal range of motion.  Skin:    General: Skin is warm and dry.  Neurological:     Mental Status: She is alert and oriented to person, place, and time.  Psychiatric:        Behavior: Behavior normal.   RECENT LABS AND TESTS: BMET    Component Value Date/Time   NA 142 05/02/2019 0915   K 4.2 05/02/2019 0915   CL 105 05/02/2019 0915   CO2 25 05/02/2019 0915   GLUCOSE 98 05/02/2019 0915   GLUCOSE 117 (H) 11/02/2017 1100   BUN 20 05/02/2019 0915   CREATININE 0.70 05/02/2019 0915   CALCIUM 9.4 05/02/2019 0915   GFRNONAA 94 05/02/2019 0915    GFRAA 108 05/02/2019 0915   Lab Results  Component Value Date  HGBA1C 5.5 05/02/2019   HGBA1C 6.3 (H) 09/20/2018   Lab Results  Component Value Date   INSULIN 11.3 05/02/2019   INSULIN 17.2 09/20/2018   CBC    Component Value Date/Time   WBC 10.8 09/20/2018 1050   RBC 4.28 09/20/2018 1050   HGB 13.2 09/20/2018 1050   HCT 38.8 09/20/2018 1050   PLT 243 09/20/2018 1050   MCV 91 09/20/2018 1050   MCH 30.8 09/20/2018 1050   MCHC 34.0 09/20/2018 1050   RDW 12.4 09/20/2018 1050   LYMPHSABS 2.6 09/20/2018 1050   EOSABS 0.1 09/20/2018 1050   BASOSABS 0.0 09/20/2018 1050   Iron/TIBC/Ferritin/ %Sat No results found for: IRON, TIBC, FERRITIN, IRONPCTSAT Lipid Panel     Component Value Date/Time   CHOL 146 05/02/2019 0915   TRIG 124 05/02/2019 0915   HDL 40 05/02/2019 0915   CHOLHDL 3.6 09/20/2018 1050   LDLCALC 81 05/02/2019 0915   Hepatic Function Panel     Component Value Date/Time   PROT 6.7 05/02/2019 0915   ALBUMIN 4.7 05/02/2019 0915   AST 14 05/02/2019 0915   ALT 14 05/02/2019 0915   ALKPHOS 58 05/02/2019 0915   BILITOT 0.5 05/02/2019 0915      Component Value Date/Time   TSH 1.490 05/02/2019 0915   TSH 2.550 11/15/2018 1532   TSH 3.480 09/20/2018 1050   Results for MAYBEL, DAMBROSIO (MRN [id number]) as of 05/07/2019 09:24  Ref. Range 09/20/2018 10:50  Vitamin D, 25-Hydroxy Latest Ref Range: 30.0 - 100.0 ng/mL 60.2   OBESITY BEHAVIORAL INTERVENTION VISIT  Today's visit was #13  Starting weight: 219 lbs Starting date: 09/20/2018 Today's weight: 195 lbs  Today's date: 05/02/2019 Total lbs lost to date: 24  ASK: We discussed the diagnosis of obesity with [name] today and Jakeisha agreed to give [name] permission to discuss obesity behavioral modification therapy today.  ASSESS: Braylen has the diagnosis of obesity and her BMI today is 34.5. Tashia is in the action stage of change.   ADVISE: Huda was educated on the multiple health risks of  obesity as well as the benefit of weight loss to improve her health. She was advised of the need for long term treatment and the importance of lifestyle modifications to improve her current health and to decrease her risk of future health problems.  AGREE: Multiple dietary modification options and treatment options were discussed and  Avigayil agreed to follow the recommendations documented in the above note.  ARRANGE: Kenitha was educated on the importance of frequent visits to treat obesity as outlined per CMS and USPSTF guidelines and agreed to schedule her next follow up appointment today.  Migdalia Dk, am acting as transcriptionist for Abby Potash, PA-C I, Abby Potash, PA-C have reviewed above note and agree with its content

## 2019-05-16 ENCOUNTER — Other Ambulatory Visit: Payer: Self-pay

## 2019-05-16 ENCOUNTER — Ambulatory Visit (INDEPENDENT_AMBULATORY_CARE_PROVIDER_SITE_OTHER): Payer: 59 | Admitting: Physician Assistant

## 2019-05-16 VITALS — BP 119/62 | HR 65 | Temp 98.1°F | Ht 63.0 in | Wt 194.0 lb

## 2019-05-16 DIAGNOSIS — Z9189 Other specified personal risk factors, not elsewhere classified: Secondary | ICD-10-CM | POA: Diagnosis not present

## 2019-05-16 DIAGNOSIS — Z6834 Body mass index (BMI) 34.0-34.9, adult: Secondary | ICD-10-CM

## 2019-05-16 DIAGNOSIS — E559 Vitamin D deficiency, unspecified: Secondary | ICD-10-CM | POA: Diagnosis not present

## 2019-05-16 DIAGNOSIS — Z6841 Body Mass Index (BMI) 40.0 and over, adult: Secondary | ICD-10-CM | POA: Diagnosis not present

## 2019-05-16 DIAGNOSIS — R7303 Prediabetes: Secondary | ICD-10-CM

## 2019-05-16 DIAGNOSIS — E669 Obesity, unspecified: Secondary | ICD-10-CM

## 2019-05-16 DIAGNOSIS — M1712 Unilateral primary osteoarthritis, left knee: Secondary | ICD-10-CM | POA: Diagnosis not present

## 2019-05-16 MED ORDER — METFORMIN HCL 500 MG PO TABS: 500.0000 mg | ORAL_TABLET | Freq: Two times a day (BID) | ORAL | 0 refills | Status: DC

## 2019-05-16 MED FILL — metFORMIN HCL 500 MG TABS: 500 | 30 days supply | Qty: 60 | Fill #0

## 2019-05-20 NOTE — Progress Notes (Signed)
Office: [phone]  /  Fax: [phone]   HPI:   Chief Complaint: OBESITY Morgan Williams is here to discuss her progress with her obesity treatment plan. She is on the Category 2 plan and is following her eating plan approximately 60% of the time. She states she is exercising 0 minutes 0 times per week. Sharni reports that she has done better in general with the plan but is overeating her snacks. She is traveling to the beach for 2 weeks.  Her weight is 194 lb (88 kg) today and has had a weight loss of 1 pound over a period of 2 weeks since her last visit. She has lost 25 lbs since starting treatment with [name].  Vitamin D deficiency Morgan Williams has a diagnosis of Vitamin D deficiency. Her last level was at goal (71.7 on 05/02/2019) and she is at risk for oversupplementation. She is currently taking Vit D and denies nausea, vomiting or muscle weakness.  Pre-Diabetes Morgan Williams has a diagnosis of prediabetes based on her elevated Hgb A1c and was informed this puts her at greater risk of developing diabetes. She is taking metformin currently and continues to work on diet and exercise to decrease risk of diabetes. She denies nausea, vomiting, or diarrhea on metformin. No polyphagia.  At risk for diabetes Morgan Williams is at higher than averagerisk for developing diabetes due to her obesity. She currently denies polyuria or polydipsia.  ASSESSMENT AND PLAN:  Prediabetes - Plan: metFORMIN (GLUCOPHAGE) 500 MG tablet  Vitamin D deficiency  At risk for diabetes mellitus  Class 1 obesity with serious comorbidity and body mass index (BMI) of 34.0 to 34.9 in adult, unspecified obesity type  PLAN:  Vitamin D Deficiency Morgan Williams was informed that low Vitamin D levels contributes to fatigue and are associated with obesity, breast, and colon cancer. She agrees to change to taking OTC Vit D @ 5,000 units every other day and will follow-up for routine testing of Vitamin D, at least 2-3 times per year. She was informed of the  risk of over-replacement of Vitamin D and agrees to not increase her dose unless she discusses this with [name] first. Morgan Williams agrees to follow-up with our clinic in 4 weeks.  Pre-Diabetes Morgan Williams will continue to work on weight loss, exercise, and decreasing simple carbohydrates in her diet to help decrease the risk of diabetes. We dicussed metformin including benefits and risks. She was informed that eating too many simple carbohydrates or too many calories at one sitting increases the likelihood of GI side effects. Morgan Williams was given a refill on her metformin #30 with 0 refills and agrees to follow-up with our clinic in 4 weeks.  Diabetes risk counseling Morgan Williams was given extended (15 minutes) diabetes prevention counseling today. She is 63 y.o. female and has risk factors for diabetes including obesity. We discussed intensive lifestyle modifications today with an emphasis on weight loss as well as increasing exercise and decreasing simple carbohydrates in her diet.  Obesity Morgan Williams is currently in the action stage of change. As such, her goal is to continue with weight loss efforts. She has agreed to follow the Category 2 plan. Morgan Williams has been instructed to work up to a goal of 150 minutes of combined cardio and strengthening exercise per week for weight loss and overall health benefits. We discussed the following Behavioral Modification Strategies today: work on meal planning and easy cooking plans, keeping healthy foods in the home, and travel eating strategies.  Morgan Williams has agreed to follow-up with our clinic in 4  weeks. She was informed of the importance of frequent follow-up visits to maximize her success with intensive lifestyle modifications for her multiple health conditions.  ALLERGIES: Allergies  Allergen Reactions   Sulfa Antibiotics Rash    MEDICATIONS: Current Outpatient Medications on File Prior to Visit  Medication Sig Dispense Refill   acetaminophen (TYLENOL) 500 MG tablet Take 500 mg  by mouth every 6 (six) hours as needed.     albuterol (VENTOLIN HFA) 108 (90 Base) MCG/ACT inhaler Inhale 1 puff into the lungs every 6 (six) hours as needed for wheezing or shortness of breath.     ALPRAZolam (XANAX) 0.5 MG tablet Take 0.5 mg by mouth at bedtime as needed for anxiety.     famotidine (PEPCID) 20 MG tablet Take 20 mg by mouth 2 (two) times daily.     hydrochlorothiazide (MICROZIDE) 12.5 MG capsule Take 12.5 mg by mouth daily.     levothyroxine (SYNTHROID) 100 MCG tablet Take 1 tablet (100 mcg total) by mouth daily. 30 tablet 0   levothyroxine (SYNTHROID) 88 MCG tablet Take 1 tablet (88 mcg total) by mouth daily before breakfast. 30 tablet 0   losartan (COZAAR) 50 MG tablet Take 50 mg by mouth daily.     methocarbamol (ROBAXIN) 500 MG tablet methocarbamol 500 mg tablet  1 tablet po at bedtime as needed for muscle spasms     metroNIDAZOLE (METROCREAM) 0.75 % cream Apply 1 application topically 2 (two) times daily.     Multiple Vitamins-Minerals (MULTIVITAMIN WITH MINERALS) tablet Take 1 tablet by mouth daily.     Omega-3 Fatty Acids (FISH OIL) 1000 MG CPDR Take by mouth.     simvastatin (ZOCOR) 40 MG tablet Take 40 mg by mouth daily.     TURMERIC PO Take 450 mg by mouth daily.     VITAMIN D, ERGOCALCIFEROL, PO Take 5,000 mg by mouth every other day.      No current facility-administered medications on file prior to visit.     PAST MEDICAL HISTORY: Past Medical History:  Diagnosis Date   Allergy    Anxiety    Aortic atherosclerosis (HCC)    Arthritis    Asthma    Depression    Eczema    Fatty liver    GERD (gastroesophageal reflux disease)    Glaucoma    Graves disease    Heart murmur    Hyperlipidemia    Hypertension    Hypertensive retinopathy    Insomnia    Kidney problem    Obesity    Osteoarthritis    Osteopenia    Prediabetes    Rosacea    Thyroid disease    Vitamin D deficiency    Vocal cord nodules     PAST  SURGICAL HISTORY: Past Surgical History:  Procedure Laterality Date   CESAREAN SECTION     x 1   MICROLARYNGOSCOPY Left 11/06/2017   Procedure: MICRO DIRECT LARYNGOSCOPY REMOVAL OF VOCAL CORD;  Surgeon: Jodi Marble, MD;  Location: [facility];  Service: ENT;  Laterality: Left;   THYROIDECTOMY, PARTIAL  1975   due to Grave's disease    SOCIAL HISTORY: Social History   Tobacco Use   Smoking status: Former Smoker    Years: 25.00    Types: Cigarettes    Quit date: 10/31/1998    Years since quitting: 20.5   Smokeless tobacco: Never Used  Substance Use Topics   Alcohol use: Yes    Alcohol/week: 2.0 standard drinks    Types:  2 Glasses of wine per week   Drug use: No    FAMILY HISTORY: Family History  Problem Relation Age of Onset   Hypertension Mother    Hyperlipidemia Mother    Diabetes Mother    Thyroid disease Mother    Hypertension Father    Heart disease Father        CHF   Colon cancer Father 10       stage 4   Kidney disease Father    Sleep apnea Father    Obesity Father    Thyroid disease Daughter    Esophageal cancer Neg Hx    Rectal cancer Neg Hx    Liver cancer Neg Hx    Pancreatic cancer Neg Hx    Stomach cancer Neg Hx    ROS: Review of Systems  Gastrointestinal: Negative for diarrhea, nausea and vomiting.  Musculoskeletal:       Negative for muscle weakness.  Endo/Heme/Allergies:       Negative for polyphagia.   PHYSICAL EXAM: Blood pressure 119/62, pulse 65, temperature 98.1 F (36.7 C), temperature source Oral, height 5\' 3"  (1.6 m), weight 194 lb (88 kg), last menstrual period 11/01/2003, SpO2 99 %. Body mass index is 34.37 kg/m. Physical Exam Vitals signs reviewed.  Constitutional:      Appearance: Normal appearance. She is obese.  Cardiovascular:     Rate and Rhythm: Normal rate.     Pulses: Normal pulses.  Pulmonary:     Effort: Pulmonary effort is normal.     Breath sounds: Normal breath sounds.    Musculoskeletal: Normal range of motion.  Skin:    General: Skin is warm and dry.  Neurological:     Mental Status: She is alert and oriented to person, place, and time.  Psychiatric:        Behavior: Behavior normal.   RECENT LABS AND TESTS: BMET    Component Value Date/Time   NA 142 05/02/2019 0915   K 4.2 05/02/2019 0915   CL 105 05/02/2019 0915   CO2 25 05/02/2019 0915   GLUCOSE 98 05/02/2019 0915   GLUCOSE 117 (H) 11/02/2017 1100   BUN 20 05/02/2019 0915   CREATININE 0.70 05/02/2019 0915   CALCIUM 9.4 05/02/2019 0915   GFRNONAA 94 05/02/2019 0915   GFRAA 108 05/02/2019 0915   Lab Results  Component Value Date   HGBA1C 5.5 05/02/2019   HGBA1C 6.3 (H) 09/20/2018   Lab Results  Component Value Date   INSULIN 11.3 05/02/2019   INSULIN 17.2 09/20/2018   CBC    Component Value Date/Time   WBC 10.8 09/20/2018 1050   RBC 4.28 09/20/2018 1050   HGB 13.2 09/20/2018 1050   HCT 38.8 09/20/2018 1050   PLT 243 09/20/2018 1050   MCV 91 09/20/2018 1050   MCH 30.8 09/20/2018 1050   MCHC 34.0 09/20/2018 1050   RDW 12.4 09/20/2018 1050   LYMPHSABS 2.6 09/20/2018 1050   EOSABS 0.1 09/20/2018 1050   BASOSABS 0.0 09/20/2018 1050   Iron/TIBC/Ferritin/ %Sat No results found for: IRON, TIBC, FERRITIN, IRONPCTSAT Lipid Panel     Component Value Date/Time   CHOL 146 05/02/2019 0915   TRIG 124 05/02/2019 0915   HDL 40 05/02/2019 0915   CHOLHDL 3.6 09/20/2018 1050   LDLCALC 81 05/02/2019 0915   Hepatic Function Panel     Component Value Date/Time   PROT 6.7 05/02/2019 0915   ALBUMIN 4.7 05/02/2019 0915   AST 14 05/02/2019 0915   ALT 14  05/02/2019 0915   ALKPHOS 58 05/02/2019 0915   BILITOT 0.5 05/02/2019 0915      Component Value Date/Time   TSH 1.490 05/02/2019 0915   TSH 2.550 11/15/2018 1532   TSH 3.480 09/20/2018 1050   Results for NEFERTARI, REBMAN (MRN [id number]) as of 05/20/2019 08:18  Ref. Range 05/02/2019 09:15  Vitamin D, 25-Hydroxy Latest Ref Range:  30.0 - 100.0 ng/mL 71.7   OBESITY BEHAVIORAL INTERVENTION VISIT  Today's visit was #14  Starting weight: 219 lbs Starting date: 09/20/2018 Today's weight: 194 lbs  Today's date: 05/16/2019 Total lbs lost to date: 25   05/16/2019  Height 5\' 3"  (1.6 m)  Weight 194 lb (88 kg)  BMI (Calculated) 34.37  BLOOD PRESSURE - SYSTOLIC 767  BLOOD PRESSURE - DIASTOLIC 62   Body Fat % 20.9 %  Total Body Water (lbs) 78.2 lbs   ASK: We discussed the diagnosis of obesity with Morgan Williams today and Morgan Williams agreed to give [name] permission to discuss obesity behavioral modification therapy today.  ASSESS: Jaidence has the diagnosis of obesity and her BMI today is 34.5. Morgan Williams is in the action stage of change.   ADVISE: Morgan Williams was educated on the multiple health risks of obesity as well as the benefit of weight loss to improve her health. She was advised of the need for long term treatment and the importance of lifestyle modifications to improve her current health and to decrease her risk of future health problems.  AGREE: Multiple dietary modification options and treatment options were discussed and  Morgan Williams agreed to follow the recommendations documented in the above note.  ARRANGE: Morgan Williams was educated on the importance of frequent visits to treat obesity as outlined per CMS and USPSTF guidelines and agreed to schedule her next follow up appointment today.  Morgan Williams, am acting as transcriptionist for Abby Potash, PA-C I, Abby Potash, PA-C have reviewed above note and agree with its content

## 2019-06-13 ENCOUNTER — Encounter (INDEPENDENT_AMBULATORY_CARE_PROVIDER_SITE_OTHER): Payer: Self-pay | Admitting: Physician Assistant

## 2019-06-13 ENCOUNTER — Ambulatory Visit (INDEPENDENT_AMBULATORY_CARE_PROVIDER_SITE_OTHER): Payer: 59 | Admitting: Physician Assistant

## 2019-06-13 ENCOUNTER — Other Ambulatory Visit: Payer: Self-pay

## 2019-06-13 VITALS — BP 106/70 | HR 69 | Temp 98.3°F | Ht 63.0 in | Wt 197.0 lb

## 2019-06-13 DIAGNOSIS — E7849 Other hyperlipidemia: Secondary | ICD-10-CM | POA: Diagnosis not present

## 2019-06-13 DIAGNOSIS — Z6835 Body mass index (BMI) 35.0-35.9, adult: Secondary | ICD-10-CM

## 2019-06-17 NOTE — Progress Notes (Signed)
Office: [phone]  /  Fax: [phone]   HPI:   Chief Complaint: OBESITY Morgan Williams is here to discuss her progress with her obesity treatment plan. She is on the Category 2 plan and is following her eating plan approximately 40% of the time. She states she is exercising 0 minutes 0 times per week. Morgan Williams just returned from the beach where she did not follow the plan. She is ready to get back on track. Her weight is 197 lb (89.4 kg) today and has had a weight gain of 3 lbs since her last visit. She has lost 22 lbs since starting treatment with [name].  Hyperlipidemia Morgan Williams has hyperlipidemia and has been trying to improve her cholesterol levels with intensive lifestyle modification including a low saturated fat diet, exercise and weight loss. She is on simvastatin and denies any chest pain.  ASSESSMENT AND PLAN:  Other hyperlipidemia  Class 2 severe obesity with serious comorbidity and body mass index (BMI) of 35.0 to 35.9 in adult, unspecified obesity type (Morgan Williams)  PLAN:  Hyperlipidemia Morgan Williams was informed of the American Heart Association Guidelines emphasizing intensive lifestyle modifications as the first line treatment for hyperlipidemia. We discussed many lifestyle modifications today in depth, and Morgan Williams will continue to work on decreasing saturated fats such as fatty red meat, butter and many fried foods. She will also increase vegetables and lean protein in her diet and continue to work on exercise and weight loss efforts.  I spent > than 50% of the 15 minute visit on counseling as documented in the note.  Obesity Morgan Williams is currently in the action stage of change. As such, her goal is to continue with weight loss efforts. She has agreed to follow the Category 2 plan. Morgan Williams has been instructed to work up to a goal of 150 minutes of combined cardio and strengthening exercise per week for weight loss and overall health benefits. We discussed the following Behavioral Modification  Strategies today: work on meal planning and easy cooking plans, and keeping healthy foods in the home.  Morgan Williams has agreed to follow-up with our clinic in 2 weeks. She was informed of the importance of frequent follow-up visits to maximize her success with intensive lifestyle modifications for her multiple health conditions.  ALLERGIES: Allergies  Allergen Reactions   Sulfa Antibiotics Rash    MEDICATIONS: Current Outpatient Medications on File Prior to Visit  Medication Sig Dispense Refill   acetaminophen (TYLENOL) 500 MG tablet Take 500 mg by mouth every 6 (six) hours as needed.     albuterol (VENTOLIN HFA) 108 (90 Base) MCG/ACT inhaler Inhale 1 puff into the lungs every 6 (six) hours as needed for wheezing or shortness of breath.     ALPRAZolam (XANAX) 0.5 MG tablet Take 0.5 mg by mouth at bedtime as needed for anxiety.     famotidine (PEPCID) 20 MG tablet Take 20 mg by mouth 2 (two) times daily.     hydrochlorothiazide (MICROZIDE) 12.5 MG capsule Take 12.5 mg by mouth daily.     levothyroxine (SYNTHROID) 100 MCG tablet Take 1 tablet (100 mcg total) by mouth daily. 30 tablet 0   levothyroxine (SYNTHROID) 88 MCG tablet Take 1 tablet (88 mcg total) by mouth daily before breakfast. 30 tablet 0   losartan (COZAAR) 50 MG tablet Take 50 mg by mouth daily.     metFORMIN (GLUCOPHAGE) 500 MG tablet Take 1 tablet (500 mg total) by mouth 2 (two) times daily with a meal. 60 tablet 0   methocarbamol (ROBAXIN)  500 MG tablet methocarbamol 500 mg tablet  1 tablet po at bedtime as needed for muscle spasms     metroNIDAZOLE (METROCREAM) 0.75 % cream Apply 1 application topically 2 (two) times daily.     Multiple Vitamins-Minerals (MULTIVITAMIN WITH MINERALS) tablet Take 1 tablet by mouth daily.     Omega-3 Fatty Acids (FISH OIL) 1000 MG CPDR Take by mouth.     simvastatin (ZOCOR) 40 MG tablet Take 40 mg by mouth daily.     TURMERIC PO Take 450 mg by mouth daily.     VITAMIN D,  ERGOCALCIFEROL, PO Take 5,000 mg by mouth every other day.      No current facility-administered medications on file prior to visit.     PAST MEDICAL HISTORY: Past Medical History:  Diagnosis Date   Allergy    Anxiety    Aortic atherosclerosis (HCC)    Arthritis    Asthma    Depression    Eczema    Fatty liver    GERD (gastroesophageal reflux disease)    Glaucoma    Graves disease    Heart murmur    Hyperlipidemia    Hypertension    Hypertensive retinopathy    Insomnia    Kidney problem    Obesity    Osteoarthritis    Osteopenia    Prediabetes    Rosacea    Thyroid disease    Vitamin D deficiency    Vocal cord nodules     PAST SURGICAL HISTORY: Past Surgical History:  Procedure Laterality Date   CESAREAN SECTION     x 1   MICROLARYNGOSCOPY Left 11/06/2017   Procedure: MICRO DIRECT LARYNGOSCOPY REMOVAL OF VOCAL CORD;  Surgeon: Jodi Marble, MD;  Location: [facility];  Service: ENT;  Laterality: Left;   THYROIDECTOMY, PARTIAL  1975   due to Grave's disease    SOCIAL HISTORY: Social History   Tobacco Use   Smoking status: Former Smoker    Years: 25.00    Types: Cigarettes    Quit date: 10/31/1998    Years since quitting: 20.6   Smokeless tobacco: Never Used  Substance Use Topics   Alcohol use: Yes    Alcohol/week: 2.0 standard drinks    Types: 2 Glasses of wine per week   Drug use: No    FAMILY HISTORY: Family History  Problem Relation Age of Onset   Hypertension Mother    Hyperlipidemia Mother    Diabetes Mother    Thyroid disease Mother    Hypertension Father    Heart disease Father        CHF   Colon cancer Father 71       stage 4   Kidney disease Father    Sleep apnea Father    Obesity Father    Thyroid disease Daughter    Esophageal cancer Neg Hx    Rectal cancer Neg Hx    Liver cancer Neg Hx    Pancreatic cancer Neg Hx    Stomach cancer Neg Hx    ROS: Review of Systems   Cardiovascular: Negative for chest pain.   PHYSICAL EXAM: Blood pressure 106/70, pulse 69, temperature 98.3 F (36.8 C), temperature source Oral, height 5\' 3"  (1.6 m), weight 197 lb (89.4 kg), last menstrual period 11/01/2003, SpO2 98 %. Body mass index is 34.9 kg/m. Physical Exam Vitals signs reviewed.  Constitutional:      Appearance: Normal appearance. She is obese.  Cardiovascular:     Rate and Rhythm:  Normal rate.     Pulses: Normal pulses.  Pulmonary:     Effort: Pulmonary effort is normal.     Breath sounds: Normal breath sounds.  Musculoskeletal: Normal range of motion.  Skin:    General: Skin is warm and dry.  Neurological:     Mental Status: She is alert and oriented to person, place, and time.  Psychiatric:        Behavior: Behavior normal.   RECENT LABS AND TESTS: BMET    Component Value Date/Time   NA 142 05/02/2019 0915   K 4.2 05/02/2019 0915   CL 105 05/02/2019 0915   CO2 25 05/02/2019 0915   GLUCOSE 98 05/02/2019 0915   GLUCOSE 117 (H) 11/02/2017 1100   BUN 20 05/02/2019 0915   CREATININE 0.70 05/02/2019 0915   CALCIUM 9.4 05/02/2019 0915   GFRNONAA 94 05/02/2019 0915   GFRAA 108 05/02/2019 0915   Lab Results  Component Value Date   HGBA1C 5.5 05/02/2019   HGBA1C 6.3 (H) 09/20/2018   Lab Results  Component Value Date   INSULIN 11.3 05/02/2019   INSULIN 17.2 09/20/2018   CBC    Component Value Date/Time   WBC 10.8 09/20/2018 1050   RBC 4.28 09/20/2018 1050   HGB 13.2 09/20/2018 1050   HCT 38.8 09/20/2018 1050   PLT 243 09/20/2018 1050   MCV 91 09/20/2018 1050   MCH 30.8 09/20/2018 1050   MCHC 34.0 09/20/2018 1050   RDW 12.4 09/20/2018 1050   LYMPHSABS 2.6 09/20/2018 1050   EOSABS 0.1 09/20/2018 1050   BASOSABS 0.0 09/20/2018 1050   Iron/TIBC/Ferritin/ %Sat No results found for: IRON, TIBC, FERRITIN, IRONPCTSAT Lipid Panel     Component Value Date/Time   CHOL 146 05/02/2019 0915   TRIG 124 05/02/2019 0915   HDL 40 05/02/2019  0915   CHOLHDL 3.6 09/20/2018 1050   LDLCALC 81 05/02/2019 0915   Hepatic Function Panel     Component Value Date/Time   PROT 6.7 05/02/2019 0915   ALBUMIN 4.7 05/02/2019 0915   AST 14 05/02/2019 0915   ALT 14 05/02/2019 0915   ALKPHOS 58 05/02/2019 0915   BILITOT 0.5 05/02/2019 0915      Component Value Date/Time   TSH 1.490 05/02/2019 0915   TSH 2.550 11/15/2018 1532   TSH 3.480 09/20/2018 1050   Results for JOYE, WESENBERG (MRN [id number]) as of 06/17/2019 08:29  Ref. Range 05/02/2019 09:15  Vitamin D, 25-Hydroxy Latest Ref Range: 30.0 - 100.0 ng/mL 71.7    OBESITY BEHAVIORAL INTERVENTION VISIT  Today's visit was #15   Starting weight: 219 lbs Starting date: 09/20/2018 Today's weight: 197 lbs Today's date: 06/13/2019 Total lbs lost to date: 22    06/13/2019  Height 5\' 3"  (1.6 m)  Weight 197 lb (89.4 kg)  BMI (Calculated) 34.91  BLOOD PRESSURE - SYSTOLIC 947  BLOOD PRESSURE - DIASTOLIC 70   Body Fat % 65.4 %  Total Body Water (lbs) 75.8 lbs   ASK: We discussed the diagnosis of obesity with [name] today and Corlene agreed to give [name] permission to discuss obesity behavioral modification therapy today.  ASSESS: Keshona has the diagnosis of obesity and her BMI today is 35.0. Asley is in the action stage of change.   ADVISE: Gerilynn was educated on the multiple health risks of obesity as well as the benefit of weight loss to improve her health. She was advised of the need for long term treatment and the importance of lifestyle  modifications to improve her current health and to decrease her risk of future health problems.  AGREE: Multiple dietary modification options and treatment options were discussed and  Rylen agreed to follow the recommendations documented in the above note.  ARRANGE: Tiffannie was educated on the importance of frequent visits to treat obesity as outlined per CMS and USPSTF guidelines and agreed to schedule her next follow up appointment  today.  Migdalia Dk, am acting as transcriptionist for Abby Potash, PA-C I, Abby Potash, PA-C have reviewed above note and agree with its content

## 2019-06-20 MED FILL — SM ACID REDUCER 20 MG TAB: 20 | 75 days supply | Qty: 150 | Fill #2

## 2019-06-25 ENCOUNTER — Encounter (INDEPENDENT_AMBULATORY_CARE_PROVIDER_SITE_OTHER): Payer: Self-pay

## 2019-06-27 ENCOUNTER — Ambulatory Visit (INDEPENDENT_AMBULATORY_CARE_PROVIDER_SITE_OTHER): Payer: 59 | Admitting: Physician Assistant

## 2019-06-27 ENCOUNTER — Other Ambulatory Visit: Payer: Self-pay

## 2019-06-27 ENCOUNTER — Encounter (INDEPENDENT_AMBULATORY_CARE_PROVIDER_SITE_OTHER): Payer: Self-pay | Admitting: Physician Assistant

## 2019-06-27 VITALS — BP 112/70 | HR 65 | Temp 98.6°F | Ht 63.0 in | Wt 197.0 lb

## 2019-06-27 DIAGNOSIS — E669 Obesity, unspecified: Secondary | ICD-10-CM | POA: Diagnosis not present

## 2019-06-27 DIAGNOSIS — Z9189 Other specified personal risk factors, not elsewhere classified: Secondary | ICD-10-CM

## 2019-06-27 DIAGNOSIS — R7303 Prediabetes: Secondary | ICD-10-CM

## 2019-06-27 DIAGNOSIS — Z6834 Body mass index (BMI) 34.0-34.9, adult: Secondary | ICD-10-CM | POA: Diagnosis not present

## 2019-06-27 DIAGNOSIS — E038 Other specified hypothyroidism: Secondary | ICD-10-CM | POA: Diagnosis not present

## 2019-06-27 MED ORDER — METFORMIN HCL 500 MG PO TABS: 500.0000 mg | ORAL_TABLET | Freq: Two times a day (BID) | ORAL | 0 refills | Status: DC

## 2019-06-27 MED ORDER — LEVOTHYROXINE SODIUM 88 MCG PO TABS: 88.0000 ug | ORAL_TABLET | Freq: Every day | ORAL | 0 refills | Status: DC

## 2019-06-27 MED ORDER — LEVOTHYROXINE SODIUM 100 MCG PO TABS: 100.0000 ug | ORAL_TABLET | Freq: Every day | ORAL | 0 refills | Status: DC

## 2019-06-27 MED FILL — metFORMIN HCL 500 MG TABS: 500 | 30 days supply | Qty: 60 | Fill #0

## 2019-06-27 MED FILL — LEVOTHYROXINE 88 MCG TABLET: 88 | 15 days supply | Qty: 15 | Fill #0

## 2019-06-27 MED FILL — LEVOTHYROXINE 100 MCG TABLE: 100 | 15 days supply | Qty: 15 | Fill #0

## 2019-06-30 NOTE — Progress Notes (Signed)
Office: [phone]  /  Fax: [phone]   HPI:   Chief Complaint: OBESITY Morgan Williams is here to discuss her progress with her obesity treatment plan. She is on the Category 2 plan and is following her eating plan approximately 70 % of the time. She states she is exercising 0 minutes 0 times per week. Morgan Williams reports that she has been journaling more often. She is going over her calories but not meeting her protein goal.  Her weight is 197 lb (89.4 kg) today and has not lost weight since her last visit. She has lost 22 lbs since starting treatment with [name].  Pre-Diabetes Morgan Williams has a diagnosis of pre-diabetes based on her elevated Hgb A1c and was informed this puts her at greater risk of developing diabetes. She is taking metformin currently and denies nausea, vomiting, or diarrhea. She continues to work on diet and exercise to decrease risk of diabetes. She denies polyphagia or hypoglycemia.  At risk for diabetes Morgan Williams is at higher than average risk for developing diabetes due to her obesity and pre-diabetes. She currently denies polyuria or polydipsia.  Hypothyroidism Morgan Williams has a diagnosis of hypothyroidism. She is currently on levothyroxine. She denies hot or cold intolerance or palpitations.  ASSESSMENT AND PLAN:  Prediabetes - Plan: metFORMIN (GLUCOPHAGE) 500 MG tablet  Other specified hypothyroidism - Plan: levothyroxine (SYNTHROID) 100 MCG tablet, levothyroxine (SYNTHROID) 88 MCG tablet  At risk for diabetes mellitus  Class 1 obesity with serious comorbidity and body mass index (BMI) of 34.0 to 34.9 in adult, unspecified obesity type  PLAN:  Pre-Diabetes Morgan Williams will continue to work on weight loss, exercise, and decreasing simple carbohydrates in her diet to help decrease the risk of diabetes. We dicussed metformin including benefits and risks. She was informed that eating too many simple carbohydrates or too many calories at one sitting increases the likelihood of GI side effects.  Morgan Williams agrees to continue taking metformin 500 mg BID #60 and we will refill for 1 month. Morgan Williams agrees to follow up with our clinic in 2 weeks as directed to monitor her progress.  Diabetes risk counseling Morgan Williams was given extended (15 minutes) diabetes prevention counseling today. She is 63 y.o. female and has risk factors for diabetes including obesity and pre-diabetes. We discussed intensive lifestyle modifications today with an emphasis on weight loss as well as increasing exercise and decreasing simple carbohydrates in her diet.  Hypothyroidism Morgan Williams was informed of the importance of good thyroid control to help with weight loss efforts. She was also informed that supertheraputic thyroid levels are dangerous and will not improve weight loss results. Morgan Williams agrees to continue taking levothyroxine 88 mcg q AM #15, and she agrees to continue taking levothyroxine 100 mcg #15 and we will refill both for 1 month. Morgan Williams agrees to follow up with our clinic in 2 weeks.  Obesity Morgan Williams is currently in the action stage of change. As such, her goal is to continue with weight loss efforts She has agreed to keep a food journal with 1200 calories and 85 grams of protein daily Morgan Williams has been instructed to work up to a goal of 150 minutes of combined cardio and strengthening exercise per week for weight loss and overall health benefits. We discussed the following Behavioral Modification Strategies today: work on meal planning and easy cooking plans  And keeping healthy foods in the home  Morgan Williams has agreed to follow up with our clinic in 2 weeks. She was informed of the importance of frequent follow up  visits to maximize her success with intensive lifestyle modifications for her multiple health conditions.  ALLERGIES: Allergies  Allergen Reactions  . Sulfa Antibiotics Rash    MEDICATIONS: Current Outpatient Medications on File Prior to Visit  Medication Sig Dispense Refill  . acetaminophen (TYLENOL) 500 MG  tablet Take 500 mg by mouth every 6 (six) hours as needed.    Marland Kitchen albuterol (VENTOLIN HFA) 108 (90 Base) MCG/ACT inhaler Inhale 1 puff into the lungs every 6 (six) hours as needed for wheezing or shortness of breath.    . ALPRAZolam (XANAX) 0.5 MG tablet Take 0.5 mg by mouth at bedtime as needed for anxiety.    . famotidine (PEPCID) 20 MG tablet Take 20 mg by mouth 2 (two) times daily.    . hydrochlorothiazide (MICROZIDE) 12.5 MG capsule Take 12.5 mg by mouth daily.    Marland Kitchen losartan (COZAAR) 50 MG tablet Take 50 mg by mouth daily.    . methocarbamol (ROBAXIN) 500 MG tablet methocarbamol 500 mg tablet  1 tablet po at bedtime as needed for muscle spasms    . metroNIDAZOLE (METROCREAM) 0.75 % cream Apply 1 application topically 2 (two) times daily.    . Multiple Vitamins-Minerals (MULTIVITAMIN WITH MINERALS) tablet Take 1 tablet by mouth daily.    . Omega-3 Fatty Acids (FISH OIL) 1000 MG CPDR Take by mouth.    . simvastatin (ZOCOR) 40 MG tablet Take 40 mg by mouth daily.    . TURMERIC PO Take 450 mg by mouth daily.    Marland Kitchen VITAMIN D, ERGOCALCIFEROL, PO Take 5,000 mg by mouth every other day.      No current facility-administered medications on file prior to visit.     PAST MEDICAL HISTORY: Past Medical History:  Diagnosis Date  . Allergy   . Anxiety   . Aortic atherosclerosis ([place])   . Arthritis   . Asthma   . Depression   . Eczema   . Fatty liver   . GERD (gastroesophageal reflux disease)   . Glaucoma   . Graves disease   . Heart murmur   . Hyperlipidemia   . Hypertension   . Hypertensive retinopathy   . Insomnia   . Kidney problem   . Obesity   . Osteoarthritis   . Osteopenia   . Prediabetes   . Rosacea   . Thyroid disease   . Vitamin D deficiency   . Vocal cord nodules     PAST SURGICAL HISTORY: Past Surgical History:  Procedure Laterality Date  . CESAREAN SECTION     x 1  . MICROLARYNGOSCOPY Left 11/06/2017   Procedure: MICRO DIRECT LARYNGOSCOPY REMOVAL OF VOCAL CORD;   Surgeon: Jodi Marble, MD;  Location: [facility];  Service: ENT;  Laterality: Left;  . THYROIDECTOMY, PARTIAL  1975   due to Grave's disease    SOCIAL HISTORY: Social History   Tobacco Use  . Smoking status: Former Smoker    Years: 25.00    Types: Cigarettes    Quit date: 10/31/1998    Years since quitting: 20.6  . Smokeless tobacco: Never Used  Substance Use Topics  . Alcohol use: Yes    Alcohol/week: 2.0 standard drinks    Types: 2 Glasses of wine per week  . Drug use: No    FAMILY HISTORY: Family History  Problem Relation Age of Onset  . Hypertension Mother   . Hyperlipidemia Mother   . Diabetes Mother   . Thyroid disease Mother   . Hypertension Father   .  Heart disease Father        CHF  . Colon cancer Father 78       stage 4  . Kidney disease Father   . Sleep apnea Father   . Obesity Father   . Thyroid disease Daughter   . Esophageal cancer Neg Hx   . Rectal cancer Neg Hx   . Liver cancer Neg Hx   . Pancreatic cancer Neg Hx   . Stomach cancer Neg Hx     ROS: Review of Systems  Constitutional: Negative for weight loss.  Cardiovascular: Negative for palpitations.  Gastrointestinal: Negative for diarrhea, nausea and vomiting.  Genitourinary: Negative for frequency.  Endo/Heme/Allergies: Negative for polydipsia.       Negative polyphagia Negative hypoglycemia Negative hot/cold intolerance    PHYSICAL EXAM: Blood pressure 112/70, pulse 65, temperature 98.6 F (37 C), temperature source Oral, height 5\' 3"  (1.6 m), weight 197 lb (89.4 kg), last menstrual period 11/01/2003, SpO2 96 %. Body mass index is 34.9 kg/m. Physical Exam Vitals signs reviewed.  Constitutional:      Appearance: Normal appearance. She is obese.  Cardiovascular:     Rate and Rhythm: Normal rate.     Pulses: Normal pulses.  Pulmonary:     Effort: Pulmonary effort is normal.     Breath sounds: Normal breath sounds.  Musculoskeletal: Normal range of motion.  Skin:     General: Skin is warm and dry.  Neurological:     Mental Status: She is alert and oriented to person, place, and time.  Psychiatric:        Mood and Affect: Mood normal.        Behavior: Behavior normal.     RECENT LABS AND TESTS: BMET    Component Value Date/Time   NA 142 05/02/2019 0915   K 4.2 05/02/2019 0915   CL 105 05/02/2019 0915   CO2 25 05/02/2019 0915   GLUCOSE 98 05/02/2019 0915   GLUCOSE 117 (H) 11/02/2017 1100   BUN 20 05/02/2019 0915   CREATININE 0.70 05/02/2019 0915   CALCIUM 9.4 05/02/2019 0915   GFRNONAA 94 05/02/2019 0915   GFRAA 108 05/02/2019 0915   Lab Results  Component Value Date   HGBA1C 5.5 05/02/2019   HGBA1C 6.3 (H) 09/20/2018   Lab Results  Component Value Date   INSULIN 11.3 05/02/2019   INSULIN 17.2 09/20/2018   CBC    Component Value Date/Time   WBC 10.8 09/20/2018 1050   RBC 4.28 09/20/2018 1050   HGB 13.2 09/20/2018 1050   HCT 38.8 09/20/2018 1050   PLT 243 09/20/2018 1050   MCV 91 09/20/2018 1050   MCH 30.8 09/20/2018 1050   MCHC 34.0 09/20/2018 1050   RDW 12.4 09/20/2018 1050   LYMPHSABS 2.6 09/20/2018 1050   EOSABS 0.1 09/20/2018 1050   BASOSABS 0.0 09/20/2018 1050   Iron/TIBC/Ferritin/ %Sat No results found for: IRON, TIBC, FERRITIN, IRONPCTSAT Lipid Panel     Component Value Date/Time   CHOL 146 05/02/2019 0915   TRIG 124 05/02/2019 0915   HDL 40 05/02/2019 0915   CHOLHDL 3.6 09/20/2018 1050   LDLCALC 81 05/02/2019 0915   Hepatic Function Panel     Component Value Date/Time   PROT 6.7 05/02/2019 0915   ALBUMIN 4.7 05/02/2019 0915   AST 14 05/02/2019 0915   ALT 14 05/02/2019 0915   ALKPHOS 58 05/02/2019 0915   BILITOT 0.5 05/02/2019 0915      Component Value Date/Time   TSH 1.490  05/02/2019 0915   TSH 2.550 11/15/2018 1532   TSH 3.480 09/20/2018 1050      OBESITY BEHAVIORAL INTERVENTION VISIT  Today's visit was # 16   Starting weight: 219 lbs Starting date: 09/20/18 Today's weight : 197 lbs   Today's date: 06/27/2019 Total lbs lost to date: 21    ASK: We discussed the diagnosis of obesity with [name] today and Brinnley agreed to give [name] permission to discuss obesity behavioral modification therapy today.  ASSESS: Osie has the diagnosis of obesity and her BMI today is 34.91 Elideth is in the action stage of change   ADVISE: Legacie was educated on the multiple health risks of obesity as well as the benefit of weight loss to improve her health. She was advised of the need for long term treatment and the importance of lifestyle modifications to improve her current health and to decrease her risk of future health problems.  AGREE: Multiple dietary modification options and treatment options were discussed and  Keriann agreed to follow the recommendations documented in the above note.  ARRANGE: Terrisha was educated on the importance of frequent visits to treat obesity as outlined per CMS and USPSTF guidelines and agreed to schedule her next follow up appointment today.  Morgan Williams, am acting as transcriptionist for Abby Potash, PA-C I, Abby Potash, PA-C have reviewed above note and agree with its content

## 2019-07-09 MED FILL — SIMVASTATIN 40 MG TABLET: 40 | 90 days supply | Qty: 90 | Fill #2

## 2019-07-09 MED FILL — HYDROCHLOROTHIAZIDE 12.5 MG: 12.5 | 90 days supply | Qty: 90 | Fill #2

## 2019-07-09 MED FILL — LOSARTAN POTASSIUM 50 MG TA: 50 | 90 days supply | Qty: 90 | Fill #3

## 2019-07-16 ENCOUNTER — Ambulatory Visit (INDEPENDENT_AMBULATORY_CARE_PROVIDER_SITE_OTHER): Payer: 59 | Admitting: Physician Assistant

## 2019-07-16 ENCOUNTER — Encounter (INDEPENDENT_AMBULATORY_CARE_PROVIDER_SITE_OTHER): Payer: Self-pay | Admitting: Physician Assistant

## 2019-07-16 ENCOUNTER — Other Ambulatory Visit: Payer: Self-pay

## 2019-07-16 VITALS — BP 106/68 | HR 65 | Temp 98.5°F | Ht 63.0 in | Wt 197.0 lb

## 2019-07-16 DIAGNOSIS — R7303 Prediabetes: Secondary | ICD-10-CM | POA: Diagnosis not present

## 2019-07-16 DIAGNOSIS — Z6835 Body mass index (BMI) 35.0-35.9, adult: Secondary | ICD-10-CM

## 2019-07-17 MED FILL — FAMOTIDINE 20 MG TABS: 20 | 90 days supply | Qty: 180 | Fill #0

## 2019-07-17 NOTE — Progress Notes (Signed)
Office: [phone]  /  Fax: [phone]   HPI:   Chief Complaint: OBESITY Morgan Williams is here to discuss her progress with her obesity treatment plan. She is on the Category 2 plan and is following her eating plan approximately 25 % of the time. She states she is exercising 0 minutes 0 times per week. [name] reports that she was at the beach and she felt "out of control" with her eating. She is not meal planning dinner. Her weight is 197 lb (89.4 kg) today and she has maintained weight since her last visit. She has lost 22 lbs since starting treatment with [name].  Pre-Diabetes Morgan Williams has a diagnosis of prediabetes based on her elevated Hgb A1c and was informed this puts her at greater risk of developing diabetes. Morgan Williams is on metformin and she continues to work on diet and exercise to decrease risk of diabetes. She denies nausea, vomiting or diarrhea.  ASSESSMENT AND PLAN:  Prediabetes  Class 2 severe obesity with serious comorbidity and body mass index (BMI) of 35.0 to 35.9 in adult, unspecified obesity type [facility])  PLAN:  Pre-Diabetes Morgan Williams will continue to work on weight loss, exercise, and decreasing simple carbohydrates in her diet to help decrease the risk of diabetes. We dicussed metformin including benefits and risks. She was informed that eating too many simple carbohydrates or too many calories at one sitting increases the likelihood of GI side effects. Morgan Williams will continue metformin for now and a prescription was not written today. Morgan Williams agreed to follow up with [name] as directed to monitor her progress.  I spent > than 50% of the 25 minute visit on counseling as documented in the note.  Obesity Morgan Williams is currently in the action stage of change. As such, her goal is to continue with weight loss efforts She has agreed to keep a food journal with 1200 calories and 85 grams of protein daily Morgan Williams has been instructed to work up to a goal of 150 minutes of combined cardio and strengthening  exercise per week for weight loss and overall health benefits. We discussed the following Behavioral Modification Strategies today: keeping healthy foods in the home and work on meal planning and easy cooking plans  Morgan Williams has agreed to follow up with our clinic in 2 weeks. She was informed of the importance of frequent follow up visits to maximize her success with intensive lifestyle modifications for her multiple health conditions.  I spent > than 50% of the 25 minute visit on counseling as documented in the note.    ALLERGIES: Allergies  Allergen Reactions   Sulfa Antibiotics Rash    MEDICATIONS: Current Outpatient Medications on File Prior to Visit  Medication Sig Dispense Refill   acetaminophen (TYLENOL) 500 MG tablet Take 500 mg by mouth every 6 (six) hours as needed.     albuterol (VENTOLIN HFA) 108 (90 Base) MCG/ACT inhaler Inhale 1 puff into the lungs every 6 (six) hours as needed for wheezing or shortness of breath.     ALPRAZolam (XANAX) 0.5 MG tablet Take 0.5 mg by mouth at bedtime as needed for anxiety.     famotidine (PEPCID) 20 MG tablet Take 20 mg by mouth 2 (two) times daily.     hydrochlorothiazide (MICROZIDE) 12.5 MG capsule Take 12.5 mg by mouth daily.     levothyroxine (SYNTHROID) 100 MCG tablet Take 1 tablet (100 mcg total) by mouth daily. 15 tablet 0   levothyroxine (SYNTHROID) 88 MCG tablet Take 1 tablet (88 mcg total) by  mouth daily before breakfast. 15 tablet 0   losartan (COZAAR) 50 MG tablet Take 50 mg by mouth daily.     metFORMIN (GLUCOPHAGE) 500 MG tablet Take 1 tablet (500 mg total) by mouth 2 (two) times daily with a meal. 60 tablet 0   methocarbamol (ROBAXIN) 500 MG tablet methocarbamol 500 mg tablet  1 tablet po at bedtime as needed for muscle spasms     metroNIDAZOLE (METROCREAM) 0.75 % cream Apply 1 application topically 2 (two) times daily.     Multiple Vitamins-Minerals (MULTIVITAMIN WITH MINERALS) tablet Take 1 tablet by mouth daily.      Omega-3 Fatty Acids (FISH OIL) 1000 MG CPDR Take by mouth.     simvastatin (ZOCOR) 40 MG tablet Take 40 mg by mouth daily.     TURMERIC PO Take 450 mg by mouth daily.     VITAMIN D, ERGOCALCIFEROL, PO Take 5,000 mg by mouth every other day.      No current facility-administered medications on file prior to visit.     PAST MEDICAL HISTORY: Past Medical History:  Diagnosis Date   Allergy    Anxiety    Aortic atherosclerosis ([facility])    Arthritis    Asthma    Depression    Eczema    Fatty liver    GERD (gastroesophageal reflux disease)    Glaucoma    Graves disease    Heart murmur    Hyperlipidemia    Hypertension    Hypertensive retinopathy    Insomnia    Kidney problem    Obesity    Osteoarthritis    Osteopenia    Prediabetes    Rosacea    Thyroid disease    Vitamin D deficiency    Vocal cord nodules     PAST SURGICAL HISTORY: Past Surgical History:  Procedure Laterality Date   CESAREAN SECTION     x 1   MICROLARYNGOSCOPY Left 11/06/2017   Procedure: MICRO DIRECT LARYNGOSCOPY REMOVAL OF VOCAL CORD;  Surgeon: Jodi Marble, MD;  Location: [facility];  Service: ENT;  Laterality: Left;   THYROIDECTOMY, PARTIAL  1975   due to Grave's disease    SOCIAL HISTORY: Social History   Tobacco Use   Smoking status: Former Smoker    Years: 25.00    Types: Cigarettes    Quit date: 10/31/1998    Years since quitting: 20.7   Smokeless tobacco: Never Used  Substance Use Topics   Alcohol use: Yes    Alcohol/week: 2.0 standard drinks    Types: 2 Glasses of wine per week   Drug use: No    FAMILY HISTORY: Family History  Problem Relation Age of Onset   Hypertension Mother    Hyperlipidemia Mother    Diabetes Mother    Thyroid disease Mother    Hypertension Father    Heart disease Father        CHF   Colon cancer Father 20       stage 4   Kidney disease Father    Sleep apnea Father    Obesity Father     Thyroid disease Daughter    Esophageal cancer Neg Hx    Rectal cancer Neg Hx    Liver cancer Neg Hx    Pancreatic cancer Neg Hx    Stomach cancer Neg Hx     ROS: Review of Systems  Constitutional: Negative for weight loss.  Gastrointestinal: Negative for diarrhea, nausea and vomiting.    PHYSICAL EXAM: Blood pressure  106/68, pulse 65, temperature 98.5 F (36.9 C), temperature source Oral, height 5\' 3"  (1.6 m), weight 197 lb (89.4 kg), last menstrual period 11/01/2003, SpO2 96 %. Body mass index is 34.9 kg/m. Physical Exam Vitals signs reviewed.  Constitutional:      Appearance: Normal appearance. She is well-developed. She is obese.  Cardiovascular:     Rate and Rhythm: Normal rate.  Pulmonary:     Effort: Pulmonary effort is normal.  Musculoskeletal: Normal range of motion.  Skin:    General: Skin is warm and dry.  Neurological:     Mental Status: She is alert and oriented to person, place, and time.  Psychiatric:        Mood and Affect: Mood normal.        Behavior: Behavior normal.     RECENT LABS AND TESTS: BMET    Component Value Date/Time   NA 142 05/02/2019 0915   K 4.2 05/02/2019 0915   CL 105 05/02/2019 0915   CO2 25 05/02/2019 0915   GLUCOSE 98 05/02/2019 0915   GLUCOSE 117 (H) 11/02/2017 1100   BUN 20 05/02/2019 0915   CREATININE 0.70 05/02/2019 0915   CALCIUM 9.4 05/02/2019 0915   GFRNONAA 94 05/02/2019 0915   GFRAA 108 05/02/2019 0915   Lab Results  Component Value Date   HGBA1C 5.5 05/02/2019   HGBA1C 6.3 (H) 09/20/2018   Lab Results  Component Value Date   INSULIN 11.3 05/02/2019   INSULIN 17.2 09/20/2018   CBC    Component Value Date/Time   WBC 10.8 09/20/2018 1050   RBC 4.28 09/20/2018 1050   HGB 13.2 09/20/2018 1050   HCT 38.8 09/20/2018 1050   PLT 243 09/20/2018 1050   MCV 91 09/20/2018 1050   MCH 30.8 09/20/2018 1050   MCHC 34.0 09/20/2018 1050   RDW 12.4 09/20/2018 1050   LYMPHSABS 2.6 09/20/2018 1050   EOSABS 0.1  09/20/2018 1050   BASOSABS 0.0 09/20/2018 1050   Iron/TIBC/Ferritin/ %Sat No results found for: IRON, TIBC, FERRITIN, IRONPCTSAT Lipid Panel     Component Value Date/Time   CHOL 146 05/02/2019 0915   TRIG 124 05/02/2019 0915   HDL 40 05/02/2019 0915   CHOLHDL 3.6 09/20/2018 1050   LDLCALC 81 05/02/2019 0915   Hepatic Function Panel     Component Value Date/Time   PROT 6.7 05/02/2019 0915   ALBUMIN 4.7 05/02/2019 0915   AST 14 05/02/2019 0915   ALT 14 05/02/2019 0915   ALKPHOS 58 05/02/2019 0915   BILITOT 0.5 05/02/2019 0915      Component Value Date/Time   TSH 1.490 05/02/2019 0915   TSH 2.550 11/15/2018 1532   TSH 3.480 09/20/2018 1050     Ref. Range 05/02/2019 09:15  Vitamin D, 25-Hydroxy Latest Ref Range: 30.0 - 100.0 ng/mL 71.7    OBESITY BEHAVIORAL INTERVENTION VISIT  Today's visit was # 17   Starting weight: 219 lbs Starting date: 09/20/2018 Today's weight : 197 lbs Today's date: 07/16/2019 Total lbs lost to date: 22    07/16/2019  Height 5\' 3"  (1.6 m)  Weight 197 lb (89.4 kg)  BMI (Calculated) 34.91  BLOOD PRESSURE - SYSTOLIC [id number]  BLOOD PRESSURE - DIASTOLIC 68   Body Fat % 123XX123 %  Total Body Water (lbs) 74.2 lbs    ASK: We discussed the diagnosis of obesity with [name] today and Alysha agreed to give [name] permission to discuss obesity behavioral modification therapy today.  ASSESS: Angie has the diagnosis of  obesity and her BMI today is 34.91 Shaylia is in the action stage of change   ADVISE: Cayenne was educated on the multiple health risks of obesity as well as the benefit of weight loss to improve her health. She was advised of the need for long term treatment and the importance of lifestyle modifications to improve her current health and to decrease her risk of future health problems.  AGREE: Multiple dietary modification options and treatment options were discussed and  Novie agreed to follow the recommendations documented in the above  note.  ARRANGE: Maleny was educated on the importance of frequent visits to treat obesity as outlined per CMS and USPSTF guidelines and agreed to schedule her next follow up appointment today.  Corey Skains, am acting as transcriptionist for Abby Potash, PA-C I, Abby Potash, PA-C have reviewed above note and agree with its content

## 2019-07-22 MED FILL — metroNIDAZOLE 0.75 % LOTN: 0.75 | 90 days supply | Qty: 177 | Fill #0

## 2019-07-30 ENCOUNTER — Ambulatory Visit (INDEPENDENT_AMBULATORY_CARE_PROVIDER_SITE_OTHER): Payer: 59 | Admitting: Physician Assistant

## 2019-07-30 ENCOUNTER — Other Ambulatory Visit: Payer: Self-pay

## 2019-07-30 VITALS — BP 101/70 | HR 68 | Temp 98.5°F | Ht 63.0 in | Wt 196.0 lb

## 2019-07-30 DIAGNOSIS — E559 Vitamin D deficiency, unspecified: Secondary | ICD-10-CM | POA: Diagnosis not present

## 2019-07-30 DIAGNOSIS — Z6834 Body mass index (BMI) 34.0-34.9, adult: Secondary | ICD-10-CM | POA: Diagnosis not present

## 2019-07-30 DIAGNOSIS — E669 Obesity, unspecified: Secondary | ICD-10-CM | POA: Diagnosis not present

## 2019-07-30 NOTE — Progress Notes (Signed)
Office: [phone]  /  Fax: [phone]   HPI:   Chief Complaint: OBESITY Nykeria is here to discuss her progress with her obesity treatment plan. She is on the Category 2 plan and is following her eating plan approximately 50% of the time. She states she is exercising 0 minutes 0 times per week. Indiyah reports that she has done well going back on Category 2, but then got off track this weekend.  Her weight is 196 lb (88.9 kg) today and has had a weight loss of 1 pound over a period of 2 weeks since her last visit. She has lost 23 lbs since starting treatment with [name].  Vitamin D deficiency Lynnaya has a diagnosis of Vitamin D deficiency. She is currently taking OTC Vit D and denies nausea, vomiting or muscle weakness.  ASSESSMENT AND PLAN:  Vitamin D deficiency  Class 1 obesity with serious comorbidity and body mass index (BMI) of 34.0 to 34.9 in adult, unspecified obesity type  PLAN:  Vitamin D Deficiency Brigette was informed that low Vitamin D levels contributes to fatigue and are associated with obesity, breast, and colon cancer. She agrees to continue taking OTC Vit D and will follow-up for routine testing of Vitamin D, at least 2-3 times per year. She was informed of the risk of over-replacement of Vitamin D and agrees to not increase her dose unless she discusses this with [name] first. Yanin agrees to follow-up with our clinic in 2 weeks.  I spent > than 50% of the 25 minute visit on counseling as documented in the note.  Obesity Kynedi is currently in the action stage of change. As such, her goal is to continue with weight loss efforts She has agreed to follow the Category 2 plan Zyanna has been instructed to work up to a goal of 150 minutes of combined cardio and strengthening exercise per week for weight loss and overall health benefits. We discussed the following Behavioral Modification Stratagies today: work on meal planning and easy cooking plans, and keeping healthy foods in  the home.  Willadene has agreed to follow-up with our clinic in 2 weeks. She was informed of the importance of frequent follow-up visits to maximize her success with intensive lifestyle modifications for her multiple health conditions.  I spent > than 50% of the 25 minute visit on counseling as documented in the note.    ALLERGIES: Allergies  Allergen Reactions   Sulfa Antibiotics Rash    MEDICATIONS: Current Outpatient Medications on File Prior to Visit  Medication Sig Dispense Refill   acetaminophen (TYLENOL) 500 MG tablet Take 500 mg by mouth every 6 (six) hours as needed.     albuterol (VENTOLIN HFA) 108 (90 Base) MCG/ACT inhaler Inhale 1 puff into the lungs every 6 (six) hours as needed for wheezing or shortness of breath.     ALPRAZolam (XANAX) 0.5 MG tablet Take 0.5 mg by mouth at bedtime as needed for anxiety.     famotidine (PEPCID) 20 MG tablet Take 20 mg by mouth 2 (two) times daily.     hydrochlorothiazide (MICROZIDE) 12.5 MG capsule Take 12.5 mg by mouth daily.     levothyroxine (SYNTHROID) 100 MCG tablet Take 1 tablet (100 mcg total) by mouth daily. 15 tablet 0   levothyroxine (SYNTHROID) 88 MCG tablet Take 1 tablet (88 mcg total) by mouth daily before breakfast. 15 tablet 0   losartan (COZAAR) 50 MG tablet Take 50 mg by mouth daily.     metFORMIN (GLUCOPHAGE) 500  MG tablet Take 1 tablet (500 mg total) by mouth 2 (two) times daily with a meal. 60 tablet 0   methocarbamol (ROBAXIN) 500 MG tablet methocarbamol 500 mg tablet  1 tablet po at bedtime as needed for muscle spasms     metroNIDAZOLE (METROCREAM) 0.75 % cream Apply 1 application topically 2 (two) times daily.     Multiple Vitamins-Minerals (MULTIVITAMIN WITH MINERALS) tablet Take 1 tablet by mouth daily.     Omega-3 Fatty Acids (FISH OIL) 1000 MG CPDR Take by mouth.     simvastatin (ZOCOR) 40 MG tablet Take 40 mg by mouth daily.     TURMERIC PO Take 450 mg by mouth daily.     VITAMIN D,  ERGOCALCIFEROL, PO Take 5,000 mg by mouth every other day.      No current facility-administered medications on file prior to visit.     PAST MEDICAL HISTORY: Past Medical History:  Diagnosis Date   Allergy    Anxiety    Aortic atherosclerosis (HCC)    Arthritis    Asthma    Depression    Eczema    Fatty liver    GERD (gastroesophageal reflux disease)    Glaucoma    Graves disease    Heart murmur    Hyperlipidemia    Hypertension    Hypertensive retinopathy    Insomnia    Kidney problem    Obesity    Osteoarthritis    Osteopenia    Prediabetes    Rosacea    Thyroid disease    Vitamin D deficiency    Vocal cord nodules     PAST SURGICAL HISTORY: Past Surgical History:  Procedure Laterality Date   CESAREAN SECTION     x 1   MICROLARYNGOSCOPY Left 11/06/2017   Procedure: MICRO DIRECT LARYNGOSCOPY REMOVAL OF VOCAL CORD;  Surgeon: Jodi Marble, MD;  Location: [name];  Service: ENT;  Laterality: Left;   THYROIDECTOMY, PARTIAL  1975   due to Grave's disease    SOCIAL HISTORY: Social History   Tobacco Use   Smoking status: Former Smoker    Years: 25.00    Types: Cigarettes    Quit date: 10/31/1998    Years since quitting: 20.7   Smokeless tobacco: Never Used  Substance Use Topics   Alcohol use: Yes    Alcohol/week: 2.0 standard drinks    Types: 2 Glasses of wine per week   Drug use: No    FAMILY HISTORY: Family History  Problem Relation Age of Onset   Hypertension Mother    Hyperlipidemia Mother    Diabetes Mother    Thyroid disease Mother    Hypertension Father    Heart disease Father        CHF   Colon cancer Father 26       stage 4   Kidney disease Father    Sleep apnea Father    Obesity Father    Thyroid disease Daughter    Esophageal cancer Neg Hx    Rectal cancer Neg Hx    Liver cancer Neg Hx    Pancreatic cancer Neg Hx    Stomach cancer Neg Hx    ROS: Review of Systems   Gastrointestinal: Negative for nausea and vomiting.  Musculoskeletal:       Negative for muscle weakness.   PHYSICAL EXAM: Blood pressure 101/70, pulse 68, temperature 98.5 F (36.9 C), temperature source Oral, height 5\' 3"  (1.6 m), weight 196 lb (88.9 kg), last menstrual period  11/01/2003, SpO2 98 %. Body mass index is 34.72 kg/m. Physical Exam Vitals signs reviewed.  Constitutional:      Appearance: Normal appearance. She is obese.  Cardiovascular:     Rate and Rhythm: Normal rate.     Pulses: Normal pulses.  Pulmonary:     Effort: Pulmonary effort is normal.     Breath sounds: Normal breath sounds.  Musculoskeletal: Normal range of motion.  Skin:    General: Skin is warm and dry.  Neurological:     Mental Status: She is alert and oriented to person, place, and time.  Psychiatric:        Behavior: Behavior normal.   RECENT LABS AND TESTS: BMET    Component Value Date/Time   NA 142 05/02/2019 0915   K 4.2 05/02/2019 0915   CL 105 05/02/2019 0915   CO2 25 05/02/2019 0915   GLUCOSE 98 05/02/2019 0915   GLUCOSE 117 (H) 11/02/2017 1100   BUN 20 05/02/2019 0915   CREATININE 0.70 05/02/2019 0915   CALCIUM 9.4 05/02/2019 0915   GFRNONAA 94 05/02/2019 0915   GFRAA 108 05/02/2019 0915   Lab Results  Component Value Date   HGBA1C 5.5 05/02/2019   HGBA1C 6.3 (H) 09/20/2018   Lab Results  Component Value Date   INSULIN 11.3 05/02/2019   INSULIN 17.2 09/20/2018   CBC    Component Value Date/Time   WBC 10.8 09/20/2018 1050   RBC 4.28 09/20/2018 1050   HGB 13.2 09/20/2018 1050   HCT 38.8 09/20/2018 1050   PLT 243 09/20/2018 1050   MCV 91 09/20/2018 1050   MCH 30.8 09/20/2018 1050   MCHC 34.0 09/20/2018 1050   RDW 12.4 09/20/2018 1050   LYMPHSABS 2.6 09/20/2018 1050   EOSABS 0.1 09/20/2018 1050   BASOSABS 0.0 09/20/2018 1050   Iron/TIBC/Ferritin/ %Sat No results found for: IRON, TIBC, FERRITIN, IRONPCTSAT Lipid Panel     Component Value Date/Time   CHOL  146 05/02/2019 0915   TRIG 124 05/02/2019 0915   HDL 40 05/02/2019 0915   CHOLHDL 3.6 09/20/2018 1050   LDLCALC 81 05/02/2019 0915   Hepatic Function Panel     Component Value Date/Time   PROT 6.7 05/02/2019 0915   ALBUMIN 4.7 05/02/2019 0915   AST 14 05/02/2019 0915   ALT 14 05/02/2019 0915   ALKPHOS 58 05/02/2019 0915   BILITOT 0.5 05/02/2019 0915      Component Value Date/Time   TSH 1.490 05/02/2019 0915   TSH 2.550 11/15/2018 1532   TSH 3.480 09/20/2018 1050   Results for KRISHA, FANTAUZZI (MRN [id number]) as of 07/30/2019 11:14  Ref. Range 05/02/2019 09:15  Vitamin D, 25-Hydroxy Latest Ref Range: 30.0 - 100.0 ng/mL 71.7   OBESITY BEHAVIORAL INTERVENTION VISIT  Today's visit was #18   Starting weight: 219 lbs Starting date: 09/20/2018 Today's weight: 196 lbs  Today's date: 07/30/2019 Total lbs lost to date: 23    07/30/2019  Height 5\' 3"  (1.6 m)  Weight 196 lb (88.9 kg)  BMI (Calculated) 34.73  BLOOD PRESSURE - SYSTOLIC [id number]  BLOOD PRESSURE - DIASTOLIC 70   Body Fat % 99991111 %  Total Body Water (lbs) 75 lbs   ASK: We discussed the diagnosis of obesity with [name] today and Lorese agreed to give [name] permission to discuss obesity behavioral modification therapy today.  ASSESS: Roz has the diagnosis of obesity and her BMI today is 34.8. Roselea is in the action stage of change.   ADVISE:  Bentley was educated on the multiple health risks of obesity as well as the benefit of weight loss to improve her health. She was advised of the need for long term treatment and the importance of lifestyle modifications to improve her current health and to decrease her risk of future health problems.  AGREE: Multiple dietary modification options and treatment options were discussed and  Yagmur agreed to follow the recommendations documented in the above note.  ARRANGE: Shiny was educated on the importance of frequent visits to treat obesity as outlined per CMS and USPSTF  guidelines and agreed to schedule her next follow up appointment today.  Migdalia Dk, am acting as transcriptionist for Abby Potash, PA-C I, Abby Potash, PA-C have reviewed above note and agree with its content

## 2019-08-13 ENCOUNTER — Ambulatory Visit (INDEPENDENT_AMBULATORY_CARE_PROVIDER_SITE_OTHER): Payer: 59 | Admitting: Physician Assistant

## 2019-08-13 ENCOUNTER — Encounter (INDEPENDENT_AMBULATORY_CARE_PROVIDER_SITE_OTHER): Payer: Self-pay | Admitting: Physician Assistant

## 2019-08-13 ENCOUNTER — Other Ambulatory Visit: Payer: Self-pay

## 2019-08-13 VITALS — BP 124/74 | HR 66 | Temp 98.2°F | Ht 63.0 in | Wt 196.0 lb

## 2019-08-13 DIAGNOSIS — Z9189 Other specified personal risk factors, not elsewhere classified: Secondary | ICD-10-CM

## 2019-08-13 DIAGNOSIS — Z6834 Body mass index (BMI) 34.0-34.9, adult: Secondary | ICD-10-CM | POA: Diagnosis not present

## 2019-08-13 DIAGNOSIS — E669 Obesity, unspecified: Secondary | ICD-10-CM | POA: Diagnosis not present

## 2019-08-13 DIAGNOSIS — E038 Other specified hypothyroidism: Secondary | ICD-10-CM

## 2019-08-13 DIAGNOSIS — R7303 Prediabetes: Secondary | ICD-10-CM | POA: Diagnosis not present

## 2019-08-13 MED ORDER — LEVOTHYROXINE SODIUM 100 MCG PO TABS: 100.0000 ug | ORAL_TABLET | Freq: Every day | ORAL | 1 refills | Status: DC

## 2019-08-13 MED ORDER — LEVOTHYROXINE SODIUM 88 MCG PO TABS: 88.0000 ug | ORAL_TABLET | Freq: Every day | ORAL | 1 refills | Status: DC

## 2019-08-13 MED ORDER — METFORMIN HCL 500 MG PO TABS: 500.0000 mg | ORAL_TABLET | Freq: Every day | ORAL | 0 refills | Status: DC

## 2019-08-13 MED FILL — metFORMIN HCL 500 MG TABS: 500 | 30 days supply | Qty: 30 | Fill #0

## 2019-08-13 MED FILL — LEVOTHYROXINE 100 MCG TABLE: 100 | 30 days supply | Qty: 30 | Fill #0

## 2019-08-13 MED FILL — LEVOTHYROXINE 88 MCG TABLET: 88 | 30 days supply | Qty: 30 | Fill #0

## 2019-08-15 NOTE — Progress Notes (Signed)
Office: [phone]  /  Fax: [phone]   HPI:   Chief Complaint: OBESITY Morgan Williams is here to discuss her progress with her obesity treatment plan. She is on the Category 2 plan and is following her eating plan approximately 60 to 65 % of the time. She states she is exercising 0 minutes 0 times per week. Maitland reports that she is struggling with excessive snacking. She is not weighing her meat at night. Her weight is 196 lb (88.9 kg) today and she has maintained weight since her last visit. She has lost 23 lbs since starting treatment with [name].  Pre-Diabetes Morgan Williams has a diagnosis of prediabetes based on her elevated Hgb A1c and was informed this puts her at greater risk of developing diabetes. Morgan Williams is taking metformin currently and she denies nausea, vomiting or diarrhea. She continues to work on diet and exercise to decrease risk of diabetes. She denies polyphagia.  At risk for diabetes Morgan Williams is at higher than average risk for developing diabetes due to her obesity and prediabetes. She currently denies polyuria or polydipsia.  Hypothyroidism Morgan Williams has a diagnosis of hypothyroidism. She is on levothyroxine. She denies hot or cold intolerance.  ASSESSMENT AND PLAN:  Prediabetes - Plan: metFORMIN (GLUCOPHAGE) 500 MG tablet  Other specified hypothyroidism - Plan: levothyroxine (SYNTHROID) 100 MCG tablet, levothyroxine (SYNTHROID) 88 MCG tablet  At risk for diabetes mellitus  Class 1 obesity with serious comorbidity and body mass index (BMI) of 34.0 to 34.9 in adult, unspecified obesity type  PLAN:  Pre-Diabetes Morgan Williams will continue to work on weight loss, exercise, and decreasing simple carbohydrates in her diet to help decrease the risk of diabetes. We dicussed metformin including benefits and risks. She was informed that eating too many simple carbohydrates or too many calories at one sitting increases the likelihood of GI side effects. Morgan Williams agrees to continue metformin 500 mg #30  with no refills and change to daily with dinner and follow up with [name] as directed to monitor her progress.   Diabetes risk counseling Morgan Williams was given extended (15 minutes) diabetes prevention counseling today. She is 63 y.o. female and has risk factors for diabetes including obesity and prediabetes. We discussed intensive lifestyle modifications today with an emphasis on weight loss as well as increasing exercise and decreasing simple carbohydrates in her diet.  Hypothyroidism Morgan Williams was informed of the importance of good thyroid control to help with weight loss efforts. She was also informed that supertherapeutic thyroid levels are dangerous and will not improve weight loss results. Morgan Williams agrees to continue levothyroxine (both doses) #15 each with 1 refill and follow up as directed.  Obesity Morgan Williams is currently in the action stage of change. As such, her goal is to continue with weight loss efforts She has agreed to follow the Category 2 plan Morgan Williams has been instructed to work up to a goal of 150 minutes of combined cardio and strengthening exercise per week for weight loss and overall health benefits. We discussed the following Behavioral Modification Strategies today: keeping healthy foods in the home and work on meal planning and easy cooking plans  Morgan Williams has agreed to follow up with our clinic in 2 weeks. She was informed of the importance of frequent follow up visits to maximize her success with intensive lifestyle modifications for her multiple health conditions.  ALLERGIES: Allergies  Allergen Reactions   Sulfa Antibiotics Rash    MEDICATIONS: Current Outpatient Medications on File Prior to Visit  Medication Sig Dispense Refill  acetaminophen (TYLENOL) 500 MG tablet Take 500 mg by mouth every 6 (six) hours as needed.     albuterol (VENTOLIN HFA) 108 (90 Base) MCG/ACT inhaler Inhale 1 puff into the lungs every 6 (six) hours as needed for wheezing or shortness of breath.      ALPRAZolam (XANAX) 0.5 MG tablet Take 0.5 mg by mouth at bedtime as needed for anxiety.     famotidine (PEPCID) 20 MG tablet Take 20 mg by mouth 2 (two) times daily.     hydrochlorothiazide (MICROZIDE) 12.5 MG capsule Take 12.5 mg by mouth daily.     losartan (COZAAR) 50 MG tablet Take 50 mg by mouth daily.     methocarbamol (ROBAXIN) 500 MG tablet methocarbamol 500 mg tablet  1 tablet po at bedtime as needed for muscle spasms     metroNIDAZOLE (METROCREAM) 0.75 % cream Apply 1 application topically 2 (two) times daily.     Multiple Vitamins-Minerals (MULTIVITAMIN WITH MINERALS) tablet Take 1 tablet by mouth daily.     Omega-3 Fatty Acids (FISH OIL) 1000 MG CPDR Take by mouth.     simvastatin (ZOCOR) 40 MG tablet Take 40 mg by mouth daily.     TURMERIC PO Take 450 mg by mouth daily.     VITAMIN D, ERGOCALCIFEROL, PO Take 5,000 mg by mouth every other day.      No current facility-administered medications on file prior to visit.     PAST MEDICAL HISTORY: Past Medical History:  Diagnosis Date   Allergy    Anxiety    Aortic atherosclerosis (HCC)    Arthritis    Asthma    Depression    Eczema    Fatty liver    GERD (gastroesophageal reflux disease)    Glaucoma    Graves disease    Heart murmur    Hyperlipidemia    Hypertension    Hypertensive retinopathy    Insomnia    Kidney problem    Obesity    Osteoarthritis    Osteopenia    Prediabetes    Rosacea    Thyroid disease    Vitamin D deficiency    Vocal cord nodules     PAST SURGICAL HISTORY: Past Surgical History:  Procedure Laterality Date   CESAREAN SECTION     x 1   MICROLARYNGOSCOPY Left 11/06/2017   Procedure: MICRO DIRECT LARYNGOSCOPY REMOVAL OF VOCAL CORD;  Surgeon: Jodi Marble, MD;  Location: [facility];  Service: ENT;  Laterality: Left;   THYROIDECTOMY, PARTIAL  1975   due to Grave's disease    SOCIAL HISTORY: Social History   Tobacco Use    Smoking status: Former Smoker    Years: 25.00    Types: Cigarettes    Quit date: 10/31/1998    Years since quitting: 20.8   Smokeless tobacco: Never Used  Substance Use Topics   Alcohol use: Yes    Alcohol/week: 2.0 standard drinks    Types: 2 Glasses of wine per week   Drug use: No    FAMILY HISTORY: Family History  Problem Relation Age of Onset   Hypertension Mother    Hyperlipidemia Mother    Diabetes Mother    Thyroid disease Mother    Hypertension Father    Heart disease Father        CHF   Colon cancer Father 59       stage 4   Kidney disease Father    Sleep apnea Father    Obesity Father  Thyroid disease Daughter    Esophageal cancer Neg Hx    Rectal cancer Neg Hx    Liver cancer Neg Hx    Pancreatic cancer Neg Hx    Stomach cancer Neg Hx     ROS: Review of Systems  Constitutional: Negative for weight loss.  Gastrointestinal: Negative for diarrhea, nausea and vomiting.  Genitourinary: Negative for frequency.  Endo/Heme/Allergies: Negative for polydipsia.       Negative for polyphagia Negative for heat or cold intolerance    PHYSICAL EXAM: Blood pressure 124/74, pulse 66, temperature 98.2 F (36.8 C), temperature source Oral, height 5\' 3"  (1.6 m), weight 196 lb (88.9 kg), last menstrual period 11/01/2003, SpO2 97 %. Body mass index is 34.72 kg/m. Physical Exam Vitals signs reviewed.  Constitutional:      Appearance: Normal appearance. She is well-developed. She is obese.  Cardiovascular:     Rate and Rhythm: Normal rate.  Pulmonary:     Effort: Pulmonary effort is normal.  Musculoskeletal: Normal range of motion.  Skin:    General: Skin is warm and dry.  Neurological:     Mental Status: She is alert and oriented to person, place, and time.  Psychiatric:        Mood and Affect: Mood normal.        Behavior: Behavior normal.     RECENT LABS AND TESTS: BMET    Component Value Date/Time   NA 142 05/02/2019 0915   K 4.2  05/02/2019 0915   CL 105 05/02/2019 0915   CO2 25 05/02/2019 0915   GLUCOSE 98 05/02/2019 0915   GLUCOSE 117 (H) 11/02/2017 1100   BUN 20 05/02/2019 0915   CREATININE 0.70 05/02/2019 0915   CALCIUM 9.4 05/02/2019 0915   GFRNONAA 94 05/02/2019 0915   GFRAA 108 05/02/2019 0915   Lab Results  Component Value Date   HGBA1C 5.5 05/02/2019   HGBA1C 6.3 (H) 09/20/2018   Lab Results  Component Value Date   INSULIN 11.3 05/02/2019   INSULIN 17.2 09/20/2018   CBC    Component Value Date/Time   WBC 10.8 09/20/2018 1050   RBC 4.28 09/20/2018 1050   HGB 13.2 09/20/2018 1050   HCT 38.8 09/20/2018 1050   PLT 243 09/20/2018 1050   MCV 91 09/20/2018 1050   MCH 30.8 09/20/2018 1050   MCHC 34.0 09/20/2018 1050   RDW 12.4 09/20/2018 1050   LYMPHSABS 2.6 09/20/2018 1050   EOSABS 0.1 09/20/2018 1050   BASOSABS 0.0 09/20/2018 1050   Iron/TIBC/Ferritin/ %Sat No results found for: IRON, TIBC, FERRITIN, IRONPCTSAT Lipid Panel     Component Value Date/Time   CHOL 146 05/02/2019 0915   TRIG 124 05/02/2019 0915   HDL 40 05/02/2019 0915   CHOLHDL 3.6 09/20/2018 1050   LDLCALC 81 05/02/2019 0915   Hepatic Function Panel     Component Value Date/Time   PROT 6.7 05/02/2019 0915   ALBUMIN 4.7 05/02/2019 0915   AST 14 05/02/2019 0915   ALT 14 05/02/2019 0915   ALKPHOS 58 05/02/2019 0915   BILITOT 0.5 05/02/2019 0915      Component Value Date/Time   TSH 1.490 05/02/2019 0915   TSH 2.550 11/15/2018 1532   TSH 3.480 09/20/2018 1050     Ref. Range 05/02/2019 09:15  Vitamin D, 25-Hydroxy Latest Ref Range: 30.0 - 100.0 ng/mL 71.7    OBESITY BEHAVIORAL INTERVENTION VISIT  Today's visit was # 19   Starting weight: 219 lbs Starting date: 09/20/2018 Today's weight : 196  lbs Today's date: 08/13/2019 Total lbs lost to date: 23    08/13/2019  Height 5\' 3"  (1.6 m)  Weight 196 lb (88.9 kg)  BMI (Calculated) 34.73  BLOOD PRESSURE - SYSTOLIC [id number]  BLOOD PRESSURE - DIASTOLIC 74   Body  Fat % 48.1 %  Total Body Water (lbs) 74 lbs    ASK: We discussed the diagnosis of obesity with Morgan Williams today and Morgan Williams agreed to give [name] permission to discuss obesity behavioral modification therapy today.  ASSESS: Morgan Williams has the diagnosis of obesity and her BMI today is 34.73 Morgan Williams is in the action stage of change   ADVISE: Morgan Williams was educated on the multiple health risks of obesity as well as the benefit of weight loss to improve her health. She was advised of the need for long term treatment and the importance of lifestyle modifications to improve her current health and to decrease her risk of future health problems.  AGREE: Multiple dietary modification options and treatment options were discussed and  Morgan Williams agreed to follow the recommendations documented in the above note.  ARRANGE: Morgan Williams was educated on the importance of frequent visits to treat obesity as outlined per CMS and USPSTF guidelines and agreed to schedule her next follow up appointment today.  Corey Skains, am acting as transcriptionist for Abby Potash, PA-C I, Abby Potash, PA-C have reviewed above note and agree with its content

## 2019-09-05 ENCOUNTER — Other Ambulatory Visit: Payer: Self-pay

## 2019-09-05 ENCOUNTER — Ambulatory Visit (INDEPENDENT_AMBULATORY_CARE_PROVIDER_SITE_OTHER): Payer: 59 | Admitting: Physician Assistant

## 2019-09-05 ENCOUNTER — Encounter (INDEPENDENT_AMBULATORY_CARE_PROVIDER_SITE_OTHER): Payer: Self-pay | Admitting: Physician Assistant

## 2019-09-05 VITALS — BP 119/71 | HR 69 | Temp 98.1°F | Ht 63.0 in | Wt 195.0 lb

## 2019-09-05 DIAGNOSIS — Z9189 Other specified personal risk factors, not elsewhere classified: Secondary | ICD-10-CM

## 2019-09-05 DIAGNOSIS — E559 Vitamin D deficiency, unspecified: Secondary | ICD-10-CM | POA: Diagnosis not present

## 2019-09-05 DIAGNOSIS — E7849 Other hyperlipidemia: Secondary | ICD-10-CM

## 2019-09-05 DIAGNOSIS — Z6834 Body mass index (BMI) 34.0-34.9, adult: Secondary | ICD-10-CM | POA: Diagnosis not present

## 2019-09-05 DIAGNOSIS — R7303 Prediabetes: Secondary | ICD-10-CM | POA: Diagnosis not present

## 2019-09-05 DIAGNOSIS — E669 Obesity, unspecified: Secondary | ICD-10-CM | POA: Diagnosis not present

## 2019-09-06 LAB — LIPID PANEL WITH LDL/HDL RATIO
Cholesterol, Total: 144 mg/dL (ref 100–199)
HDL: 41 mg/dL (ref >39)
LDL Chol Calc (NIH): 81 mg/dL (ref 0–99)
LDL/HDL Ratio: 2 ratio (ref 0.0–3.2)
Triglycerides: 121 mg/dL (ref 0–149)
VLDL Cholesterol Cal: 22 mg/dL (ref 5–40)

## 2019-09-06 LAB — VITAMIN D 25 HYDROXY (VIT D DEFICIENCY, FRACTURES): Vit D, 25-Hydroxy: 46 ng/mL (ref 30.0–100.0)

## 2019-09-06 LAB — COMPREHENSIVE METABOLIC PANEL
ALT: 10 IU/L (ref 0–32)
AST: 14 IU/L (ref 0–40)
Albumin/Globulin Ratio: 2.5 — ABNORMAL HIGH (ref 1.2–2.2)
Albumin: 4.7 g/dL (ref 3.8–4.8)
Alkaline Phosphatase: 62 IU/L (ref 39–117)
BUN/Creatinine Ratio: 28 (ref 12–28)
BUN: 22 mg/dL (ref 8–27)
Bilirubin Total: 0.6 mg/dL (ref 0.0–1.2)
CO2: 25 mmol/L (ref 20–29)
Calcium: 9.3 mg/dL (ref 8.7–10.3)
Chloride: 103 mmol/L (ref 96–106)
Creatinine, Ser: 0.78 mg/dL (ref 0.57–1.00)
GFR calc Af Amer: 95 mL/min/{1.73_m2} (ref >59)
GFR calc non Af Amer: 82 mL/min/{1.73_m2} (ref >59)
Globulin, Total: 1.9 g/dL (ref 1.5–4.5)
Glucose: 93 mg/dL (ref 65–99)
Potassium: 3.9 mmol/L (ref 3.5–5.2)
Sodium: 140 mmol/L (ref 134–144)
Total Protein: 6.6 g/dL (ref 6.0–8.5)

## 2019-09-06 LAB — INSULIN, RANDOM: INSULIN: 8.3 u[IU]/mL (ref 2.6–24.9)

## 2019-09-06 LAB — HEMOGLOBIN A1C
Est. average glucose Bld gHb Est-mCnc: 114 mg/dL
Hgb A1c MFr Bld: 5.6 % (ref 4.8–5.6)

## 2019-09-09 NOTE — Progress Notes (Signed)
Office: [phone]  /  Fax: [phone]   HPI:   Chief Complaint: OBESITY Morgan Williams is here to discuss her progress with her obesity treatment plan. She is on the Category 2 plan and is following her eating plan approximately 70 % of the time. She states she is exercising 0 minutes 0 times per week. Morgan Williams reports that she feels like "quitting" because she is not following the plan as closely as she should be. Eating out and social events pose a challenge for her. Her weight is 195 lb (88.5 kg) today and has had a weight loss of 1 pound over a period of 3 weeks since her last visit. She has lost 24 lbs since starting treatment with [name].  Hyperlipidemia Morgan Williams has hyperlipidemia and she is on Zocor. She has been trying to improve her cholesterol levels with intensive lifestyle modification including a low saturated fat diet, exercise and weight loss. She denies any chest pain.  Vitamin D deficiency Morgan Williams has a diagnosis of vitamin D deficiency. Morgan Williams is currently taking OTC vit D and she denies nausea, vomiting or muscle weakness.  At risk for osteopenia and osteoporosis Morgan Williams is at higher risk of osteopenia and osteoporosis due to vitamin D deficiency.   Pre-Diabetes Morgan Williams has a diagnosis of prediabetes based on her elevated Hgb A1c and was informed this puts her at greater risk of developing diabetes. Morgan Williams is on metformin and she denies nausea, vomiting or diarrhea. Her last A1c was at 5.5. She continues to work on diet and exercise to decrease risk of diabetes.   ASSESSMENT AND PLAN:  Other hyperlipidemia - Plan: Lipid Panel With LDL/HDL Ratio  Vitamin D deficiency - Plan: Vitamin D (25 hydroxy)  Prediabetes - Plan: Comprehensive Metabolic Panel (CMET), HgB A1c, Insulin, random  At risk for osteoporosis  Class 1 obesity with serious comorbidity and body mass index (BMI) of 34.0 to 34.9 in adult, unspecified obesity type  PLAN:  Hyperlipidemia Morgan Williams was informed of the American  Heart Association Guidelines emphasizing intensive lifestyle modifications as the first line treatment for hyperlipidemia. We discussed many lifestyle modifications today in depth, and Morgan Williams will continue to work on decreasing saturated fats such as fatty red meat, butter and many fried foods. She will also increase vegetables and lean protein in her diet and continue to work on exercise and weight loss efforts. Morgan Williams will continue Zocor and follow up as directed.  Vitamin D Deficiency [name] was informed that low vitamin D levels contributes to fatigue and are associated with obesity, breast, and colon cancer. Morgan Williams will continue to take OTC Vit D @5 ,000 IU every other day and she will follow up for routine testing of vitamin D, at least 2-3 times per year. She was informed of the risk of over-replacement of vitamin D and agrees to not increase her dose unless she discusses this with [name] first.  At risk for osteopenia and osteoporosis Morgan Williams was given extended  (15 minutes) osteoporosis prevention counseling today. Morgan Williams is at risk for osteopenia and osteoporosis due to her vitamin D deficiency. She was encouraged to take her vitamin D and follow her higher calcium diet and increase strengthening exercise to help strengthen her bones and decrease her risk of osteopenia and osteoporosis.  Pre-Diabetes Morgan Williams will continue to work on weight loss, exercise, and decreasing simple carbohydrates in her diet to help decrease the risk of diabetes. She was informed that eating too many simple carbohydrates or too many calories at one sitting increases the likelihood  of GI side effects. Morgan Williams will continue metformin and follow up with [name] as directed to monitor her progress.  Obesity Morgan Williams is currently in the action stage of change. As such, her goal is to continue with weight loss efforts She has agreed to follow the Category 2 plan Morgan Williams has been instructed to work up to a goal of 150 minutes of combined cardio  and strengthening exercise per week for weight loss and overall health benefits. We discussed the following Behavioral Modification Strategies today: keeping healthy foods in the home and work on meal planning and easy cooking plans  Morgan Williams has agreed to follow up with our clinic in 2 to 3 weeks. She was informed of the importance of frequent follow up visits to maximize her success with intensive lifestyle modifications for her multiple health conditions.  ALLERGIES: Allergies  Allergen Reactions   Sulfa Antibiotics Rash    MEDICATIONS: Current Outpatient Medications on File Prior to Visit  Medication Sig Dispense Refill   acetaminophen (TYLENOL) 500 MG tablet Take 500 mg by mouth every 6 (six) hours as needed.     albuterol (VENTOLIN HFA) 108 (90 Base) MCG/ACT inhaler Inhale 1 puff into the lungs every 6 (six) hours as needed for wheezing or shortness of breath.     ALPRAZolam (XANAX) 0.5 MG tablet Take 0.5 mg by mouth at bedtime as needed for anxiety.     famotidine (PEPCID) 20 MG tablet Take 20 mg by mouth 2 (two) times daily.     hydrochlorothiazide (MICROZIDE) 12.5 MG capsule Take 12.5 mg by mouth daily.     levothyroxine (SYNTHROID) 100 MCG tablet Take 1 tablet (100 mcg total) by mouth daily. 15 tablet 1   levothyroxine (SYNTHROID) 88 MCG tablet Take 1 tablet (88 mcg total) by mouth daily before breakfast. 15 tablet 1   losartan (COZAAR) 50 MG tablet Take 50 mg by mouth daily.     metFORMIN (GLUCOPHAGE) 500 MG tablet Take 1 tablet (500 mg total) by mouth daily with supper. 30 tablet 0   methocarbamol (ROBAXIN) 500 MG tablet methocarbamol 500 mg tablet  1 tablet po at bedtime as needed for muscle spasms     metroNIDAZOLE (METROCREAM) 0.75 % cream Apply 1 application topically 2 (two) times daily.     Multiple Vitamins-Minerals (MULTIVITAMIN WITH MINERALS) tablet Take 1 tablet by mouth daily.     Omega-3 Fatty Acids (FISH OIL) 1000 MG CPDR Take by mouth.     simvastatin  (ZOCOR) 40 MG tablet Take 40 mg by mouth daily.     TURMERIC PO Take 450 mg by mouth daily.     VITAMIN D, ERGOCALCIFEROL, PO Take 5,000 mg by mouth every other day.      No current facility-administered medications on file prior to visit.     PAST MEDICAL HISTORY: Past Medical History:  Diagnosis Date   Allergy    Anxiety    Aortic atherosclerosis (HCC)    Arthritis    Asthma    Depression    Eczema    Fatty liver    GERD (gastroesophageal reflux disease)    Glaucoma    Graves disease    Heart murmur    Hyperlipidemia    Hypertension    Hypertensive retinopathy    Insomnia    Kidney problem    Obesity    Osteoarthritis    Osteopenia    Prediabetes    Rosacea    Thyroid disease    Vitamin D deficiency  Vocal cord nodules     PAST SURGICAL HISTORY: Past Surgical History:  Procedure Laterality Date   CESAREAN SECTION     x 1   MICROLARYNGOSCOPY Left 11/06/2017   Procedure: MICRO DIRECT LARYNGOSCOPY REMOVAL OF VOCAL CORD;  Surgeon: Jodi Marble, MD;  Location: [facility];  Service: ENT;  Laterality: Left;   THYROIDECTOMY, PARTIAL  1975   due to Grave's disease    SOCIAL HISTORY: Social History   Tobacco Use   Smoking status: Former Smoker    Years: 25.00    Types: Cigarettes    Quit date: 10/31/1998    Years since quitting: 20.8   Smokeless tobacco: Never Used  Substance Use Topics   Alcohol use: Yes    Alcohol/week: 2.0 standard drinks    Types: 2 Glasses of wine per week   Drug use: No    FAMILY HISTORY: Family History  Problem Relation Age of Onset   Hypertension Mother    Hyperlipidemia Mother    Diabetes Mother    Thyroid disease Mother    Hypertension Father    Heart disease Father        CHF   Colon cancer Father 14       stage 4   Kidney disease Father    Sleep apnea Father    Obesity Father    Thyroid disease Daughter    Esophageal cancer Neg Hx    Rectal cancer Neg Hx     Liver cancer Neg Hx    Pancreatic cancer Neg Hx    Stomach cancer Neg Hx     ROS: Review of Systems  Constitutional: Positive for weight loss.  Cardiovascular: Negative for chest pain.  Gastrointestinal: Negative for diarrhea, nausea and vomiting.  Musculoskeletal:       Negative for muscle weakness    PHYSICAL EXAM: Blood pressure 119/71, pulse 69, temperature 98.1 F (36.7 C), temperature source Oral, height 5\' 3"  (1.6 m), weight 195 lb (88.5 kg), last menstrual period 11/01/2003, SpO2 97 %. Body mass index is 34.54 kg/m. Physical Exam Vitals signs reviewed.  Constitutional:      Appearance: Normal appearance. She is well-developed. She is obese.  Cardiovascular:     Rate and Rhythm: Normal rate.  Pulmonary:     Effort: Pulmonary effort is normal.  Musculoskeletal: Normal range of motion.  Skin:    General: Skin is warm and dry.  Neurological:     Mental Status: She is alert and oriented to person, place, and time.  Psychiatric:        Mood and Affect: Mood normal.        Behavior: Behavior normal.     RECENT LABS AND TESTS: BMET    Component Value Date/Time   NA 140 09/05/2019 0837   K 3.9 09/05/2019 0837   CL 103 09/05/2019 0837   CO2 25 09/05/2019 0837   GLUCOSE 93 09/05/2019 0837   GLUCOSE 117 (H) 11/02/2017 1100   BUN 22 09/05/2019 0837   CREATININE 0.78 09/05/2019 0837   CALCIUM 9.3 09/05/2019 0837   GFRNONAA 82 09/05/2019 0837   GFRAA 95 09/05/2019 0837   Lab Results  Component Value Date   HGBA1C 5.6 09/05/2019   HGBA1C 5.5 05/02/2019   HGBA1C 6.3 (H) 09/20/2018   Lab Results  Component Value Date   INSULIN 8.3 09/05/2019   INSULIN 11.3 05/02/2019   INSULIN 17.2 09/20/2018   CBC    Component Value Date/Time   WBC 10.8 09/20/2018 1050  RBC 4.28 09/20/2018 1050   HGB 13.2 09/20/2018 1050   HCT 38.8 09/20/2018 1050   PLT 243 09/20/2018 1050   MCV 91 09/20/2018 1050   MCH 30.8 09/20/2018 1050   MCHC 34.0 09/20/2018 1050   RDW  12.4 09/20/2018 1050   LYMPHSABS 2.6 09/20/2018 1050   EOSABS 0.1 09/20/2018 1050   BASOSABS 0.0 09/20/2018 1050   Iron/TIBC/Ferritin/ %Sat No results found for: IRON, TIBC, FERRITIN, IRONPCTSAT Lipid Panel     Component Value Date/Time   CHOL 144 09/05/2019 0837   TRIG 121 09/05/2019 0837   HDL 41 09/05/2019 0837   CHOLHDL 3.6 09/20/2018 1050   LDLCALC 81 09/05/2019 0837   Hepatic Function Panel     Component Value Date/Time   PROT 6.6 09/05/2019 0837   ALBUMIN 4.7 09/05/2019 0837   AST 14 09/05/2019 0837   ALT 10 09/05/2019 0837   ALKPHOS 62 09/05/2019 0837   BILITOT 0.6 09/05/2019 0837      Component Value Date/Time   TSH 1.490 05/02/2019 0915   TSH 2.550 11/15/2018 1532   TSH 3.480 09/20/2018 1050     Ref. Range 05/02/2019 09:15  Vitamin D, 25-Hydroxy Latest Ref Range: 30.0 - 100.0 ng/mL 71.7    OBESITY BEHAVIORAL INTERVENTION VISIT  Today's visit was # 20   Starting weight: 219 lbs Starting date: 09/20/2018 Today's weight : 195 lbs Today's date: 09/05/2019 Total lbs lost to date: 24    09/05/2019  Height 5\' 3"  (1.6 m)  Weight 195 lb (88.5 kg)  BMI (Calculated) 34.55  BLOOD PRESSURE - SYSTOLIC 123456  BLOOD PRESSURE - DIASTOLIC 71   Body Fat % 123456 %  Total Body Water (lbs) 73.4 lbs    ASK: We discussed the diagnosis of obesity with [name] today and Anastasiya agreed to give [name] permission to discuss obesity behavioral modification therapy today.  ASSESS: Karra has the diagnosis of obesity and her BMI today is 34.55 Ambrianna is in the action stage of change   ADVISE: Tashyra was educated on the multiple health risks of obesity as well as the benefit of weight loss to improve her health. She was advised of the need for long term treatment and the importance of lifestyle modifications to improve her current health and to decrease her risk of future health problems.  AGREE: Multiple dietary modification options and treatment options were discussed and   Emilianna agreed to follow the recommendations documented in the above note.  ARRANGE: Braelie was educated on the importance of frequent visits to treat obesity as outlined per CMS and USPSTF guidelines and agreed to schedule her next follow up appointment today.  Corey Skains, am acting as transcriptionist for Abby Potash, PA-C I, Abby Potash, PA-C have reviewed above note and agree with its content

## 2019-09-24 ENCOUNTER — Ambulatory Visit (INDEPENDENT_AMBULATORY_CARE_PROVIDER_SITE_OTHER): Payer: 59 | Admitting: Physician Assistant

## 2019-09-24 ENCOUNTER — Encounter (INDEPENDENT_AMBULATORY_CARE_PROVIDER_SITE_OTHER): Payer: Self-pay | Admitting: Physician Assistant

## 2019-09-24 ENCOUNTER — Other Ambulatory Visit: Payer: Self-pay

## 2019-09-24 VITALS — BP 112/72 | HR 72 | Temp 98.0°F | Ht 63.0 in | Wt 195.0 lb

## 2019-09-24 DIAGNOSIS — Z9189 Other specified personal risk factors, not elsewhere classified: Secondary | ICD-10-CM | POA: Diagnosis not present

## 2019-09-24 DIAGNOSIS — E669 Obesity, unspecified: Secondary | ICD-10-CM | POA: Diagnosis not present

## 2019-09-24 DIAGNOSIS — E559 Vitamin D deficiency, unspecified: Secondary | ICD-10-CM

## 2019-09-24 DIAGNOSIS — R7303 Prediabetes: Secondary | ICD-10-CM | POA: Diagnosis not present

## 2019-09-24 DIAGNOSIS — Z6834 Body mass index (BMI) 34.0-34.9, adult: Secondary | ICD-10-CM

## 2019-09-24 DIAGNOSIS — E038 Other specified hypothyroidism: Secondary | ICD-10-CM

## 2019-09-24 MED ORDER — LEVOTHYROXINE SODIUM 88 MCG PO TABS: 88.0000 ug | ORAL_TABLET | Freq: Every day | ORAL | 0 refills | Status: DC

## 2019-09-24 MED ORDER — LEVOTHYROXINE SODIUM 100 MCG PO TABS: 100.0000 ug | ORAL_TABLET | Freq: Every day | ORAL | 1 refills | Status: DC

## 2019-09-24 MED ORDER — METFORMIN HCL 500 MG PO TABS: 500.0000 mg | ORAL_TABLET | Freq: Every day | ORAL | 0 refills | Status: DC

## 2019-09-24 MED FILL — LEVOTHYROXINE 88 MCG TABLET: 88 | 30 days supply | Qty: 30 | Fill #0

## 2019-09-24 MED FILL — metFORMIN HCL 500 MG TABS: 500 | 30 days supply | Qty: 30 | Fill #0

## 2019-09-24 MED FILL — LEVOTHYROXINE 100 MCG TABLE: 100 | 30 days supply | Qty: 30 | Fill #0

## 2019-09-30 NOTE — Progress Notes (Signed)
Office: [phone]  /  Fax: [phone]   HPI:   Chief Complaint: OBESITY Morgan Williams is here to discuss her progress with her obesity treatment plan. She is on the Category 2 plan and is following her eating plan approximately 75 to 80 % of the time. She states she is walking 1 1/2 miles 7 times per week. Morgan Williams states that she has done better with the plan overall, with the exception of the past few days, when her kids were in town. Morgan Williams has started walking daily. Her weight is 195 lb (88.5 kg) today and she has maintained weight since her last visit. She has lost 24 lbs since starting treatment with [name].  Pre-Diabetes Morgan Williams has a diagnosis of prediabetes based on her elevated Hgb A1c and was informed this puts her at greater risk of developing diabetes. Morgan Williams is on metformin and she denies nausea, vomiting or diarrhea. She continues to work on diet and exercise to decrease risk of diabetes. She has no polyphagia.  At risk for diabetes Morgan Williams is at higher than average risk for developing diabetes due to her obesity and prediabetes. She currently denies polyuria or polydipsia.  Hypothyroidism Morgan Williams has a diagnosis of hypothyroidism. She is on levothyroxine. She has no excessive fatigue and she denies hot or cold intolerance.  Vitamin D deficiency Morgan Williams has a diagnosis of vitamin D deficiency. Morgan Williams is currently taking vit D and she denies nausea, vomiting or muscle weakness.  ASSESSMENT AND PLAN:  Prediabetes - Plan: metFORMIN (GLUCOPHAGE) 500 MG tablet  Other specified hypothyroidism - Plan: levothyroxine (SYNTHROID) 100 MCG tablet, levothyroxine (SYNTHROID) 88 MCG tablet  Vitamin D deficiency  At risk for diabetes mellitus  Class 1 obesity with serious comorbidity and body mass index (BMI) of 34.0 to 34.9 in adult, unspecified obesity type  PLAN:  Pre-Diabetes Morgan Williams will continue to work on weight loss, exercise, and decreasing simple carbohydrates in her diet to help decrease  the risk of diabetes. She was informed that eating too many simple carbohydrates or too many calories at one sitting increases the likelihood of GI side effects. Morgan Williams agrees to continue metformin 500 mg daily #30 with no refills and follow up with [name] as directed to monitor her progress.  Diabetes risk counseling Morgan Williams was given extended (15 minutes) diabetes prevention counseling today. She is 63 y.o. female and has risk factors for diabetes including obesity and prediabetes. We discussed intensive lifestyle modifications today with an emphasis on weight loss as well as increasing exercise and decreasing simple carbohydrates in her diet.  Hypothyroidism Morgan Williams was informed of the importance of good thyroid control to help with weight loss efforts. She was also informed that supertherapeutic thyroid levels are dangerous and will not improve weight loss results. Morgan Williams agrees to continue levothyroxine 88 mcg daily before breakfast #45 with no refills and levothyroxine 100 mcg daily #45 with no refills and follow up as directed.  Vitamin D Deficiency Morgan Williams was informed that low vitamin D levels contributes to fatigue and are associated with obesity, breast, and colon cancer. Morgan Williams agrees to change Vit D to 4,000 IU daily and she will follow up for routine testing of vitamin D, at least 2-3 times per year. She was informed of the risk of over-replacement of vitamin D and agrees to not increase her dose unless she discusses this with [name] first. Morgan Williams agrees to follow up as directed.  Obesity Morgan Williams is currently in the action stage of change. As such, her goal is to continue  with weight loss efforts She has agreed to portion control better and make smarter food choices, such as increase vegetables and decrease simple carbohydrates  and follow the Category 2 plan Morgan Williams has been instructed to work up to a goal of 150 minutes of combined cardio and strengthening exercise per week for weight loss and overall  health benefits. We discussed the following Behavioral Modification Strategies today: increasing lean protein intake, work on meal planning and easy cooking plans and holiday eating strategies   Morgan Williams has agreed to follow up with our clinic in 2 weeks. She was informed of the importance of frequent follow up visits to maximize her success with intensive lifestyle modifications for her multiple health conditions.  ALLERGIES: Allergies  Allergen Reactions   Sulfa Antibiotics Rash    MEDICATIONS: Current Outpatient Medications on File Prior to Visit  Medication Sig Dispense Refill   acetaminophen (TYLENOL) 500 MG tablet Take 500 mg by mouth every 6 (six) hours as needed.     albuterol (VENTOLIN HFA) 108 (90 Base) MCG/ACT inhaler Inhale 1 puff into the lungs every 6 (six) hours as needed for wheezing or shortness of breath.     ALPRAZolam (XANAX) 0.5 MG tablet Take 0.5 mg by mouth at bedtime as needed for anxiety.     famotidine (PEPCID) 20 MG tablet Take 20 mg by mouth 2 (two) times daily.     hydrochlorothiazide (MICROZIDE) 12.5 MG capsule Take 12.5 mg by mouth daily.     losartan (COZAAR) 50 MG tablet Take 50 mg by mouth daily.     methocarbamol (ROBAXIN) 500 MG tablet methocarbamol 500 mg tablet  1 tablet po at bedtime as needed for muscle spasms     metroNIDAZOLE (METROCREAM) 0.75 % cream Apply 1 application topically 2 (two) times daily.     Multiple Vitamins-Minerals (MULTIVITAMIN WITH MINERALS) tablet Take 1 tablet by mouth daily.     Omega-3 Fatty Acids (FISH OIL) 1000 MG CPDR Take by mouth.     simvastatin (ZOCOR) 40 MG tablet Take 40 mg by mouth daily.     TURMERIC PO Take 450 mg by mouth daily.     VITAMIN D, ERGOCALCIFEROL, PO Take 5,000 mg by mouth every other day.      No current facility-administered medications on file prior to visit.     PAST MEDICAL HISTORY: Past Medical History:  Diagnosis Date   Allergy    Anxiety    Aortic atherosclerosis (HCC)     Arthritis    Asthma    Depression    Eczema    Fatty liver    GERD (gastroesophageal reflux disease)    Glaucoma    Graves disease    Heart murmur    Hyperlipidemia    Hypertension    Hypertensive retinopathy    Insomnia    Kidney problem    Obesity    Osteoarthritis    Osteopenia    Prediabetes    Rosacea    Thyroid disease    Vitamin D deficiency    Vocal cord nodules     PAST SURGICAL HISTORY: Past Surgical History:  Procedure Laterality Date   CESAREAN SECTION     x 1   MICROLARYNGOSCOPY Left 11/06/2017   Procedure: MICRO DIRECT LARYNGOSCOPY REMOVAL OF VOCAL CORD;  Surgeon: Jodi Marble, MD;  Location: [facility];  Service: ENT;  Laterality: Left;   THYROIDECTOMY, PARTIAL  1975   due to Grave's disease    SOCIAL HISTORY: Social History  Tobacco Use   Smoking status: Former Smoker    Years: 25.00    Types: Cigarettes    Quit date: 10/31/1998    Years since quitting: 20.9   Smokeless tobacco: Never Used  Substance Use Topics   Alcohol use: Yes    Alcohol/week: 2.0 standard drinks    Types: 2 Glasses of wine per week   Drug use: No    FAMILY HISTORY: Family History  Problem Relation Age of Onset   Hypertension Mother    Hyperlipidemia Mother    Diabetes Mother    Thyroid disease Mother    Hypertension Father    Heart disease Father        CHF   Colon cancer Father 67       stage 4   Kidney disease Father    Sleep apnea Father    Obesity Father    Thyroid disease Daughter    Esophageal cancer Neg Hx    Rectal cancer Neg Hx    Liver cancer Neg Hx    Pancreatic cancer Neg Hx    Stomach cancer Neg Hx     ROS: Review of Systems  Constitutional: Negative for malaise/fatigue and weight loss.  Gastrointestinal: Negative for diarrhea, nausea and vomiting.  Genitourinary: Negative for frequency.  Musculoskeletal:       Negative for muscle weakness  Endo/Heme/Allergies: Negative for  polydipsia.       Negative for polyphagia Negative for heat or cold intolerance    PHYSICAL EXAM: Blood pressure 112/72, pulse 72, temperature 98 F (36.7 C), temperature source Oral, height 5\' 3"  (1.6 m), weight 195 lb (88.5 kg), last menstrual period 11/01/2003, SpO2 98 %. Body mass index is 34.54 kg/m. Physical Exam Vitals signs reviewed.  Constitutional:      Appearance: Normal appearance. She is well-developed. She is obese.  Cardiovascular:     Rate and Rhythm: Normal rate.  Pulmonary:     Effort: Pulmonary effort is normal.  Musculoskeletal: Normal range of motion.  Skin:    General: Skin is warm and dry.  Neurological:     Mental Status: She is alert and oriented to person, place, and time.  Psychiatric:        Mood and Affect: Mood normal.        Behavior: Behavior normal.     RECENT LABS AND TESTS: BMET    Component Value Date/Time   NA 140 09/05/2019 0837   K 3.9 09/05/2019 0837   CL 103 09/05/2019 0837   CO2 25 09/05/2019 0837   GLUCOSE 93 09/05/2019 0837   GLUCOSE 117 (H) 11/02/2017 1100   BUN 22 09/05/2019 0837   CREATININE 0.78 09/05/2019 0837   CALCIUM 9.3 09/05/2019 0837   GFRNONAA 82 09/05/2019 0837   GFRAA 95 09/05/2019 0837   Lab Results  Component Value Date   HGBA1C 5.6 09/05/2019   HGBA1C 5.5 05/02/2019   HGBA1C 6.3 (H) 09/20/2018   Lab Results  Component Value Date   INSULIN 8.3 09/05/2019   INSULIN 11.3 05/02/2019   INSULIN 17.2 09/20/2018   CBC    Component Value Date/Time   WBC 10.8 09/20/2018 1050   RBC 4.28 09/20/2018 1050   HGB 13.2 09/20/2018 1050   HCT 38.8 09/20/2018 1050   PLT 243 09/20/2018 1050   MCV 91 09/20/2018 1050   MCH 30.8 09/20/2018 1050   MCHC 34.0 09/20/2018 1050   RDW 12.4 09/20/2018 1050   LYMPHSABS 2.6 09/20/2018 1050   EOSABS 0.1 09/20/2018 1050  BASOSABS 0.0 09/20/2018 1050   Iron/TIBC/Ferritin/ %Sat No results found for: IRON, TIBC, FERRITIN, IRONPCTSAT Lipid Panel     Component Value  Date/Time   CHOL 144 09/05/2019 0837   TRIG 121 09/05/2019 0837   HDL 41 09/05/2019 0837   CHOLHDL 3.6 09/20/2018 1050   LDLCALC 81 09/05/2019 0837   Hepatic Function Panel     Component Value Date/Time   PROT 6.6 09/05/2019 0837   ALBUMIN 4.7 09/05/2019 0837   AST 14 09/05/2019 0837   ALT 10 09/05/2019 0837   ALKPHOS 62 09/05/2019 0837   BILITOT 0.6 09/05/2019 0837      Component Value Date/Time   TSH 1.490 05/02/2019 0915   TSH 2.550 11/15/2018 1532   TSH 3.480 09/20/2018 1050     Ref. Range 09/05/2019 08:37  Vitamin D, 25-Hydroxy Latest Ref Range: 30.0 - 100.0 ng/mL 46.0    OBESITY BEHAVIORAL INTERVENTION VISIT  Today's visit was # 21   Starting weight: 219 lbs Starting date: 09/20/2018 Today's weight : 195 lbs Today's date: 09/24/2019 Total lbs lost to date: 24    09/24/2019  Height 5\' 3"  (1.6 m)  Weight 195 lb (88.5 kg)  BMI (Calculated) 34.55  BLOOD PRESSURE - SYSTOLIC [id number]  BLOOD PRESSURE - DIASTOLIC 72   Body Fat % 99991111 %  Total Body Water (lbs) 74.8 lbs    ASK: We discussed the diagnosis of obesity with Morgan Williams today and Morgan Williams agreed to give [name] permission to discuss obesity behavioral modification therapy today.  ASSESS: Tamye has the diagnosis of obesity and her BMI today is 34.55 Sholonda is in the action stage of change   ADVISE: Monai was educated on the multiple health risks of obesity as well as the benefit of weight loss to improve her health. She was advised of the need for long term treatment and the importance of lifestyle modifications to improve her current health and to decrease her risk of future health problems.  AGREE: Multiple dietary modification options and treatment options were discussed and  Jamarra agreed to follow the recommendations documented in the above note.  ARRANGE: Loanna was educated on the importance of frequent visits to treat obesity as outlined per CMS and USPSTF guidelines and agreed to schedule her next  follow up appointment today.  Corey Skains, am acting as transcriptionist for Abby Potash, PA-C I, Abby Potash, PA-C have reviewed above note and agree with its content

## 2019-10-04 DIAGNOSIS — M25562 Pain in left knee: Secondary | ICD-10-CM | POA: Diagnosis not present

## 2019-10-04 DIAGNOSIS — M170 Bilateral primary osteoarthritis of knee: Secondary | ICD-10-CM | POA: Diagnosis not present

## 2019-10-04 DIAGNOSIS — M1711 Unilateral primary osteoarthritis, right knee: Secondary | ICD-10-CM | POA: Diagnosis not present

## 2019-10-04 DIAGNOSIS — M1712 Unilateral primary osteoarthritis, left knee: Secondary | ICD-10-CM | POA: Diagnosis not present

## 2019-10-10 MED FILL — HYDROCHLOROTHIAZIDE 12.5 MG: 12.5 | 90 days supply | Qty: 90 | Fill #3

## 2019-10-10 MED FILL — LOSARTAN POTASSIUM 50 MG TA: 50 | 90 days supply | Qty: 90 | Fill #4

## 2019-10-10 MED FILL — SIMVASTATIN 40 MG TABLET: 40 | 90 days supply | Qty: 90 | Fill #3

## 2019-10-10 MED FILL — FAMOTIDINE 20 MG TABS: 20 | 90 days supply | Qty: 180 | Fill #1

## 2019-10-17 ENCOUNTER — Ambulatory Visit (INDEPENDENT_AMBULATORY_CARE_PROVIDER_SITE_OTHER): Payer: 59 | Admitting: Physician Assistant

## 2019-10-17 ENCOUNTER — Other Ambulatory Visit: Payer: Self-pay

## 2019-10-17 ENCOUNTER — Encounter (INDEPENDENT_AMBULATORY_CARE_PROVIDER_SITE_OTHER): Payer: Self-pay | Admitting: Physician Assistant

## 2019-10-17 VITALS — BP 128/80 | HR 73 | Temp 98.0°F | Ht 63.0 in | Wt 196.0 lb

## 2019-10-17 DIAGNOSIS — E559 Vitamin D deficiency, unspecified: Secondary | ICD-10-CM | POA: Diagnosis not present

## 2019-10-17 DIAGNOSIS — R7303 Prediabetes: Secondary | ICD-10-CM

## 2019-10-17 DIAGNOSIS — E669 Obesity, unspecified: Secondary | ICD-10-CM

## 2019-10-17 DIAGNOSIS — Z9189 Other specified personal risk factors, not elsewhere classified: Secondary | ICD-10-CM

## 2019-10-17 DIAGNOSIS — Z6834 Body mass index (BMI) 34.0-34.9, adult: Secondary | ICD-10-CM

## 2019-10-17 MED ORDER — METFORMIN HCL 500 MG PO TABS: 500.0000 mg | ORAL_TABLET | Freq: Every day | ORAL | 0 refills | Status: DC

## 2019-10-18 MED FILL — metFORMIN HCL 500 MG TABS: 500 | 30 days supply | Qty: 30 | Fill #0

## 2019-10-22 NOTE — Progress Notes (Signed)
Office: [phone]  /  Fax: [phone]   HPI:  Chief Complaint: OBESITY Morgan Williams is here to discuss her progress with her obesity treatment plan. She is on the Category 2 plan and states she is following her eating plan approximately 50 % of the time. She states she is walking 1 to 2 miles 4 times per week.  Morgan Williams reports that her eating has been "off" over the last few weeks with the holidays. She has had temptations at work and she is overeating her snack calories at home.  Pre-Diabetes Morgan Williams has a diagnosis of prediabetes. Morgan Williams is on metformin and she has no nausea, vomiting, diarrhea or polyphagia.    At risk for diabetes Morgan Williams is at higher than average risk for developing diabetes due to her obesity and prediabetes.   Vitamin D deficiency Morgan Williams has a diagnosis of vitamin D deficiency. She is on OTC vitamin D. Morgan Williams denies nausea, vomiting or muscle weakness.   Today's visit was # 22  Starting weight: 219 lbs Starting date: 09/20/2018 Today's weight : 196 lbs Today's date: 10/17/2019 Total lbs lost to date: 23 Total lbs lost since last in-office visit: 0  ASSESSMENT AND PLAN:  Prediabetes - Plan: metFORMIN (GLUCOPHAGE) 500 MG tablet  Vitamin D deficiency  At risk for diabetes mellitus  Class 1 obesity with serious comorbidity and body mass index (BMI) of 34.0 to 34.9 in adult, unspecified obesity type  PLAN:  Pre-Diabetes Morgan Williams will continue to work on weight loss, exercise, and decreasing simple carbohydrates to help decrease the risk of diabetes. Morgan Williams agrees to continue metformin 500 mg daily #30 with no refills and follow up as directed.  Diabetes risk counseling (~15 min) Morgan Williams is a 63 y.o. female and has risk factors for diabetes including obesity and prediabetes. We discussed intensive lifestyle modifications today with an emphasis on weight loss as well as increasing exercise and decreasing simple carbohydrates in her diet.  Vitamin D Deficiency Low  vitamin D level contributes to fatigue and are associated with obesity, breast, and colon cancer. She will continue with OTC Vit D @4 ,000 IU daily and she will follow up for routine testing of vitamin D, at least 2-3 times per year to avoid over-replacement.  Obesity Morgan Williams is currently in the action stage of change. As such, her goal is to continue with weight loss efforts She has agreed to follow the Category 2 plan Morgan Williams has been instructed to work up to a goal of 150 minutes of combined cardio and strengthening exercise per week for weight loss and overall health benefits. We discussed the following Behavioral Modification Strategies today: planning for success and work on meal planning and easy cooking plans  Morgan Williams has agreed to follow up with our clinic in 3 weeks. She was informed of the importance of frequent follow up visits to maximize her success with intensive lifestyle modifications for her multiple health conditions.  ALLERGIES: Allergies  Allergen Reactions  . Sulfa Antibiotics Rash    MEDICATIONS: Current Outpatient Medications on File Prior to Visit  Medication Sig Dispense Refill  . acetaminophen (TYLENOL) 500 MG tablet Take 500 mg by mouth every 6 (six) hours as needed.    Marland Kitchen albuterol (VENTOLIN HFA) 108 (90 Base) MCG/ACT inhaler Inhale 1 puff into the lungs every 6 (six) hours as needed for wheezing or shortness of breath.    . ALPRAZolam (XANAX) 0.5 MG tablet Take 0.5 mg by mouth at bedtime as needed for anxiety.    . famotidine (PEPCID)  20 MG tablet Take 20 mg by mouth 2 (two) times daily.    . hydrochlorothiazide (MICROZIDE) 12.5 MG capsule Take 12.5 mg by mouth daily.    Marland Kitchen levothyroxine (SYNTHROID) 100 MCG tablet Take 1 tablet (100 mcg total) by mouth daily. 45 tablet 1  . levothyroxine (SYNTHROID) 88 MCG tablet Take 1 tablet (88 mcg total) by mouth daily before breakfast. 45 tablet 0  . losartan (COZAAR) 50 MG tablet Take 50 mg by mouth daily.    . methocarbamol  (ROBAXIN) 500 MG tablet methocarbamol 500 mg tablet  1 tablet po at bedtime as needed for muscle spasms    . metroNIDAZOLE (METROCREAM) 0.75 % cream Apply 1 application topically 2 (two) times daily.    . Multiple Vitamins-Minerals (MULTIVITAMIN WITH MINERALS) tablet Take 1 tablet by mouth daily.    . Omega-3 Fatty Acids (FISH OIL) 1000 MG CPDR Take by mouth.    . simvastatin (ZOCOR) 40 MG tablet Take 40 mg by mouth daily.    . TURMERIC PO Take 450 mg by mouth daily.    Marland Kitchen VITAMIN D, ERGOCALCIFEROL, PO Take 5,000 mg by mouth every other day.      No current facility-administered medications on file prior to visit.    PAST MEDICAL HISTORY: Past Medical History:  Diagnosis Date  . Allergy   . Anxiety   . Aortic atherosclerosis ([place])   . Arthritis   . Asthma   . Depression   . Eczema   . Fatty liver   . GERD (gastroesophageal reflux disease)   . Glaucoma   . Graves disease   . Heart murmur   . Hyperlipidemia   . Hypertension   . Hypertensive retinopathy   . Insomnia   . Kidney problem   . Obesity   . Osteoarthritis   . Osteopenia   . Prediabetes   . Rosacea   . Thyroid disease   . Vitamin D deficiency   . Vocal cord nodules     PAST SURGICAL HISTORY: Past Surgical History:  Procedure Laterality Date  . CESAREAN SECTION     x 1  . MICROLARYNGOSCOPY Left 11/06/2017   Procedure: MICRO DIRECT LARYNGOSCOPY REMOVAL OF VOCAL CORD;  Surgeon: Jodi Marble, MD;  Location: [facility];  Service: ENT;  Laterality: Left;  . THYROIDECTOMY, PARTIAL  1975   due to Grave's disease    SOCIAL HISTORY: Social History   Tobacco Use  . Smoking status: Former Smoker    Years: 25.00    Types: Cigarettes    Quit date: 10/31/1998    Years since quitting: 20.9  . Smokeless tobacco: Never Used  Substance Use Topics  . Alcohol use: Yes    Alcohol/week: 2.0 standard drinks    Types: 2 Glasses of wine per week  . Drug use: No    FAMILY HISTORY: Family History  Problem  Relation Age of Onset  . Hypertension Mother   . Hyperlipidemia Mother   . Diabetes Mother   . Thyroid disease Mother   . Hypertension Father   . Heart disease Father        CHF  . Colon cancer Father 75       stage 4  . Kidney disease Father   . Sleep apnea Father   . Obesity Father   . Thyroid disease Daughter   . Esophageal cancer Neg Hx   . Rectal cancer Neg Hx   . Liver cancer Neg Hx   . Pancreatic cancer Neg Hx   .  Stomach cancer Neg Hx     ROS: Review of Systems  Constitutional: Negative for weight loss.  Gastrointestinal: Negative for diarrhea, nausea and vomiting.  Musculoskeletal:       Negative for muscle weakness  Endo/Heme/Allergies:       Negative for polyphagia    PHYSICAL EXAM: Blood pressure 128/80, pulse 73, temperature 98 F (36.7 C), temperature source Oral, height 5\' 3"  (1.6 m), weight 196 lb (88.9 kg), last menstrual period 11/01/2003, SpO2 99 %. Body mass index is 34.72 kg/m. Physical Exam Vitals reviewed.  Constitutional:      General: She is not in acute distress.    Appearance: Normal appearance. She is well-developed. She is obese.  Cardiovascular:     Rate and Rhythm: Normal rate.  Pulmonary:     Effort: Pulmonary effort is normal.  Musculoskeletal:        General: Normal range of motion.  Skin:    General: Skin is warm and dry.  Neurological:     Mental Status: She is alert and oriented to person, place, and time.  Psychiatric:        Mood and Affect: Mood normal.        Behavior: Behavior normal.     RECENT LABS AND TESTS: BMET    Component Value Date/Time   NA 140 09/05/2019 0837   K 3.9 09/05/2019 0837   CL 103 09/05/2019 0837   CO2 25 09/05/2019 0837   GLUCOSE 93 09/05/2019 0837   GLUCOSE 117 (H) 11/02/2017 1100   BUN 22 09/05/2019 0837   CREATININE 0.78 09/05/2019 0837   CALCIUM 9.3 09/05/2019 0837   GFRNONAA 82 09/05/2019 0837   GFRAA 95 09/05/2019 0837   Lab Results  Component Value Date   HGBA1C 5.6  09/05/2019   HGBA1C 5.5 05/02/2019   HGBA1C 6.3 (H) 09/20/2018   Lab Results  Component Value Date   INSULIN 8.3 09/05/2019   INSULIN 11.3 05/02/2019   INSULIN 17.2 09/20/2018   CBC    Component Value Date/Time   WBC 10.8 09/20/2018 1050   RBC 4.28 09/20/2018 1050   HGB 13.2 09/20/2018 1050   HCT 38.8 09/20/2018 1050   PLT 243 09/20/2018 1050   MCV 91 09/20/2018 1050   MCH 30.8 09/20/2018 1050   MCHC 34.0 09/20/2018 1050   RDW 12.4 09/20/2018 1050   LYMPHSABS 2.6 09/20/2018 1050   EOSABS 0.1 09/20/2018 1050   BASOSABS 0.0 09/20/2018 1050   Iron/TIBC/Ferritin/ %Sat No results found for: IRON, TIBC, FERRITIN, IRONPCTSAT Lipid Panel     Component Value Date/Time   CHOL 144 09/05/2019 0837   TRIG 121 09/05/2019 0837   HDL 41 09/05/2019 0837   CHOLHDL 3.6 09/20/2018 1050   LDLCALC 81 09/05/2019 0837   Hepatic Function Panel     Component Value Date/Time   PROT 6.6 09/05/2019 0837   ALBUMIN 4.7 09/05/2019 0837   AST 14 09/05/2019 0837   ALT 10 09/05/2019 0837   ALKPHOS 62 09/05/2019 0837   BILITOT 0.6 09/05/2019 0837      Component Value Date/Time   TSH 1.490 05/02/2019 0915   TSH 2.550 11/15/2018 1532   TSH 3.480 09/20/2018 1050     Ref. Range 09/05/2019 08:37  Vitamin D, 25-Hydroxy Latest Ref Range: 30.0 - 100.0 ng/mL 46.0    I, Doreene Nest, am acting as Location manager for Abby Potash, PA-C I, Abby Potash, PA-C have reviewed above note and agree with its content

## 2019-10-23 MED FILL — LEVOTHYROXINE 88 MCG TABLET: 88 | 15 days supply | Qty: 15 | Fill #1

## 2019-10-23 MED FILL — LEVOTHYROXINE 100 MCG TABLE: 100 | 30 days supply | Qty: 30 | Fill #1

## 2019-10-29 MED FILL — metroNIDAZOLE 0.75 % LOTN: 0.75 | 90 days supply | Qty: 177 | Fill #1

## 2019-11-05 DIAGNOSIS — H1045 Other chronic allergic conjunctivitis: Secondary | ICD-10-CM | POA: Diagnosis not present

## 2019-11-05 DIAGNOSIS — H40023 Open angle with borderline findings, high risk, bilateral: Secondary | ICD-10-CM | POA: Diagnosis not present

## 2019-11-14 ENCOUNTER — Ambulatory Visit (INDEPENDENT_AMBULATORY_CARE_PROVIDER_SITE_OTHER): Payer: 59 | Admitting: Physician Assistant

## 2019-11-14 ENCOUNTER — Encounter (INDEPENDENT_AMBULATORY_CARE_PROVIDER_SITE_OTHER): Payer: Self-pay | Admitting: Physician Assistant

## 2019-11-14 ENCOUNTER — Other Ambulatory Visit: Payer: Self-pay

## 2019-11-14 VITALS — BP 117/76 | HR 72 | Temp 98.6°F | Ht 63.0 in | Wt 198.0 lb

## 2019-11-14 DIAGNOSIS — E66812 Obesity, class 2: Secondary | ICD-10-CM

## 2019-11-14 DIAGNOSIS — Z6835 Body mass index (BMI) 35.0-35.9, adult: Secondary | ICD-10-CM

## 2019-11-14 DIAGNOSIS — E038 Other specified hypothyroidism: Secondary | ICD-10-CM | POA: Diagnosis not present

## 2019-11-14 DIAGNOSIS — E559 Vitamin D deficiency, unspecified: Secondary | ICD-10-CM

## 2019-11-18 NOTE — Progress Notes (Signed)
Chief Complaint:   OBESITY Morgan Williams is here to discuss her progress with her obesity treatment plan along with follow-up of her obesity related diagnoses. Morgan Williams is on the Category 2 Plan and states she is following her eating plan approximately 50% of the time. Morgan Williams states she is walking 45 minutes 4 times per week.  Today's visit was #: 23 Starting weight: 219 lbs Starting date: 09/20/2018 Today's weight: 198 lbs Today's date: 11/14/2019 Total lbs lost to date: 21 Total lbs lost since last in-office visit: 0  Interim History: Morgan Williams states that she overindulged over the holidays. She has not been meal planning.  Subjective:   Other specified hypothyroidism Morgan Williams is on levothyroxine 88 mcg and 100 mcg on alternating days. She has no heat or cold intolerance.  Lab Results  Component Value Date   TSH 1.490 05/02/2019   Vitamin D deficiency Morgan Williams's Vitamin D level was 46.0 on 09/05/19. She is on OTC vitamin D 4,000 IU daily. She denies nausea, vomiting or muscle weakness.  Assessment/Plan:   Other specified hypothyroidism Patient with long-standing hypothyroidism, on levothyroxine therapy. She appears euthyroid. Morgan Williams will continue with medications. Orders and follow up as documented in patient record.  Counseling . Good thyroid control is important for overall health. Supratherapeutic thyroid levels are dangerous and will not improve weight loss results. . The correct way to take levothyroxine is fasting, with water, separated by at least 30 minutes from breakfast, and separated by more than 4 hours from calcium, iron, multivitamins, acid reflux medications (PPIs).  .  Vitamin D deficiency Low Vitamin D level contributes to fatigue and are associated with obesity, breast, and colon cancer. Morgan Williams will continue to take OTC Vitamin D @4 ,000 IU daily and she will follow-up for routine testing of Vitamin D, at least 2-3 times per year to avoid  over-replacement.  Obesity Morgan Williams is currently in the action stage of change. As such, her goal is to continue with weight loss efforts. She has agreed to on the Category 2 Plan.   Exercise goals: For substantial health benefits, adults should do at least 150 minutes (2 hours and 30 minutes) a week of moderate-intensity, or 75 minutes (1 hour and 15 minutes) a week of vigorous-intensity aerobic physical activity, or an equivalent combination of moderate- and vigorous-intensity aerobic activity. Aerobic activity should be performed in episodes of at least 10 minutes, and preferably, it should be spread throughout the week. Adults should also include muscle-strengthening activities that involve all major muscle groups on 2 or more days a week.  Behavioral modification strategies: meal planning and cooking strategies and planning for success.  Morgan Williams has agreed to follow-up with our clinic in 2 weeks. She was informed of the importance of frequent follow-up visits to maximize her success with intensive lifestyle modifications for her multiple health conditions.   Objective:   Blood pressure 117/76, pulse 72, temperature 98.6 F (37 C), temperature source Oral, height 5\' 3"  (1.6 m), weight 198 lb (89.8 kg), last menstrual period 11/01/2003, SpO2 99 %. Body mass index is 35.07 kg/m.  General: Cooperative, alert, well developed, in no acute distress. HEENT: Conjunctivae and lids unremarkable. Cardiovascular: Regular rhythm.  Lungs: Normal work of breathing. Neurologic: No focal deficits.   Lab Results  Component Value Date   CREATININE 0.78 09/05/2019   BUN 22 09/05/2019   NA 140 09/05/2019   K 3.9 09/05/2019   CL 103 09/05/2019   CO2 25 09/05/2019   Lab Results  Component Value Date   ALT 10 09/05/2019   AST 14 09/05/2019   ALKPHOS 62 09/05/2019   BILITOT 0.6 09/05/2019   Lab Results  Component Value Date   HGBA1C 5.6 09/05/2019   HGBA1C 5.5 05/02/2019   HGBA1C 6.3 (H) 09/20/2018    Lab Results  Component Value Date   INSULIN 8.3 09/05/2019   INSULIN 11.3 05/02/2019   INSULIN 17.2 09/20/2018   Lab Results  Component Value Date   TSH 1.490 05/02/2019   Lab Results  Component Value Date   CHOL 144 09/05/2019   HDL 41 09/05/2019   LDLCALC 81 09/05/2019   TRIG 121 09/05/2019   CHOLHDL 3.6 09/20/2018   Lab Results  Component Value Date   WBC 10.8 09/20/2018   HGB 13.2 09/20/2018   HCT 38.8 09/20/2018   MCV 91 09/20/2018   PLT 243 09/20/2018   No results found for: IRON, TIBC, FERRITIN   Ref. Range 09/05/2019 08:37  Vitamin D, 25-Hydroxy Latest Ref Range: 30.0 - 100.0 ng/mL 46.0    Attestation Statements:   Reviewed by clinician on day of visit: allergies, medications, problem list, medical history, surgical history, family history, social history, and previous encounter notes.  Time spent on visit including pre-visit chart review and post-visit care was 31 minutes.   Corey Skains, am acting as Location manager for [employer], PA-C.  I have reviewed the above documentation for accuracy and completeness, and I agree with the above. Abby Potash, PA-C

## 2019-11-28 ENCOUNTER — Other Ambulatory Visit: Payer: Self-pay

## 2019-11-28 ENCOUNTER — Ambulatory Visit (INDEPENDENT_AMBULATORY_CARE_PROVIDER_SITE_OTHER): Payer: 59 | Admitting: Physician Assistant

## 2019-11-28 ENCOUNTER — Other Ambulatory Visit: Payer: Self-pay | Admitting: Family Medicine

## 2019-11-28 VITALS — BP 115/74 | HR 60 | Temp 98.2°F | Ht 63.0 in | Wt 194.0 lb

## 2019-11-28 DIAGNOSIS — R7303 Prediabetes: Secondary | ICD-10-CM | POA: Diagnosis not present

## 2019-11-28 DIAGNOSIS — E669 Obesity, unspecified: Secondary | ICD-10-CM | POA: Diagnosis not present

## 2019-11-28 DIAGNOSIS — E7849 Other hyperlipidemia: Secondary | ICD-10-CM | POA: Diagnosis not present

## 2019-11-28 DIAGNOSIS — Z1231 Encounter for screening mammogram for malignant neoplasm of breast: Secondary | ICD-10-CM

## 2019-11-28 DIAGNOSIS — Z9189 Other specified personal risk factors, not elsewhere classified: Secondary | ICD-10-CM

## 2019-11-28 DIAGNOSIS — Z6834 Body mass index (BMI) 34.0-34.9, adult: Secondary | ICD-10-CM

## 2019-11-28 NOTE — Progress Notes (Signed)
Chief Complaint:   OBESITY Morgan Williams is here to discuss her progress with her obesity treatment plan along with follow-up of her obesity related diagnoses. Morgan Williams is on the Category 2 Plan and states she is following her eating plan approximately 80% of the time. Morgan Williams states she is walking 2 miles 4 times per week.  Today's visit was #: 24 Starting weight: 219 lbs Starting date: 09/20/2018 Today's weight: 194 lbs Today's date: 11/28/2019 Total lbs lost to date: 25  Total lbs lost since last in-office visit: 4  Interim History: Morgan Williams has done a much better job meal planning the last 2 weeks. She has started walking and notes that her energy is better.  Subjective:   Prediabetes. Morgan Williams has a diagnosis of prediabetes based on her elevated HgA1c and was informed this puts her at greater risk of developing diabetes. She continues to work on diet and exercise to decrease her risk of diabetes. She denies nausea, vomiting, or diarrhea. Morgan Williams is on metformin once daily.   Lab Results  Component Value Date   HGBA1C 5.6 09/05/2019   Lab Results  Component Value Date   INSULIN 8.3 09/05/2019   INSULIN 11.3 05/02/2019   INSULIN 17.2 09/20/2018   Other hyperlipidemia. Morgan Williams is on Zocor. No chest pain. No myalgias. She has exercised regularly the last 2 weeks.   Lab Results  Component Value Date   CHOL 144 09/05/2019   HDL 41 09/05/2019   LDLCALC 81 09/05/2019   TRIG 121 09/05/2019   CHOLHDL 3.6 09/20/2018   Lab Results  Component Value Date   ALT 10 09/05/2019   AST 14 09/05/2019   ALKPHOS 62 09/05/2019   BILITOT 0.6 09/05/2019   The 10-year ASCVD risk score Morgan Williams DC Jr., et al., 2013) is: 7.2%   Values used to calculate the score:     Age: 64 years     Sex: Female     Is Non-Hispanic African American: No     Diabetic: Yes     Tobacco smoker: No     Systolic Blood Pressure: [id number]     Is BP treated: Yes     HDL Cholesterol: 41 mg/dL     Total Cholesterol:  144 mg/dL  At risk for diabetes mellitus. Morgan Williams is at higher than average risk for developing diabetes due to her obesity.   Assessment/Plan:   Prediabetes. Morgan Williams will continue to work on weight loss, exercise, and decreasing simple carbohydrates to help decrease the risk of diabetes.   Other hyperlipidemia. Cardiovascular risk and specific lipid/LDL goals reviewed.  We discussed several lifestyle modifications today and Morgan Williams will continue her medications, work on diet, exercise and weight loss efforts. Orders and follow up as documented in patient record.   Counseling Intensive lifestyle modifications are the first line treatment for this issue. . Dietary changes: Increase soluble fiber. Decrease simple carbohydrates. . Exercise changes: Moderate to vigorous-intensity aerobic activity 150 minutes per week if tolerated. . Lipid-lowering medications: see documented in medical record.  At risk for diabetes mellitus. Morgan Williams was given approximately 15 minutes of diabetes education and counseling today. We discussed intensive lifestyle modifications today with an emphasis on weight loss as well as increasing exercise and decreasing simple carbohydrates in her diet. We also reviewed medication options with an emphasis on risk versus benefit of those discussed.   Repetitive spaced learning was employed today to elicit superior memory formation and behavioral change.  Class 1 obesity with serious comorbidity and  body mass index (BMI) of 34.0 to 34.9 in adult, unspecified obesity type.  Morgan Williams is currently in the action stage of change. As such, her goal is to continue with weight loss efforts. She has agreed to the Category 2 Plan.   Exercise goals: For substantial health benefits, adults should do at least 150 minutes (2 hours and 30 minutes) a week of moderate-intensity, or 75 minutes (1 hour and 15 minutes) a week of vigorous-intensity aerobic physical activity, or an equivalent combination of  moderate- and vigorous-intensity aerobic activity. Aerobic activity should be performed in episodes of at least 10 minutes, and preferably, it should be spread throughout the week.  Behavioral modification strategies: meal planning and cooking strategies and planning for success.  Morgan Williams has agreed to follow-up with our clinic in 2 weeks. She was informed of the importance of frequent follow-up visits to maximize her success with intensive lifestyle modifications for her multiple health conditions.   Objective:   Blood pressure 115/74, pulse 60, temperature 98.2 F (36.8 C), temperature source Oral, height 5\' 3"  (1.6 m), weight 194 lb (88 kg), last menstrual period 11/01/2003, SpO2 97 %. Body mass index is 34.37 kg/m.  General: Cooperative, alert, well developed, in no acute distress. HEENT: Conjunctivae and lids unremarkable. Cardiovascular: Regular rhythm.  Lungs: Normal work of breathing. Neurologic: No focal deficits.   Lab Results  Component Value Date   CREATININE 0.78 09/05/2019   BUN 22 09/05/2019   NA 140 09/05/2019   K 3.9 09/05/2019   CL 103 09/05/2019   CO2 25 09/05/2019   Lab Results  Component Value Date   ALT 10 09/05/2019   AST 14 09/05/2019   ALKPHOS 62 09/05/2019   BILITOT 0.6 09/05/2019   Lab Results  Component Value Date   HGBA1C 5.6 09/05/2019   HGBA1C 5.5 05/02/2019   HGBA1C 6.3 (H) 09/20/2018   Lab Results  Component Value Date   INSULIN 8.3 09/05/2019   INSULIN 11.3 05/02/2019   INSULIN 17.2 09/20/2018   Lab Results  Component Value Date   TSH 1.490 05/02/2019   Lab Results  Component Value Date   CHOL 144 09/05/2019   HDL 41 09/05/2019   LDLCALC 81 09/05/2019   TRIG 121 09/05/2019   CHOLHDL 3.6 09/20/2018   Lab Results  Component Value Date   WBC 10.8 09/20/2018   HGB 13.2 09/20/2018   HCT 38.8 09/20/2018   MCV 91 09/20/2018   PLT 243 09/20/2018   No results found for: IRON, TIBC, FERRITIN  Attestation Statements:    Reviewed by clinician on day of visit: allergies, medications, problem list, medical history, surgical history, family history, social history, and previous encounter notes.  Morgan Williams, am acting as transcriptionist for Abby Potash, PA-C   I have reviewed the above documentation for accuracy and completeness, and I agree with the above. Abby Potash, PA-C

## 2019-12-03 ENCOUNTER — Ambulatory Visit
Admission: RE | Admit: 2019-12-03 | Discharge: 2019-12-03 | Disposition: A | Discharge status: Home / Self Care | Payer: 59 | Source: Ambulatory Visit | Attending: Sports Medicine | Admitting: Sports Medicine

## 2019-12-03 ENCOUNTER — Other Ambulatory Visit: Payer: Self-pay | Admitting: *Deleted

## 2019-12-03 ENCOUNTER — Other Ambulatory Visit: Payer: Self-pay

## 2019-12-03 ENCOUNTER — Ambulatory Visit (INDEPENDENT_AMBULATORY_CARE_PROVIDER_SITE_OTHER): Payer: 59 | Admitting: Sports Medicine

## 2019-12-03 DIAGNOSIS — M175 Other unilateral secondary osteoarthritis of knee: Secondary | ICD-10-CM | POA: Diagnosis not present

## 2019-12-03 DIAGNOSIS — M25562 Pain in left knee: Secondary | ICD-10-CM

## 2019-12-03 DIAGNOSIS — M1712 Unilateral primary osteoarthritis, left knee: Secondary | ICD-10-CM | POA: Diagnosis not present

## 2019-12-03 DIAGNOSIS — G8929 Other chronic pain: Secondary | ICD-10-CM

## 2019-12-03 MED ORDER — METHYLPREDNISOLONE ACETATE 40 MG/ML IJ SUSP
40.0000 mg | Freq: Once | INTRAMUSCULAR | Status: AC
  Administered 2019-12-03: 40 mg via INTRA_ARTICULAR

## 2019-12-03 NOTE — Assessment & Plan Note (Addendum)
Patient with severe osteoarthritis of the left knee as noted on x-ray and with patient's history and physical. Patient would benefit from knee replacement and was encouraged to follow up with her orthopedic doctor regarding further discussion. She is unable to take NSAIDs due to kidney hx. She uses tylenol prn. Injection attempted today for relief. Patient is pre-diabetic, so will avoid oral steroids at this time.  A steroid injection was performed at lateral knee using 1% plain Lidocaine and 30mg  of Depo-medrol. This was well tolerated.

## 2019-12-03 NOTE — Progress Notes (Signed)
HPI  CC: Left knee pain  Patient is 64yo female with left knee pain. Patient has been following with [facility] ortho for a few years now and has been trying to avoid a knee replacement. She is coming in today with worsening knee pain. She had an Xray of her knee this am. She has no new trauma to knee, but is concerned with the worsening pain. She states her pain is usually located over the medical aspect of her knee but it has now moved tot he back of her and over the anterior aspect of the knee. She has had injections in the past which would last 6 to 8 weeks however her last injection on 12/412/6 did not help her at all.  Patient reports that she has lost 25 pounds over the past few months but this has not helped significantly with her pain.  She states that she has been trying to walk but she has no limited to walking less than a mile before her knee begins to hurt.  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  See HPI and/or previous note for associated ROS.  Objective: BP 132/82   Ht 5\' 3"  (1.6 m)   Wt 196 lb (88.9 kg)   LMP 11/01/2003 (Approximate)   BMI 34.72 kg/m  Gen: NAD, well groomed, a/o x3, normal affect.  CV: Well-perfused. Warm.  Resp: Non-labored.  Neuro: Sensation intact throughout. No gross coordination deficits.  Gait: Nonpathologic posture, unremarkable stride without signs of limp or balance issues. Knee, L:  Inspection with no erythema or effusion, varus deformity. No obvious Baker's cysts Palpation normal with no warmth or patellar tenderness or condyle tenderness. Not TTP along infrapatellar or pes anserine bursas.   ROM normal in flexion (135 degrees) and limited extension (10 degrees), normal lower leg rotation. Ligaments with solid consistent endpoints including ACL, PCL, LCL, MCL. Negative Anterior Drawer/Lachman. Negative Mcmurray's  Non painful patellar compression. Normal Patellar glide.    Patellar and quadriceps tendons unremarkable. Hamstring and quadriceps strength is normal. Neurovascularly intact B/L LE   Assessment and plan:  Osteoarthritis of left knee Patient with severe osteoarthritis of the left knee as noted on x-ray and with patient's history and physical. Patient would benefit from knee replacement and was encouraged to follow up with her orthopedic doctor regarding further discussion. She is unable to take NSAIDs due to kidney hx. She uses tylenol prn. Injection attempted today for relief. Patient is pre-diabetic, so will avoid oral steroids at this time.  A steroid injection was performed at lateral knee using 1% plain Lidocaine and 30mg  of Depo-medrol. This was well tolerated.  [place] Janisha Bueso, DO PGY-3, [facility]  12/03/2019 4:12 PM  Patient seen and evaluated with the resident.  I agree with the above plan of care.  Today's x-rays show bone-on-bone medial compartmental DJD.  Definitive treatment is a total knee arthroplasty.  We will reinject the patient's left knee today but she understands that she may need to start planning for her total knee arthroplasty.  Follow-up with me as needed.  Consent obtained and verified. Time-out conducted. Noted no overlying erythema, induration, or other signs of local infection. Skin prepped in a sterile fashion. Topical analgesic spray: Ethyl chloride. Joint: left knee, anterior lateral approach Needle: 25g 1.5 inch, Completed without difficulty. Meds: 3cc 1% xylocaine, 1cc (40mg ) depomedrol  Advised to call if fevers/chills, erythema, induration, drainage, or persistent  bleeding.

## 2019-12-04 ENCOUNTER — Encounter: Payer: Self-pay | Admitting: Sports Medicine

## 2019-12-12 ENCOUNTER — Encounter (INDEPENDENT_AMBULATORY_CARE_PROVIDER_SITE_OTHER): Payer: Self-pay | Admitting: Physician Assistant

## 2019-12-12 ENCOUNTER — Ambulatory Visit (INDEPENDENT_AMBULATORY_CARE_PROVIDER_SITE_OTHER): Payer: 59 | Admitting: Physician Assistant

## 2019-12-12 ENCOUNTER — Other Ambulatory Visit: Payer: Self-pay

## 2019-12-12 VITALS — BP 125/80 | HR 66 | Temp 98.4°F | Ht 63.0 in | Wt 191.0 lb

## 2019-12-12 DIAGNOSIS — R7303 Prediabetes: Secondary | ICD-10-CM | POA: Diagnosis not present

## 2019-12-12 DIAGNOSIS — E559 Vitamin D deficiency, unspecified: Secondary | ICD-10-CM | POA: Diagnosis not present

## 2019-12-12 DIAGNOSIS — Z6833 Body mass index (BMI) 33.0-33.9, adult: Secondary | ICD-10-CM

## 2019-12-12 DIAGNOSIS — Z9189 Other specified personal risk factors, not elsewhere classified: Secondary | ICD-10-CM

## 2019-12-12 DIAGNOSIS — E669 Obesity, unspecified: Secondary | ICD-10-CM | POA: Diagnosis not present

## 2019-12-12 MED ORDER — METFORMIN HCL 500 MG PO TABS: 500.0000 mg | ORAL_TABLET | Freq: Every day | ORAL | 0 refills | Status: DC

## 2019-12-12 MED FILL — metFORMIN HCL 500 MG TABS: 500 | 30 days supply | Qty: 30 | Fill #0

## 2019-12-12 NOTE — Progress Notes (Signed)
Chief Complaint:   OBESITY Morgan Williams is here to discuss her progress with her obesity treatment plan along with follow-up of her obesity related diagnoses. Morgan Williams is on the Category 2 Plan and states she is following her eating plan approximately 80% of the time. Morgan Williams states she is exercising 0 minutes 0 times per week.  Today's visit was #: 25 Starting weight: 219 lbs Starting date: 09/20/2018 Today's weight: 191 lbs Today's date: 12/12/2019 Total lbs lost to date: 27  Total lbs lost since last in-office visit: 3  Interim History: Morgan Williams continues to do a better job with meal planning. She indulged some during Super Bowl.  Subjective:   Prediabetes. Morgan Williams has a diagnosis of prediabetes based on her elevated HgA1c and was informed this puts her at greater risk of developing diabetes. She continues to work on diet and exercise to decrease her risk of diabetes. She denies nausea, vomiting, or diarrhea. Morgan Williams is on metformin. No polyphagia.  Lab Results  Component Value Date   HGBA1C 5.6 09/05/2019   Lab Results  Component Value Date   INSULIN 8.3 09/05/2019   INSULIN 11.3 05/02/2019   INSULIN 17.2 09/20/2018   Vitamin D deficiency. Morgan Williams is on OTC Vitamin D 4,000 units daily. No nausea, vomiting, or muscle weakness. Last Vitamin D level not at goal - 46.0 on 09/05/2019.  At risk for osteoporosis. Morgan Williams is at higher risk of osteopenia and osteoporosis due to Vitamin D deficiency.   Assessment/Plan:   Prediabetes. Morgan Williams will continue to work on weight loss, exercise, and decreasing simple carbohydrates to help decrease the risk of diabetes. She was given a refill on her metFORMIN (GLUCOPHAGE) 500 MG tablet #30 with 0 refills.  Vitamin D deficiency. Low Vitamin D level contributes to fatigue and are associated with obesity, breast, and colon cancer. She agrees to continue to take Vitamin D and will follow-up for routine testing of Vitamin D, at least 2-3 times per year  to avoid over-replacement.  At risk for osteoporosis. Morgan Williams was given approximately 15 minutes of osteoporosis prevention counseling today. Morgan Williams is at risk for osteopenia and osteoporosis due to her Vitamin D deficiency. She was encouraged to take her Vitamin D and follow her higher calcium diet and increase strengthening exercise to help strengthen her bones and decrease her risk of osteopenia and osteoporosis.  Repetitive spaced learning was employed today to elicit superior memory formation and behavioral change.  Class 1 obesity with serious comorbidity and body mass index (BMI) of 33.0 to 33.9 in adult, unspecified obesity type.  Morgan Williams is currently in the action stage of change. As such, her goal is to continue with weight loss efforts. She has agreed to the Category 2 Plan.   Exercise goals: For substantial health benefits, adults should do at least 150 minutes (2 hours and 30 minutes) a week of moderate-intensity, or 75 minutes (1 hour and 15 minutes) a week of vigorous-intensity aerobic physical activity, or an equivalent combination of moderate- and vigorous-intensity aerobic activity. Aerobic activity should be performed in episodes of at least 10 minutes, and preferably, it should be spread throughout the week.  Behavioral modification strategies: meal planning and cooking strategies and planning for success.  Morgan Williams has agreed to follow-up with our clinic in 2 weeks. She was informed of the importance of frequent follow-up visits to maximize her success with intensive lifestyle modifications for her multiple health conditions.   Objective:   Blood pressure 125/80, pulse 66, temperature 98.4 F (  36.9 C), temperature source Oral, height 5\' 3"  (1.6 m), weight 191 lb (86.6 kg), last menstrual period 11/01/2003, SpO2 97 %. Body mass index is 33.83 kg/m.  General: Cooperative, alert, well developed, in no acute distress. HEENT: Conjunctivae and lids unremarkable. Cardiovascular:  Regular rhythm.  Lungs: Normal work of breathing. Neurologic: No focal deficits.   Lab Results  Component Value Date   CREATININE 0.78 09/05/2019   BUN 22 09/05/2019   NA 140 09/05/2019   K 3.9 09/05/2019   CL 103 09/05/2019   CO2 25 09/05/2019   Lab Results  Component Value Date   ALT 10 09/05/2019   AST 14 09/05/2019   ALKPHOS 62 09/05/2019   BILITOT 0.6 09/05/2019   Lab Results  Component Value Date   HGBA1C 5.6 09/05/2019   HGBA1C 5.5 05/02/2019   HGBA1C 6.3 (H) 09/20/2018   Lab Results  Component Value Date   INSULIN 8.3 09/05/2019   INSULIN 11.3 05/02/2019   INSULIN 17.2 09/20/2018   Lab Results  Component Value Date   TSH 1.490 05/02/2019   Lab Results  Component Value Date   CHOL 144 09/05/2019   HDL 41 09/05/2019   LDLCALC 81 09/05/2019   TRIG 121 09/05/2019   CHOLHDL 3.6 09/20/2018   Lab Results  Component Value Date   WBC 10.8 09/20/2018   HGB 13.2 09/20/2018   HCT 38.8 09/20/2018   MCV 91 09/20/2018   PLT 243 09/20/2018   No results found for: IRON, TIBC, FERRITIN  Attestation Statements:   Reviewed by clinician on day of visit: allergies, medications, problem list, medical history, surgical history, family history, social history, and previous encounter notes.  Morgan Williams, am acting as transcriptionist for Morgan Potash, PA-C   I have reviewed the above documentation for accuracy and completeness, and I agree with the above. Morgan Potash, PA-C

## 2019-12-17 DIAGNOSIS — E559 Vitamin D deficiency, unspecified: Secondary | ICD-10-CM | POA: Diagnosis not present

## 2019-12-17 DIAGNOSIS — R7303 Prediabetes: Secondary | ICD-10-CM | POA: Diagnosis not present

## 2019-12-17 DIAGNOSIS — E7800 Pure hypercholesterolemia, unspecified: Secondary | ICD-10-CM | POA: Diagnosis not present

## 2019-12-17 DIAGNOSIS — J45909 Unspecified asthma, uncomplicated: Secondary | ICD-10-CM | POA: Diagnosis not present

## 2019-12-17 DIAGNOSIS — I10 Essential (primary) hypertension: Secondary | ICD-10-CM | POA: Diagnosis not present

## 2019-12-17 DIAGNOSIS — Z0000 Encounter for general adult medical examination without abnormal findings: Secondary | ICD-10-CM | POA: Diagnosis not present

## 2019-12-17 DIAGNOSIS — E039 Hypothyroidism, unspecified: Secondary | ICD-10-CM | POA: Diagnosis not present

## 2019-12-26 DIAGNOSIS — L570 Actinic keratosis: Secondary | ICD-10-CM | POA: Diagnosis not present

## 2019-12-26 DIAGNOSIS — L821 Other seborrheic keratosis: Secondary | ICD-10-CM | POA: Diagnosis not present

## 2019-12-26 DIAGNOSIS — L812 Freckles: Secondary | ICD-10-CM | POA: Diagnosis not present

## 2019-12-26 DIAGNOSIS — D225 Melanocytic nevi of trunk: Secondary | ICD-10-CM | POA: Diagnosis not present

## 2019-12-26 MED FILL — TRIAMCINOLONE 0.1% OINTMEN: 0.1 | 15 days supply | Qty: 30 | Fill #0

## 2019-12-26 MED FILL — FLUOROURACIL 5 % CREA: 5 | 14 days supply | Qty: 40 | Fill #0

## 2019-12-27 DIAGNOSIS — I129 Hypertensive chronic kidney disease with stage 1 through stage 4 chronic kidney disease, or unspecified chronic kidney disease: Secondary | ICD-10-CM | POA: Diagnosis not present

## 2019-12-27 DIAGNOSIS — E559 Vitamin D deficiency, unspecified: Secondary | ICD-10-CM | POA: Diagnosis not present

## 2019-12-27 DIAGNOSIS — E039 Hypothyroidism, unspecified: Secondary | ICD-10-CM | POA: Diagnosis not present

## 2019-12-27 DIAGNOSIS — J45909 Unspecified asthma, uncomplicated: Secondary | ICD-10-CM | POA: Diagnosis not present

## 2019-12-27 DIAGNOSIS — K219 Gastro-esophageal reflux disease without esophagitis: Secondary | ICD-10-CM | POA: Diagnosis not present

## 2019-12-27 DIAGNOSIS — I10 Essential (primary) hypertension: Secondary | ICD-10-CM | POA: Diagnosis not present

## 2019-12-27 DIAGNOSIS — E7800 Pure hypercholesterolemia, unspecified: Secondary | ICD-10-CM | POA: Diagnosis not present

## 2019-12-27 DIAGNOSIS — R7303 Prediabetes: Secondary | ICD-10-CM | POA: Diagnosis not present

## 2019-12-27 DIAGNOSIS — N172 Acute kidney failure with medullary necrosis: Secondary | ICD-10-CM | POA: Diagnosis not present

## 2019-12-31 ENCOUNTER — Other Ambulatory Visit: Payer: Self-pay

## 2019-12-31 ENCOUNTER — Ambulatory Visit (INDEPENDENT_AMBULATORY_CARE_PROVIDER_SITE_OTHER): Payer: 59 | Admitting: Physician Assistant

## 2019-12-31 ENCOUNTER — Encounter (INDEPENDENT_AMBULATORY_CARE_PROVIDER_SITE_OTHER): Payer: Self-pay | Admitting: Physician Assistant

## 2019-12-31 VITALS — BP 103/69 | HR 62 | Temp 97.8°F | Ht 63.0 in | Wt 190.0 lb

## 2019-12-31 DIAGNOSIS — E7849 Other hyperlipidemia: Secondary | ICD-10-CM

## 2019-12-31 DIAGNOSIS — E669 Obesity, unspecified: Secondary | ICD-10-CM | POA: Diagnosis not present

## 2019-12-31 DIAGNOSIS — Z6833 Body mass index (BMI) 33.0-33.9, adult: Secondary | ICD-10-CM

## 2019-12-31 NOTE — Progress Notes (Signed)
Chief Complaint:   OBESITY Morgan Williams is here to discuss her progress with her obesity treatment plan along with follow-up of her obesity related diagnoses. Morgan Williams is on the Category 2 Plan and states she is following her eating plan approximately 20% of the time. [name] states she is walking for 45 minutes 3-4 times per week.  Today's visit was #: 43 Starting weight: 219 lbs Starting date: 09/20/2018 Today's weight: 190 lbs Today's date: 12/31/2019 Total lbs lost to date: 29 lbs Total lbs lost since last in-office visit: 1 lb  Interim History: Morgan Williams states that she had multiple birthday celebrations and indulged in cake, as well as ate out more often.  She had lab work with her PCP last week.  Subjective:   1. Other hyperlipidemia Morgan Williams has hyperlipidemia and has been trying to improve her cholesterol levels with intensive lifestyle modification including a low saturated fat diet, exercise and weight loss. She denies any chest pain, claudication or myalgias.  She takes Zocor daily.  Lab Results  Component Value Date   ALT 10 09/05/2019   AST 14 09/05/2019   ALKPHOS 62 09/05/2019   BILITOT 0.6 09/05/2019   Lab Results  Component Value Date   CHOL 144 09/05/2019   HDL 41 09/05/2019   LDLCALC 81 09/05/2019   TRIG 121 09/05/2019   CHOLHDL 3.6 09/20/2018   Assessment/Plan:   1. Other hyperlipidemia Cardiovascular risk and specific lipid/LDL goals reviewed.  We discussed several lifestyle modifications today and Morgan Williams will continue to work on diet, exercise and weight loss efforts. Orders and follow up as documented in patient record. Morgan Williams will continue her medications.  Counseling Intensive lifestyle modifications are the first line treatment for this issue. . Dietary changes: Increase soluble fiber. Decrease simple carbohydrates. . Exercise changes: Moderate to vigorous-intensity aerobic activity 150 minutes per week if tolerated. . Lipid-lowering medications: see documented  in medical record.  2. Class 1 obesity with serious comorbidity and body mass index (BMI) of 33.0 to 33.9 in adult, unspecified obesity type Morgan Williams is currently in the action stage of change. As such, her goal is to continue with weight loss efforts. She has agreed to the Category 2 Plan.   Exercise goals: As is.  Behavioral modification strategies: meal planning and cooking strategies and dealing with family or coworker sabotage.  Morgan Williams has agreed to follow-up with our clinic in 2 weeks. She was informed of the importance of frequent follow-up visits to maximize her success with intensive lifestyle modifications for her multiple health conditions.   Objective:   Blood pressure 103/69, pulse 62, temperature 97.8 F (36.6 C), height 5\' 3"  (1.6 m), weight 190 lb (86.2 kg), last menstrual period 11/01/2003, SpO2 97 %. Body mass index is 33.66 kg/m.  General: Cooperative, alert, well developed, in no acute distress. HEENT: Conjunctivae and lids unremarkable. Cardiovascular: Regular rhythm.  Lungs: Normal work of breathing. Neurologic: No focal deficits.   Lab Results  Component Value Date   CREATININE 0.78 09/05/2019   BUN 22 09/05/2019   NA 140 09/05/2019   K 3.9 09/05/2019   CL 103 09/05/2019   CO2 25 09/05/2019   Lab Results  Component Value Date   ALT 10 09/05/2019   AST 14 09/05/2019   ALKPHOS 62 09/05/2019   BILITOT 0.6 09/05/2019   Lab Results  Component Value Date   HGBA1C 5.6 09/05/2019   HGBA1C 5.5 05/02/2019   HGBA1C 6.3 (H) 09/20/2018   Lab Results  Component Value Date  INSULIN 8.3 09/05/2019   INSULIN 11.3 05/02/2019   INSULIN 17.2 09/20/2018   Lab Results  Component Value Date   TSH 1.490 05/02/2019   Lab Results  Component Value Date   CHOL 144 09/05/2019   HDL 41 09/05/2019   LDLCALC 81 09/05/2019   TRIG 121 09/05/2019   CHOLHDL 3.6 09/20/2018   Lab Results  Component Value Date   WBC 10.8 09/20/2018   HGB 13.2 09/20/2018   HCT 38.8  09/20/2018   MCV 91 09/20/2018   PLT 243 09/20/2018   Attestation Statements:   Reviewed by clinician on day of visit: allergies, medications, problem list, medical history, surgical history, family history, social history, and previous encounter notes.  Time spent on visit including pre-visit chart review and post-visit care and charting was 31 minutes.   I, Water quality scientist, CMA, am acting as Location manager for [employer], PA-C.  I have reviewed the above documentation for accuracy and completeness, and I agree with the above. Abby Potash, PA-C

## 2020-01-02 MED FILL — ALPRAZolam 0.5 MG TABS: 0.5 | 30 days supply | Qty: 30 | Fill #0

## 2020-01-09 ENCOUNTER — Other Ambulatory Visit: Payer: Self-pay

## 2020-01-09 ENCOUNTER — Ambulatory Visit
Admission: RE | Admit: 2020-01-09 | Discharge: 2020-01-09 | Disposition: A | Discharge status: Home / Self Care | Payer: 59 | Source: Ambulatory Visit | Attending: Family Medicine | Admitting: Family Medicine

## 2020-01-09 DIAGNOSIS — Z1231 Encounter for screening mammogram for malignant neoplasm of breast: Secondary | ICD-10-CM | POA: Diagnosis not present

## 2020-01-09 DIAGNOSIS — M1712 Unilateral primary osteoarthritis, left knee: Secondary | ICD-10-CM | POA: Diagnosis not present

## 2020-01-14 ENCOUNTER — Other Ambulatory Visit: Payer: Self-pay

## 2020-01-14 ENCOUNTER — Encounter (INDEPENDENT_AMBULATORY_CARE_PROVIDER_SITE_OTHER): Payer: Self-pay | Admitting: Physician Assistant

## 2020-01-14 ENCOUNTER — Ambulatory Visit (INDEPENDENT_AMBULATORY_CARE_PROVIDER_SITE_OTHER): Payer: 59 | Admitting: Physician Assistant

## 2020-01-14 VITALS — BP 113/64 | HR 63 | Temp 98.0°F | Ht 63.0 in | Wt 190.0 lb

## 2020-01-14 DIAGNOSIS — R7303 Prediabetes: Secondary | ICD-10-CM | POA: Diagnosis not present

## 2020-01-14 DIAGNOSIS — E669 Obesity, unspecified: Secondary | ICD-10-CM

## 2020-01-14 DIAGNOSIS — E038 Other specified hypothyroidism: Secondary | ICD-10-CM | POA: Diagnosis not present

## 2020-01-14 DIAGNOSIS — Z9189 Other specified personal risk factors, not elsewhere classified: Secondary | ICD-10-CM

## 2020-01-14 DIAGNOSIS — Z6833 Body mass index (BMI) 33.0-33.9, adult: Secondary | ICD-10-CM | POA: Diagnosis not present

## 2020-01-14 MED ORDER — LEVOTHYROXINE SODIUM 100 MCG PO TABS: 100.0000 ug | ORAL_TABLET | Freq: Every day | ORAL | 0 refills | Status: DC

## 2020-01-14 MED ORDER — METFORMIN HCL 500 MG PO TABS: 500.0000 mg | ORAL_TABLET | Freq: Every day | ORAL | 0 refills | Status: DC

## 2020-01-14 MED ORDER — LEVOTHYROXINE SODIUM 88 MCG PO TABS: 88.0000 ug | ORAL_TABLET | Freq: Every day | ORAL | 0 refills | Status: DC

## 2020-01-14 MED FILL — LEVOTHYROXINE 88 MCG TABLET: 88 | 45 days supply | Qty: 45 | Fill #0

## 2020-01-14 MED FILL — LEVOTHYROXINE 100 MCG TABLE: 100 | 45 days supply | Qty: 45 | Fill #0

## 2020-01-14 MED FILL — metFORMIN HCL 500 MG TABS: 500 | 30 days supply | Qty: 30 | Fill #0

## 2020-01-14 NOTE — Progress Notes (Signed)
Chief Complaint:   OBESITY Morgan Williams is here to discuss her progress with her obesity treatment plan along with follow-up of her obesity related diagnoses. Momoko is on the Category 2 Plan and states she is following her eating plan approximately 70% of the time. Shamira states she is walking 2 miles 2-3 times per week.  Today's visit was #: 31 Starting weight: 219 lbs Starting date: 09/20/2018 Today's weight: 190 lbs Today's date: 01/14/2020 Total lbs lost to date: 29 Total lbs lost since last in-office visit: 0  Interim History: Prue had a friend come visit for a week and did not eat completely on plan in that time. She is now back on track. She has knee replacement surgery coming up in May.  Subjective:   Other specified hypothyroidism. Josey is on levothyroxine. No heat/cold intolerance. No excessive fatigue.   Lab Results  Component Value Date   TSH 1.490 05/02/2019   Prediabetes. Shekima has a diagnosis of prediabetes based on her elevated HgA1c and was informed this puts her at greater risk of developing diabetes. She continues to work on diet and exercise to decrease her risk of diabetes. She denies nausea, vomiting, or diarrhea. Amarrie is on metformin. No polyphagia.  Lab Results  Component Value Date   HGBA1C 5.6 09/05/2019   Lab Results  Component Value Date   INSULIN 8.3 09/05/2019   INSULIN 11.3 05/02/2019   INSULIN 17.2 09/20/2018   At risk for diabetes mellitus. Ayris is at higher than average risk for developing diabetes due to her obesity.   Assessment/Plan:   Other specified hypothyroidism. Patient with long-standing hypothyroidism, on levothyroxine therapy. She appears euthyroid. Orders and follow up as documented in patient record. Shagun was given a refill on her levothyroxine (SYNTHROID) 100 MCG tablet #30 with 0 refills and levothyroxine (SYNTHROID) 88 MCG #30 with 0 refills.  Counseling  Good thyroid control is important for overall  health. Supratherapeutic thyroid levels are dangerous and will not improve weight loss results.  The correct way to take levothyroxine is fasting, with water, separated by at least 30 minutes from breakfast, and separated by more than 4 hours from calcium, iron, multivitamins, acid reflux medications (PPIs).     Prediabetes. Kiran will continue to work on weight loss, exercise, and decreasing simple carbohydrates to help decrease the risk of diabetes. She was given a refill on her metFORMIN (GLUCOPHAGE) 500 MG tablet #30 with 0 refills.  At risk for diabetes mellitus. Finnlee was given approximately 15 minutes of diabetes education and counseling today. We discussed intensive lifestyle modifications today with an emphasis on weight loss as well as increasing exercise and decreasing simple carbohydrates in her diet. We also reviewed medication options with an emphasis on risk versus benefit of those discussed.   Repetitive spaced learning was employed today to elicit superior memory formation and behavioral change.  Class 1 obesity with serious comorbidity and body mass index (BMI) of 33.0 to 33.9 in adult, unspecified obesity type.  Elyn is currently in the action stage of change. As such, her goal is to continue with weight loss efforts. She has agreed to the Category 2 Plan.   Exercise goals: For substantial health benefits, adults should do at least 150 minutes (2 hours and 30 minutes) a week of moderate-intensity, or 75 minutes (1 hour and 15 minutes) a week of vigorous-intensity aerobic physical activity, or an equivalent combination of moderate- and vigorous-intensity aerobic activity. Aerobic activity should be performed in episodes  of at least 10 minutes, and preferably, it should be spread throughout the week.  Behavioral modification strategies: meal planning and cooking strategies and travel eating strategies.  Dorleen has agreed to follow-up with our clinic in 3 weeks. She was informed of  the importance of frequent follow-up visits to maximize her success with intensive lifestyle modifications for her multiple health conditions.   Objective:   Blood pressure 113/64, pulse 63, temperature 98 F (36.7 C), height 5\' 3"  (1.6 m), weight 190 lb (86.2 kg), last menstrual period 11/01/2003, SpO2 100 %. Body mass index is 33.66 kg/m.  General: Cooperative, alert, well developed, in no acute distress. HEENT: Conjunctivae and lids unremarkable. Cardiovascular: Regular rhythm.  Lungs: Normal work of breathing. Neurologic: No focal deficits.   Lab Results  Component Value Date   CREATININE 0.78 09/05/2019   BUN 22 09/05/2019   NA 140 09/05/2019   K 3.9 09/05/2019   CL 103 09/05/2019   CO2 25 09/05/2019   Lab Results  Component Value Date   ALT 10 09/05/2019   AST 14 09/05/2019   ALKPHOS 62 09/05/2019   BILITOT 0.6 09/05/2019   Lab Results  Component Value Date   HGBA1C 5.6 09/05/2019   HGBA1C 5.5 05/02/2019   HGBA1C 6.3 (H) 09/20/2018   Lab Results  Component Value Date   INSULIN 8.3 09/05/2019   INSULIN 11.3 05/02/2019   INSULIN 17.2 09/20/2018   Lab Results  Component Value Date   TSH 1.490 05/02/2019   Lab Results  Component Value Date   CHOL 144 09/05/2019   HDL 41 09/05/2019   LDLCALC 81 09/05/2019   TRIG 121 09/05/2019   CHOLHDL 3.6 09/20/2018   Lab Results  Component Value Date   WBC 10.8 09/20/2018   HGB 13.2 09/20/2018   HCT 38.8 09/20/2018   MCV 91 09/20/2018   PLT 243 09/20/2018   No results found for: IRON, TIBC, FERRITIN  Attestation Statements:   Reviewed by clinician on day of visit: allergies, medications, problem list, medical history, surgical history, family history, social history, and previous encounter notes.  IMichaelene Song, am acting as transcriptionist for Abby Potash, PA-C   I have reviewed the above documentation for accuracy and completeness, and I agree with the above. Abby Potash, PA-C

## 2020-01-15 MED FILL — HYDROCHLOROTHIAZIDE 12.5 MG: 12.5 | 90 days supply | Qty: 90 | Fill #0

## 2020-01-15 MED FILL — FAMOTIDINE 20 MG TABS: 20 | 90 days supply | Qty: 180 | Fill #0

## 2020-01-15 MED FILL — SIMVASTATIN 40 MG TABLET: 40 | 90 days supply | Qty: 90 | Fill #0

## 2020-01-15 MED FILL — LOSARTAN POTASSIUM 50 MG TA: 50 | 90 days supply | Qty: 90 | Fill #0

## 2020-02-06 ENCOUNTER — Encounter (INDEPENDENT_AMBULATORY_CARE_PROVIDER_SITE_OTHER): Payer: Self-pay | Admitting: Physician Assistant

## 2020-02-06 ENCOUNTER — Other Ambulatory Visit: Payer: Self-pay

## 2020-02-06 ENCOUNTER — Ambulatory Visit (INDEPENDENT_AMBULATORY_CARE_PROVIDER_SITE_OTHER): Payer: 59 | Admitting: Physician Assistant

## 2020-02-06 VITALS — BP 116/73 | HR 73 | Temp 98.0°F | Ht 63.0 in | Wt 188.0 lb

## 2020-02-06 DIAGNOSIS — E669 Obesity, unspecified: Secondary | ICD-10-CM | POA: Diagnosis not present

## 2020-02-06 DIAGNOSIS — E559 Vitamin D deficiency, unspecified: Secondary | ICD-10-CM

## 2020-02-06 DIAGNOSIS — Z6833 Body mass index (BMI) 33.0-33.9, adult: Secondary | ICD-10-CM

## 2020-02-06 DIAGNOSIS — E7849 Other hyperlipidemia: Secondary | ICD-10-CM | POA: Diagnosis not present

## 2020-02-06 NOTE — Progress Notes (Signed)
Chief Complaint:   OBESITY Morgan Williams is here to discuss her progress with her obesity treatment plan along with follow-up of her obesity related diagnoses. Morgan Williams is on the Category 2 Plan and states she is following her eating plan approximately 70% of the time. Morgan Williams states she is walking 45 minutes 3-4 times per week.  Today's visit was #: 28 Starting weight: 219 lbs Starting date: 09/20/2018 Today's weight: 188 lbs Today's date: 02/06/2020 Total lbs lost to date: 31 Total lbs lost since last in-office visit: 2  Interim History: Morgan Williams states that she did well most of the time, but indulged over Easter and the last few nights for dinner. She is traveling to [place] and then [place] in the next few weeks.  Subjective:   Other hyperlipidemia. Morgan Williams is on Zocor. No chest pain. No myalgias.   Lab Results  Component Value Date   CHOL 144 09/05/2019   HDL 41 09/05/2019   LDLCALC 81 09/05/2019   TRIG 121 09/05/2019   CHOLHDL 3.6 09/20/2018   Lab Results  Component Value Date   ALT 10 09/05/2019   AST 14 09/05/2019   ALKPHOS 62 09/05/2019   BILITOT 0.6 09/05/2019   The 10-year ASCVD risk score Mikey Bussing DC Jr., et al., 2013) is: 8.1%   Values used to calculate the score:     Age: 64 years     Sex: Female     Is Non-Hispanic African American: No     Diabetic: Yes     Tobacco smoker: No     Systolic Blood Pressure: 99991111 mmHg     Is BP treated: Yes     HDL Cholesterol: 41 mg/dL     Total Cholesterol: 144 mg/dL  Vitamin D deficiency. Morgan Williams is on OTC Vitamin D. No nausea, vomiting, or muscle weakness. She is walking outside often. Last Vitamin D 46.0 on 09/05/2019.  Assessment/Plan:   Other hyperlipidemia. Cardiovascular risk and specific lipid/LDL goals reviewed.  We discussed several lifestyle modifications today and Morgan Williams will continue to work on diet, exercise and weight loss efforts. Orders and follow up as documented in patient record. Morgan Williams will continue her  medication as directed.  Counseling Intensive lifestyle modifications are the first line treatment for this issue. . Dietary changes: Increase soluble fiber. Decrease simple carbohydrates. . Exercise changes: Moderate to vigorous-intensity aerobic activity 150 minutes per week if tolerated. . Lipid-lowering medications: see documented in medical record.  Vitamin D deficiency. Low Vitamin D level contributes to fatigue and are associated with obesity, breast, and colon cancer. She agrees to continue to take Vitamin D and will follow-up for routine testing of Vitamin D, at least 2-3 times per year to avoid over-replacement.  Class 1 obesity with serious comorbidity and body mass index (BMI) of 33.0 to 33.9 in adult, unspecified obesity type.  Morgan Williams is currently in the action stage of change. As such, her goal is to continue with weight loss efforts. She has agreed to the Category 2 Plan.   Exercise goals: For substantial health benefits, adults should do at least 150 minutes (2 hours and 30 minutes) a week of moderate-intensity, or 75 minutes (1 hour and 15 minutes) a week of vigorous-intensity aerobic physical activity, or an equivalent combination of moderate- and vigorous-intensity aerobic activity. Aerobic activity should be performed in episodes of at least 10 minutes, and preferably, it should be spread throughout the week.  Behavioral modification strategies: increasing lean protein intake and meal planning and cooking  strategies.  Morgan Williams has agreed to follow-up with our clinic in 4 weeks. She was informed of the importance of frequent follow-up visits to maximize her success with intensive lifestyle modifications for her multiple health conditions.   Objective:   Blood pressure 116/73, pulse 73, temperature 98 F (36.7 C), temperature source Oral, height 5\' 3"  (1.6 m), weight 188 lb (85.3 kg), last menstrual period 11/01/2003, SpO2 97 %. Body mass index is 33.3 kg/m.  General:  Cooperative, alert, well developed, in no acute distress. HEENT: Conjunctivae and lids unremarkable. Cardiovascular: Regular rhythm.  Lungs: Normal work of breathing. Neurologic: No focal deficits.   Lab Results  Component Value Date   CREATININE 0.78 09/05/2019   BUN 22 09/05/2019   NA 140 09/05/2019   K 3.9 09/05/2019   CL 103 09/05/2019   CO2 25 09/05/2019   Lab Results  Component Value Date   ALT 10 09/05/2019   AST 14 09/05/2019   ALKPHOS 62 09/05/2019   BILITOT 0.6 09/05/2019   Lab Results  Component Value Date   HGBA1C 5.6 09/05/2019   HGBA1C 5.5 05/02/2019   HGBA1C 6.3 (H) 09/20/2018   Lab Results  Component Value Date   INSULIN 8.3 09/05/2019   INSULIN 11.3 05/02/2019   INSULIN 17.2 09/20/2018   Lab Results  Component Value Date   TSH 1.490 05/02/2019   Lab Results  Component Value Date   CHOL 144 09/05/2019   HDL 41 09/05/2019   LDLCALC 81 09/05/2019   TRIG 121 09/05/2019   CHOLHDL 3.6 09/20/2018   Lab Results  Component Value Date   WBC 10.8 09/20/2018   HGB 13.2 09/20/2018   HCT 38.8 09/20/2018   MCV 91 09/20/2018   PLT 243 09/20/2018   No results found for: IRON, TIBC, FERRITIN  Attestation Statements:   Reviewed by clinician on day of visit: allergies, medications, problem list, medical history, surgical history, family history, social history, and previous encounter notes.  Time spent on visit including pre-visit chart review and post-visit charting and care was 30 minutes.   IMichaelene Song, am acting as transcriptionist for Abby Potash, PA-C   I have reviewed the above documentation for accuracy and completeness, and I agree with the above. Abby Potash, PA-C

## 2020-02-10 NOTE — H&P (Signed)
TOTAL KNEE ADMISSION H&P  Patient is being admitted for left total knee arthroplasty.  Subjective:  Chief Complaint: Left knee pain.  HPI: Morgan Williams, 64 y.o. female has a history of pain and functional disability in the left knee due to arthritis and has failed non-surgical conservative treatments for greater than 12 weeks to include corticosteriod injections and activity modification. Onset of symptoms was gradual, starting >10 years ago with gradually worsening course since that time. The patient noted no past surgery on the left knee.  Patient currently rates pain in the left knee at 9 out of 10 with activity. Patient has worsening of pain with activity and weight bearing, pain that interferes with activities of daily living, crepitus and joint swelling. Patient has evidence of  severe bone-on-bone osteoarthritis in the medial compartment with patellofemoral bone-on-bone. She has slight varus deformity  by imaging studies. There is no active infection.  Patient Active Problem List   Diagnosis Date Noted   Fatty liver    Hypertension    Obesity    Osteoarthritis of left knee 09/06/2018   Osteoarthritis 03/09/2018   Lesion of true vocal cord 09/09/2017   Gastroesophageal reflux disease 09/08/2017    Past Medical History:  Diagnosis Date   Allergy    Anxiety    Aortic atherosclerosis (HCC)    Arthritis    Asthma    Depression    Eczema    Fatty liver    GERD (gastroesophageal reflux disease)    Glaucoma    Graves disease    Heart murmur    Hyperlipidemia    Hypertension    Hypertensive retinopathy    Insomnia    Kidney problem    Obesity    Osteoarthritis    Osteopenia    Prediabetes    Rosacea    Thyroid disease    Vitamin D deficiency    Vocal cord nodules     Past Surgical History:  Procedure Laterality Date   CESAREAN SECTION     x 1   MICROLARYNGOSCOPY Left 11/06/2017   Procedure: MICRO DIRECT LARYNGOSCOPY REMOVAL OF VOCAL CORD;  Surgeon: Jodi Marble, MD;  Location: [facility];  Service: ENT;  Laterality: Left;   THYROIDECTOMY, PARTIAL  1975   due to Grave's disease    Prior to Admission medications   Medication Sig Start Date End Date Taking? Authorizing Provider  acetaminophen (TYLENOL) 500 MG tablet Take 500 mg by mouth every 6 (six) hours as needed.    [provider]  albuterol (VENTOLIN HFA) 108 (90 Base) MCG/ACT inhaler Inhale 1 puff into the lungs every 6 (six) hours as needed for wheezing or shortness of breath.    [provider]  ALPRAZolam Duanne Moron) 0.5 MG tablet Take 0.5 mg by mouth at bedtime as needed for anxiety.    [provider]  Cholecalciferol 100 MCG (4000 UT) CAPS Take 1 capsule by mouth daily.    [provider]  famotidine (PEPCID) 20 MG tablet Take 20 mg by mouth 2 (two) times daily.    [provider]  hydrochlorothiazide (MICROZIDE) 12.5 MG capsule Take 12.5 mg by mouth daily.    [provider]  levothyroxine (SYNTHROID) 100 MCG tablet Take 1 tablet (100 mcg total) by mouth daily. 01/14/20   Abby Potash, PA-C  levothyroxine (SYNTHROID) 88 MCG tablet Take 1 tablet (88 mcg total) by mouth daily before breakfast. 01/14/20   Abby Potash, PA-C  losartan (COZAAR) 50 MG tablet Take 50  mg by mouth daily.    [provider]  metFORMIN (GLUCOPHAGE) 500 MG tablet Take 1 tablet (500 mg total) by mouth daily with supper. 01/14/20   Abby Potash, PA-C  methocarbamol (ROBAXIN) 500 MG tablet methocarbamol 500 mg tablet  1 tablet po at bedtime as needed for muscle spasms 08/31/18   [provider]  metroNIDAZOLE (METROCREAM) 0.75 % cream Apply 1 application topically 2 (two) times daily.    [provider]  Multiple Vitamins-Minerals (MULTIVITAMIN WITH MINERALS) tablet Take 1 tablet by mouth daily.    [provider]  Omega-3 Fatty Acids (FISH OIL) 1000 MG CPDR Take by mouth.    [provider]    simvastatin (ZOCOR) 40 MG tablet Take 40 mg by mouth daily.    [provider]    Allergies  Allergen Reactions   Sulfa Antibiotics Rash    Social History   Socioeconomic History   Marital status: Divorced    Spouse name: Not on file   Number of children: 2   Years of education: Not on file   Highest education level: Not on file  Occupational History   Occupation: Pharmacist  Tobacco Use   Smoking status: Former Smoker    Years: 25.00    Types: Cigarettes    Quit date: 10/31/1998    Years since quitting: 21.2   Smokeless tobacco: Never Used  Substance and Sexual Activity   Alcohol use: Yes    Alcohol/week: 2.0 standard drinks    Types: 2 Glasses of wine per week   Drug use: No   Sexual activity: Not Currently  Other Topics Concern   Not on file  Social History Narrative   Not on file   Social Determinants of Health   Financial Resource Strain:    Difficulty of Paying Living Expenses:   Food Insecurity:    Worried About Charity fundraiser in the Last Year:    Arboriculturist in the Last Year:   Transportation Needs:    Film/video editor (Medical):    Lack of Transportation (Non-Medical):   Physical Activity:    Days of Exercise per Week:    Minutes of Exercise per Session:   Stress:    Feeling of Stress :   Social Connections:    Frequency of Communication with Friends and Family:    Frequency of Social Gatherings with Friends and Family:    Attends Religious Services:    Active Member of Clubs or Organizations:    Attends Archivist Meetings:    Marital Status:   Intimate Partner Violence:    Fear of Current or Ex-Partner:    Emotionally Abused:    Physically Abused:    Sexually Abused:       Tobacco Use: Medium Risk   Smoking Tobacco Use: Former Smoker   Smokeless Tobacco Use: Never Used   Social History   Substance and Sexual Activity  Alcohol Use Yes   Alcohol/week: 2.0 standard drinks   Types: 2 Glasses of wine  per week    Family History  Problem Relation Age of Onset   Hypertension Mother    Hyperlipidemia Mother    Diabetes Mother    Thyroid disease Mother    Hypertension Father    Heart disease Father        CHF   Colon cancer Father 34       stage 4   Kidney disease Father    Sleep apnea Father  Obesity Father    Thyroid disease Daughter    Esophageal cancer Neg Hx    Rectal cancer Neg Hx    Liver cancer Neg Hx    Pancreatic cancer Neg Hx    Stomach cancer Neg Hx     Review of Systems  Constitutional: Negative for chills and fever.  HENT: Negative for congestion, sore throat and tinnitus.   Eyes: Negative for double vision, photophobia and pain.  Respiratory: Negative for cough, shortness of breath and wheezing.   Cardiovascular: Negative for chest pain, palpitations and orthopnea.  Gastrointestinal: Negative for heartburn, nausea and vomiting.  Genitourinary: Negative for dysuria, frequency and urgency.  Musculoskeletal: Positive for joint pain.  Neurological: Negative for dizziness, weakness and headaches.    Objective:  Physical Exam: Well nourished and well developed.  General: Alert and oriented x3, cooperative and pleasant, no acute distress.  Head: normocephalic, atraumatic, neck supple.  Eyes: EOMI.  Respiratory: breath sounds clear in all fields, no wheezing, rales, or rhonchi. Cardiovascular: Regular rate and rhythm, no murmurs, gallops or rubs.  Abdomen: non-tender to palpation and soft, normoactive bowel sounds. Musculoskeletal:  Left Knee Exam: No effusion. Tenderness along the medial joint line.  No lateral joint line tenderness. Range of motion is from 5 to 120 degrees.  Moderate crepitus with range of motion. No instability.  Calves soft and nontender. Motor function intact in LE. Strength 5/5 LE bilaterally. Neuro: Distal pulses 2+. Sensation to light touch intact in LE.  Vital signs in last 24 hours: Blood pressure: 132/88 mmHg  Imaging  Review Plain radiographs demonstrate severe degenerative joint disease of the left knee. The overall alignment is neutral. The bone quality appears to be adequate for age and reported activity level.  Assessment/Plan:  End stage arthritis, left knee   The patient history, physical examination, clinical judgment of the provider and imaging studies are consistent with end stage degenerative joint disease of the left knee and total knee arthroplasty is deemed medically necessary. The treatment options including medical management, injection therapy arthroscopy and arthroplasty were discussed at length. The risks and benefits of total knee arthroplasty were presented and reviewed. The risks due to aseptic loosening, infection, stiffness, patella tracking problems, thromboembolic complications and other imponderables were discussed. The patient acknowledged the explanation, agreed to proceed with the plan and consent was signed. Patient is being admitted for inpatient treatment for surgery, pain control, PT, OT, prophylactic antibiotics, VTE prophylaxis, progressive ambulation and ADLs and discharge planning. The patient is planning to be discharged  home .   Patient's anticipated LOS is less than 2 midnights, meeting these requirements: - Younger than 21 - Lives within 1 hour of care - Has a competent adult at home to recover with post-op recover - NO history of  - Chronic pain requiring opiods  - Diabetes  - Coronary Artery Disease  - Heart failure  - Heart attack  - Stroke  - DVT/VTE  - Cardiac arrhythmia  - Respiratory Failure/COPD  - Renal failure  - Anemia  - Advanced Liver disease Therapy Plans: Outpatient therapy at [facility] Disposition: Home with daughter Planned DVT Prophylaxis: Xarelto 10 mg QD (cannot take NSAIDs due to hx of interstitial nephritis) DME Needed: Gilford Rile PCP: Leighton Ruff, MD (appointment on 4/13) TXA: IV Allergies: Sulfa (rash) Anesthesia Concerns:  None BMI: 34.5 Last HgbA1c: HgbA1c 5.8% Other: SDD  - Patient was instructed on what medications to stop prior to surgery. - Follow-up visit in 2 weeks with Dr. Wynelle Link - Begin  physical therapy following surgery - Pre-operative lab work as pre-surgical testing - Prescriptions will be provided in hospital at time of discharge  Theresa Duty, PA-C Orthopedic Surgery [facility] [place]

## 2020-02-11 ENCOUNTER — Encounter: Payer: Self-pay | Admitting: Nurse Practitioner

## 2020-02-11 DIAGNOSIS — Z01818 Encounter for other preprocedural examination: Secondary | ICD-10-CM | POA: Diagnosis not present

## 2020-02-11 DIAGNOSIS — M1712 Unilateral primary osteoarthritis, left knee: Secondary | ICD-10-CM | POA: Diagnosis not present

## 2020-02-11 DIAGNOSIS — E559 Vitamin D deficiency, unspecified: Secondary | ICD-10-CM | POA: Diagnosis not present

## 2020-02-11 DIAGNOSIS — R7303 Prediabetes: Secondary | ICD-10-CM | POA: Diagnosis not present

## 2020-02-11 DIAGNOSIS — I10 Essential (primary) hypertension: Secondary | ICD-10-CM | POA: Diagnosis not present

## 2020-02-11 DIAGNOSIS — M1711 Unilateral primary osteoarthritis, right knee: Secondary | ICD-10-CM | POA: Diagnosis not present

## 2020-02-11 DIAGNOSIS — E7800 Pure hypercholesterolemia, unspecified: Secondary | ICD-10-CM | POA: Diagnosis not present

## 2020-02-11 DIAGNOSIS — I700 Atherosclerosis of aorta: Secondary | ICD-10-CM | POA: Diagnosis not present

## 2020-02-13 ENCOUNTER — Telehealth: Payer: Self-pay | Admitting: *Deleted

## 2020-02-13 NOTE — Telephone Encounter (Signed)
Our office received notes from Dr. Drema Dallas with [facility] stating pt is needing cardiac clearance for her surgery with Dr. Wynelle Link with [facility]. I then called [facility] and s/w receptionist who reviewed pt's chart and saw they forgot to send the clearance request to cardiology. She is faxing over today clearance request to [phone]. Once clearance request is received I will input for Pre Op Team to review. The pt will need an appt as she has not been seen since 09/2018, which I did inform Dr. Anne Fu office of appt will be needed.

## 2020-02-13 NOTE — Telephone Encounter (Signed)
   [facility] Pre-operative Risk Assessment    Request for surgical clearance:  1. What type of surgery is being performed? LEFT TOTAL KNEE ARTHROPLASTY   2. When is this surgery scheduled? 03/09/20   3. What type of clearance is required (medical clearance vs. Pharmacy clearance to hold med vs. Both)? MEDICAL  4. Are there any medications that need to be held prior to surgery and how long? NO MEDICATIONS LISTED   5. Practice name and name of physician performing surgery? [facility]; DR. FRANK ALUISIO   6. What is your office phone number [phone] ATTN: [facility]    7.   What is your office fax number [phone] ATTN: [facility]  8.   Anesthesia type (None, local, MAC, general) ? CHOICE   Julaine Hua 02/13/2020, 4:22 PM  _________________________________________________________________   (provider comments below)

## 2020-02-13 NOTE — Telephone Encounter (Signed)
Patient already has an appointment for pre-op evaluation scheduled for 02/18/2020 with Kathyrn Drown, NP. Will route note to Sharee Pimple so that she is aware and remove from pre-op pool.

## 2020-02-17 ENCOUNTER — Ambulatory Visit (INDEPENDENT_AMBULATORY_CARE_PROVIDER_SITE_OTHER): Payer: 59 | Admitting: Nurse Practitioner

## 2020-02-17 ENCOUNTER — Other Ambulatory Visit: Payer: Self-pay

## 2020-02-17 ENCOUNTER — Encounter: Payer: Self-pay | Admitting: Nurse Practitioner

## 2020-02-17 VITALS — BP 112/64 | HR 80 | Ht 63.0 in | Wt 191.8 lb

## 2020-02-17 DIAGNOSIS — Z01810 Encounter for preprocedural cardiovascular examination: Secondary | ICD-10-CM

## 2020-02-17 DIAGNOSIS — I10 Essential (primary) hypertension: Secondary | ICD-10-CM | POA: Diagnosis not present

## 2020-02-17 DIAGNOSIS — Z7189 Other specified counseling: Secondary | ICD-10-CM

## 2020-02-17 DIAGNOSIS — E785 Hyperlipidemia, unspecified: Secondary | ICD-10-CM | POA: Diagnosis not present

## 2020-02-17 DIAGNOSIS — R7989 Other specified abnormal findings of blood chemistry: Secondary | ICD-10-CM | POA: Diagnosis not present

## 2020-02-17 NOTE — Patient Instructions (Addendum)
After Visit Summary:  We will be checking the following labs today - NONE   Medication Instructions:    Continue with your current medicines.    If you need a refill on your cardiac medications before your next appointment, please call your pharmacy.     Testing/Procedures To Be Arranged:  N/A  Follow-Up:   See [name] back as needed.     At [facility], you and your health needs are our priority.  As part of our continuing mission to provide you with exceptional heart care, we have created designated Provider Care Teams.  These Care Teams include your primary Cardiologist (physician) and Advanced Practice Providers (APPs -  Physician Assistants and Nurse Practitioners) who all work together to provide you with the care you need, when you need it.  Special Instructions:  . Stay safe, stay home, wash your hands for at least 20 seconds and wear a mask when out in public.  . It was good to talk with you today.  . I will send a note to your surgeon.    Call the [place] office at [phone] if you have any questions, problems or concerns.

## 2020-02-17 NOTE — Telephone Encounter (Signed)
Our office received another clearance request. Pt was seen today by Truitt Merle, NP who has cleared the pt for her surgery with Dr. Wynelle Link. I have faxed over to Dr. Anne Fu office today's office note as well as the clearance request.

## 2020-02-17 NOTE — Progress Notes (Signed)
CARDIOLOGY OFFICE NOTE  Date:  02/17/2020    Morgan Williams Date of Birth: 31-Mar-1956 Medical Record [id number]  PCP:  Leighton Ruff, MD  Cardiologist:  Johnsie Cancel  Chief Complaint  Patient presents with  . Pre-op Exam    Seen for Dr. Johnsie Cancel    History of Present Illness: Morgan Williams is a 64 y.o. female who presents today for a pre op clearance visit. Seen for Dr. Johnsie Cancel.   She was first seen back in November of 2018 regarding aortic atherosclerosis. Noted on prior CT that was done for abdominal pain. Has HTN, HLD and DM2 along with asthma and Graves disease. Remote smoker. Works in the [employer]. Echo stable in 2018 along with [employer]. Was to come back prn.   Last seen in December of 2019.   The patient does not have symptoms concerning for COVID-19 infection (fever, chills, cough, or new shortness of breath).   Comes in today. Here alone. She is doing well. Planning knee replacement in May. No chest pain. Breathing is good. Not dizzy and no syncope. She is walking 2 miles about 4 to 5 times a week without issue. She joined the [employer] [facility] program and has been very successful with weight loss.   Past Medical History:  Diagnosis Date  . Allergy   . Anxiety   . Aortic atherosclerosis ([place])   . Arthritis   . Asthma   . Depression   . Eczema   . Fatty liver   . GERD (gastroesophageal reflux disease)   . Glaucoma   . Graves disease   . Heart murmur   . Hyperlipidemia   . Hypertension   . Hypertensive retinopathy   . Insomnia   . Kidney problem   . Obesity   . Osteoarthritis   . Osteopenia   . Prediabetes   . Rosacea   . Thyroid disease   . Vitamin D deficiency   . Vocal cord nodules     Past Surgical History:  Procedure Laterality Date  . CESAREAN SECTION     x 1  . MICROLARYNGOSCOPY Left 11/06/2017   Procedure: MICRO DIRECT LARYNGOSCOPY REMOVAL OF VOCAL CORD;  Surgeon: Jodi Marble, MD;  Location: [facility];  Service: ENT;  Laterality: Left;  . THYROIDECTOMY, PARTIAL  1975   due to Grave's disease     Medications: Current Meds  Medication Sig  . acetaminophen (TYLENOL) 500 MG tablet Take 500 mg by mouth every 6 (six) hours as needed for moderate pain.   Marland Kitchen albuterol (VENTOLIN HFA) 108 (90 Base) MCG/ACT inhaler Inhale 1 puff into the lungs every 6 (six) hours as needed for wheezing or shortness of breath.  . ALPRAZolam (XANAX) 0.5 MG tablet Take 0.5 mg by mouth at bedtime as needed for anxiety.  . cetirizine (ZYRTEC) 10 MG tablet Take 10 mg by mouth daily. At bedtime  . Cholecalciferol 100 MCG (4000 UT) CAPS Take 1 capsule by mouth daily.  . famotidine (PEPCID) 20 MG tablet Take 20 mg by mouth 2 (two) times daily.  . hydrochlorothiazide (MICROZIDE) 12.5 MG capsule Take 12.5 mg by mouth daily.  Marland Kitchen levothyroxine (SYNTHROID) 88 MCG tablet Take 1 tablet (88 mcg total) by mouth daily before breakfast.  . losartan (COZAAR) 50 MG tablet Take 50 mg by mouth daily.  . metFORMIN (GLUCOPHAGE) 500 MG tablet Take 1 tablet (500 mg total) by mouth daily with supper.  . methocarbamol (ROBAXIN) 500 MG tablet Take 500  mg by mouth at bedtime as needed for muscle spasms.   . metroNIDAZOLE (METROCREAM) 0.75 % cream Apply 1 application topically daily.   Marland Kitchen METRONIDAZOLE, TOPICAL, 0.75 % LOTN Apply 1 application topically daily.  . Multiple Vitamins-Minerals (MULTIVITAMIN WITH MINERALS) tablet Take 1 tablet by mouth daily.  . simvastatin (ZOCOR) 40 MG tablet Take 40 mg by mouth daily.     Allergies: Allergies  Allergen Reactions  . Sulfa Antibiotics Rash    Social History: The patient  reports that she quit smoking about 21 years ago. Her smoking use included cigarettes. She quit after 25.00 years of use. She has never used smokeless tobacco. She reports current alcohol use of about 2.0 standard drinks of alcohol per week. She reports that she does not use drugs.   Family History: The  patient's family history includes Colon cancer (age of onset: 70) in her father; Diabetes in her mother; Heart disease in her father; Hyperlipidemia in her mother; Hypertension in her father and mother; Kidney disease in her father; Obesity in her father; Sleep apnea in her father; Thyroid disease in her daughter and mother.   Review of Systems: Please see the history of present illness.   All other systems are reviewed and negative.   Physical Exam: VS:  BP 112/64   Pulse 80   Ht 5\' 3"  (1.6 m)   Wt 191 lb 12.8 oz (87 kg)   LMP 11/01/2003 (Approximate)   SpO2 97%   BMI 33.98 kg/m  .  BMI Body mass index is 33.98 kg/m.  Wt Readings from Last 3 Encounters:  02/17/20 191 lb 12.8 oz (87 kg)  02/06/20 188 lb (85.3 kg)  01/14/20 190 lb (86.2 kg)    General: Pleasant. Alert and in no acute distress. Her weight is down from 213 since last visit here in 2019.   Neck: Supple, no JVD, carotid bruits, or masses noted.  Cardiac: Regular rate and rhythm. No murmurs, rubs, or gallops. No edema.  Respiratory:  Lungs are clear to auscultation bilaterally with normal work of breathing.  GI: Soft and nontender.  MS: No deformity or atrophy. Gait and ROM intact.  Skin: Warm and dry. Color is normal.  Neuro:  Strength and sensation are intact and no gross focal deficits noted.  Psych: Alert, appropriate and with normal affect.   LABORATORY DATA:  EKG:  EKG is ordered today.  Personally reviewed by me. This demonstrates NSR with anterolateral ST and T wave changes - HR is 75 - tracing is unchanged.  Lab Results  Component Value Date   WBC 10.8 09/20/2018   HGB 13.2 09/20/2018   HCT 38.8 09/20/2018   PLT 243 09/20/2018   GLUCOSE 93 09/05/2019   CHOL 144 09/05/2019   TRIG 121 09/05/2019   HDL 41 09/05/2019   LDLCALC 81 09/05/2019   ALT 10 09/05/2019   AST 14 09/05/2019   NA 140 09/05/2019   K 3.9 09/05/2019   CL 103 09/05/2019   CREATININE 0.78 09/05/2019   BUN 22 09/05/2019   CO2 25  09/05/2019   TSH 1.490 05/02/2019   HGBA1C 5.6 09/05/2019       BNP (last 3 results) No results for input(s): BNP in the last 8760 hours.  ProBNP (last 3 results) No results for input(s): PROBNP in the last 8760 hours.   Other Studies Reviewed Today:  Echo Study Conclusions 08/2017  - Left ventricle: The cavity size was normal. Systolic function was  normal. The estimated ejection fraction  was in the range of 60%  to 65%. Wall motion was normal; there were no regional wall  motion abnormalities. Left ventricular diastolic function  parameters were normal.  - Atrial septum: No defect or patent foramen ovale was identified.   Assessment/Plan:  1. Pre op clearance - ok to proceed with knee replacement - felt to be an acceptable candidate - can do over 4 mets of activity and has no worrisome symptoms. EKG is chronically abnormal and unchanged. Will be available as needed.   2. Atherosclerosis - managed on statin therapy - losing weight.   3. HLD - on statin therapy.   4. HTN - BP is great - no changes made today.   5. Prior smoker - resolved.   6. History of murmur - no valve disease on prior echo - not appreciated today.   7. COVID-19 Education: The signs and symptoms of COVID-19 were discussed with the patient and how to seek care for testing (follow up with PCP or arrange E-visit).  The importance of social distancing, staying at home, hand hygiene and wearing a mask when out in public were discussed today.  Current medicines are reviewed with the patient today.  The patient does not have concerns regarding medicines other than what has been noted above.  The following changes have been made:  See above.  Labs/ tests ordered today include:    Orders Placed This Encounter  Procedures  . EKG 12-Lead     Disposition:   FU with [name] prn.   Patient is agreeable to this plan and will call if any problems develop in the interim.   SignedTruitt Merle, NP   02/17/2020 3:26 PM  [facility] [address] [place], [place]  [address] Phone: [phone] Fax: [phone]

## 2020-02-18 ENCOUNTER — Ambulatory Visit: Payer: 59 | Admitting: Cardiology

## 2020-02-19 ENCOUNTER — Ambulatory Visit: Payer: 59 | Admitting: Physician Assistant

## 2020-02-27 NOTE — Patient Instructions (Addendum)
DUE TO COVID-19 ONLY ONE VISITOR IS ALLOWED TO COME WITH YOU AND STAY IN THE WAITING ROOM ONLY DURING PRE OP AND PROCEDURE DAY OF SURGERY. THE 2 VISITORS MAY VISIT WITH YOU AFTER SURGERY IN YOUR PRIVATE ROOM DURING VISITING HOURS ONLY!  YOU NEED TO HAVE A COVID 19 TEST ON__5/6/21_____ @_9 :45______,You can get your covid test after you see Dr. Randa Spike even if it is after 9:45   THIS TEST MUST BE DONE BEFORE SURGERY, COME  [place], [place] , [id number].  ([place]) ONCE YOUR COVID TEST IS COMPLETED, PLEASE BEGIN THE QUARANTINE INSTRUCTIONS AS OUTLINED IN YOUR HANDOUT.                Morgan Williams    Your procedure is scheduled on: 03/09/20   Report to [facility] Main  Entrance   Report to admitting at   8:00 AM     Call this number if you have problems the morning of surgery [phone]    Remember: Do not eat food or drink liquids :After Midnight. BRUSH YOUR TEETH MORNING OF SURGERY AND RINSE YOUR MOUTH OUT, NO CHEWING GUM CANDY OR MINTS.   Do not eat food After Midnight.   YOU MAY HAVE CLEAR LIQUIDS FROM MIDNIGHT UNTIL 7:30 AM.   At 7:30 AM Please finish the prescribed Pre-Surgery Gatorade drink.   Nothing by mouth after you finish the Gatorade drink !   Take these medicines the morning of surgery with A SIP OF WATER: Zyrtec, Levothyroxine, pepcid use your inhalers and bring them with you to the hospital  DO NOT [place]        How to Manage Your Diabetes Before and After Surgery  Why is it important to control my blood sugar before and after surgery? . Improving blood sugar levels before and after surgery helps healing and can limit problems. . A way of improving blood sugar control is eating a healthy diet by: o  Eating less sugar and carbohydrates o  Increasing activity/exercise o  Talking with your doctor about reaching your blood sugar goals . High blood sugars (greater than 180 mg/dL)  can raise your risk of infections and slow your recovery, so you will need to focus on controlling your diabetes during the weeks before surgery. . Make sure that the doctor who takes care of your diabetes knows about your planned surgery including the date and location.  How do I manage my blood sugar before surgery? . Check your blood sugar at least 4 times a day, starting 2 days before surgery, to make sure that the level is not too high or low. o Check your blood sugar the morning of your surgery when you wake up and every 2 hours until you get to the Short Stay unit. . If your blood sugar is less than 70 mg/dL, you will need to treat for low blood sugar: o Do not take insulin. o Treat a low blood sugar (less than 70 mg/dL) with  cup of clear juice (cranberry or apple), 4 glucose tablets, OR glucose gel. o Recheck blood sugar in 15 minutes after treatment (to make sure it is greater than 70 mg/dL). If your blood sugar is not greater than 70 mg/dL on recheck, call [phone] for further instructions. . Report your blood sugar to the short stay nurse when you get to Short Stay.  . If you are admitted to the hospital after surgery: o  Your blood sugar will be checked by the staff and you will probably be given insulin after surgery (instead of oral diabetes medicines) to make sure you have good blood sugar levels. o The goal for blood sugar control after surgery is 80-180 mg/dL.   WHAT DO I DO ABOUT MY DIABETES MEDICATION?  Marland Kitchen Do not take oral diabetes medicines (pills) the morning of surgery.  . If your CBG is greater than 220 mg/dL, you may take  of your sliding scale  . (correction) dose of insulin.                       You may not have any metal on your body including hair pins and              piercings  Do not wear jewelry, make-up, lotions, powders or perfumes, deodorant             Do not wear nail polish on your fingernails.  Do not shave  48 hours prior to surgery.                  Do not bring valuables to the hospital. [place].  Contacts, dentures or bridgework may not be worn into surgery.      Patients discharged the day of surgery will not be allowed to drive home.  IF YOU ARE HAVING SURGERY AND GOING HOME THE SAME DAY, YOU MUST HAVE AN ADULT TO DRIVE YOU HOME AND BE WITH YOU FOR 24 HOURS.  YOU MAY GO HOME BY TAXI OR UBER OR ORTHERWISE, BUT AN ADULT MUST ACCOMPANY YOU HOME AND STAY WITH YOU FOR 24 HOURS.  Name and phone number of your driver:  Special Instructions: N/A              Please read over the following fact sheets you were given: _____________________________________________________________________             [facility] - Preparing for Surgery Before surgery, you can play an important role.   Because skin is not sterile, your skin needs to be as free of germs as possible.   You can reduce the number of germs on your skin by washing with CHG (chlorahexidine gluconate) soap before surgery.   CHG is an antiseptic cleaner which kills germs and bonds with the skin to continue killing germs even after washing. Please DO NOT use if you have an allergy to CHG or antibacterial soaps.   If your skin becomes reddened/irritated stop using the CHG and inform your nurse when you arrive at Short Stay. Do not shave (including legs and underarms) for at least 48 hours prior to the first CHG shower.   Please follow these instructions carefully:  1.  Shower with CHG Soap the night before surgery and the  morning of Surgery.  2.  If you choose to wash your hair, wash your hair first as usual with your  normal  shampoo.  3.  After you shampoo, rinse your hair and body thoroughly to remove the  shampoo.                                        4.  Use CHG as you would any other liquid soap.  You can  apply chg directly  to the skin and wash                       Gently with a scrungie or clean washcloth.  5.   Apply the CHG Soap to your body ONLY FROM THE NECK DOWN.   Do not use on face/ open                           Wound or open sores. Avoid contact with eyes, ears mouth and genitals (private parts).                       Wash face,  Genitals (private parts) with your normal soap.             6.  Wash thoroughly, paying special attention to the area where your surgery  will be performed.  7.  Thoroughly rinse your body with warm water from the neck down.  8.  DO NOT shower/wash with your normal soap after using and rinsing off  the CHG Soap.             9.  Pat yourself dry with a clean towel.            10.  Wear clean pajamas.            11.  Place clean sheets on your bed the night of your first shower and do not  sleep with pets. Day of Surgery : Do not apply any lotions/deodorants the morning of surgery.  Please wear clean clothes to the hospital/surgery center.  FAILURE TO FOLLOW THESE INSTRUCTIONS MAY RESULT IN THE CANCELLATION OF YOUR SURGERY PATIENT SIGNATURE_________________________________  NURSE SIGNATURE__________________________________  ________________________________________________________________________   Morgan Williams  An incentive spirometer is a tool that can help keep your lungs clear and active. This tool measures how well you are filling your lungs with each breath. Taking long deep breaths may help reverse or decrease the chance of developing breathing (pulmonary) problems (especially infection) following:  A long period of time when you are unable to move or be active. BEFORE THE PROCEDURE   If the spirometer includes an indicator to show your best effort, your nurse or respiratory therapist will set it to a desired goal.  If possible, sit up straight or lean slightly forward. Try not to slouch.  Hold the incentive spirometer in an upright position. INSTRUCTIONS FOR USE  1. Sit on the edge of your bed if possible, or sit up as far as you can in bed or  on a chair. 2. Hold the incentive spirometer in an upright position. 3. Breathe out normally. 4. Place the mouthpiece in your mouth and seal your lips tightly around it. 5. Breathe in slowly and as deeply as possible, raising the piston or the ball toward the top of the column. 6. Hold your breath for 3-5 seconds or for as long as possible. Allow the piston or ball to fall to the bottom of the column. 7. Remove the mouthpiece from your mouth and breathe out normally. 8. Rest for a few seconds and repeat Steps 1 through 7 at least 10 times every 1-2 hours when you are awake. Take your time and take a few normal breaths between deep breaths. 9. The spirometer may include an indicator to show your best effort. Use the indicator as a goal to work toward during each repetition. 10.  After each set of 10 deep breaths, practice coughing to be sure your lungs are clear. If you have an incision (the cut made at the time of surgery), support your incision when coughing by placing a pillow or rolled up towels firmly against it. Once you are able to get out of bed, walk around indoors and cough well. You may stop using the incentive spirometer when instructed by your caregiver.  RISKS AND COMPLICATIONS  Take your time so you do not get dizzy or light-headed.  If you are in pain, you may need to take or ask for pain medication before doing incentive spirometry. It is harder to take a deep breath if you are having pain. AFTER USE  Rest and breathe slowly and easily.  It can be helpful to keep track of a log of your progress. Your caregiver can provide you with a simple table to help with this. If you are using the spirometer at home, follow these instructions: [place] IF:   You are having difficultly using the spirometer.  You have trouble using the spirometer as often as instructed.  Your pain medication is not giving enough relief while using the spirometer.  You develop fever of 100.5 F  (38.1 C) or higher. SEEK IMMEDIATE MEDICAL CARE IF:   You cough up bloody sputum that had not been present before.  You develop fever of 102 F (38.9 C) or greater.  You develop worsening pain at or near the incision site. MAKE SURE YOU:   Understand these instructions.  Will watch your condition.  Will get help right away if you are not doing well or get worse. Document Released: 02/27/2007 Document Revised: 01/09/2012 Document Reviewed: 04/30/2007 ExitCare Patient Information 2014 ExitCare, [place].   ________________________________________________________________________  WHAT IS A BLOOD TRANSFUSION? Blood Transfusion Information  A transfusion is the replacement of blood or some of its parts. Blood is made up of multiple cells which provide different functions.  Red blood cells carry oxygen and are used for blood loss replacement.  White blood cells fight against infection.  Platelets control bleeding.  Plasma helps clot blood.  Other blood products are available for specialized needs, such as hemophilia or other clotting disorders. BEFORE THE TRANSFUSION  Who gives blood for transfusions?   Healthy volunteers who are fully evaluated to make sure their blood is safe. This is blood bank blood. Transfusion therapy is the safest it has ever been in the practice of medicine. Before blood is taken from a donor, a complete history is taken to make sure that person has no history of diseases nor engages in risky social behavior (examples are intravenous drug use or sexual activity with multiple partners). The donor's travel history is screened to minimize risk of transmitting infections, such as malaria. The donated blood is tested for signs of infectious diseases, such as HIV and hepatitis. The blood is then tested to be sure it is compatible with you in order to minimize the chance of a transfusion reaction. If you or a relative donates blood, this is often done in anticipation  of surgery and is not appropriate for emergency situations. It takes many days to process the donated blood. RISKS AND COMPLICATIONS Although transfusion therapy is very safe and saves many lives, the main dangers of transfusion include:   Getting an infectious disease.  Developing a transfusion reaction. This is an allergic reaction to something in the blood you were given. Every precaution is taken to prevent this. The decision  to have a blood transfusion has been considered carefully by your caregiver before blood is given. Blood is not given unless the benefits outweigh the risks. AFTER THE TRANSFUSION  Right after receiving a blood transfusion, you will usually feel much better and more energetic. This is especially true if your red blood cells have gotten low (anemic). The transfusion raises the level of the red blood cells which carry oxygen, and this usually causes an energy increase.  The nurse administering the transfusion will monitor you carefully for complications. HOME CARE INSTRUCTIONS  No special instructions are needed after a transfusion. You may find your energy is better. Speak with your caregiver about any limitations on activity for underlying diseases you may have. SEEK MEDICAL CARE IF:   Your condition is not improving after your transfusion.  You develop redness or irritation at the intravenous (IV) site. SEEK IMMEDIATE MEDICAL CARE IF:  Any of the following symptoms occur over the next 12 hours:  Shaking chills.  You have a temperature by mouth above 102 F (38.9 C), not controlled by medicine.  Chest, back, or muscle pain.  People around you feel you are not acting correctly or are confused.  Shortness of breath or difficulty breathing.  Dizziness and fainting.  You get a rash or develop hives.  You have a decrease in urine output.  Your urine turns a dark color or changes to pink, red, or brown. Any of the following symptoms occur over the next 10  days:  You have a temperature by mouth above 102 F (38.9 C), not controlled by medicine.  Shortness of breath.  Weakness after normal activity.  The white part of the eye turns yellow (jaundice).  You have a decrease in the amount of urine or are urinating less often.  Your urine turns a dark color or changes to pink, red, or brown. Document Released: 10/14/2000 Document Revised: 01/09/2012 Document Reviewed: 06/02/2008 [facility] Patient Information 2014 [place], [place].  _______________________________________________________________________

## 2020-02-28 ENCOUNTER — Encounter (HOSPITAL_COMMUNITY)
Admission: RE | Admit: 2020-02-28 | Discharge: 2020-02-28 | Disposition: A | Discharge status: Home / Self Care | Payer: 59 | Source: Ambulatory Visit | Attending: Orthopedic Surgery | Admitting: Orthopedic Surgery

## 2020-02-28 ENCOUNTER — Encounter (HOSPITAL_COMMUNITY): Payer: Self-pay

## 2020-02-28 ENCOUNTER — Other Ambulatory Visit: Payer: Self-pay

## 2020-02-28 DIAGNOSIS — K219 Gastro-esophageal reflux disease without esophagitis: Secondary | ICD-10-CM | POA: Insufficient documentation

## 2020-02-28 DIAGNOSIS — Z6834 Body mass index (BMI) 34.0-34.9, adult: Secondary | ICD-10-CM | POA: Insufficient documentation

## 2020-02-28 DIAGNOSIS — Z79890 Hormone replacement therapy (postmenopausal): Secondary | ICD-10-CM | POA: Insufficient documentation

## 2020-02-28 DIAGNOSIS — E669 Obesity, unspecified: Secondary | ICD-10-CM | POA: Diagnosis not present

## 2020-02-28 DIAGNOSIS — Z01818 Encounter for other preprocedural examination: Secondary | ICD-10-CM | POA: Insufficient documentation

## 2020-02-28 DIAGNOSIS — E119 Type 2 diabetes mellitus without complications: Secondary | ICD-10-CM | POA: Insufficient documentation

## 2020-02-28 DIAGNOSIS — F419 Anxiety disorder, unspecified: Secondary | ICD-10-CM | POA: Diagnosis not present

## 2020-02-28 DIAGNOSIS — E785 Hyperlipidemia, unspecified: Secondary | ICD-10-CM | POA: Diagnosis not present

## 2020-02-28 DIAGNOSIS — M1712 Unilateral primary osteoarthritis, left knee: Secondary | ICD-10-CM | POA: Insufficient documentation

## 2020-02-28 DIAGNOSIS — E890 Postprocedural hypothyroidism: Secondary | ICD-10-CM | POA: Insufficient documentation

## 2020-02-28 DIAGNOSIS — Z87891 Personal history of nicotine dependence: Secondary | ICD-10-CM | POA: Insufficient documentation

## 2020-02-28 DIAGNOSIS — Z7984 Long term (current) use of oral hypoglycemic drugs: Secondary | ICD-10-CM | POA: Insufficient documentation

## 2020-02-28 DIAGNOSIS — J45909 Unspecified asthma, uncomplicated: Secondary | ICD-10-CM | POA: Diagnosis not present

## 2020-02-28 DIAGNOSIS — F329 Major depressive disorder, single episode, unspecified: Secondary | ICD-10-CM | POA: Insufficient documentation

## 2020-02-28 DIAGNOSIS — Z79899 Other long term (current) drug therapy: Secondary | ICD-10-CM | POA: Insufficient documentation

## 2020-02-28 DIAGNOSIS — I10 Essential (primary) hypertension: Secondary | ICD-10-CM | POA: Diagnosis not present

## 2020-02-28 HISTORY — DX: Hypothyroidism, unspecified: E03.9

## 2020-02-28 LAB — HEMOGLOBIN A1C
Hgb A1c MFr Bld: 5.7 % — ABNORMAL HIGH (ref 4.8–5.6)
Mean Plasma Glucose: 116.89 mg/dL

## 2020-02-28 LAB — SURGICAL PCR SCREEN
MRSA, PCR: NEGATIVE
Staphylococcus aureus: NEGATIVE

## 2020-02-28 LAB — COMPREHENSIVE METABOLIC PANEL
ALT: 19 U/L (ref 0–44)
AST: 21 U/L (ref 15–41)
Albumin: 4.2 g/dL (ref 3.5–5.0)
Alkaline Phosphatase: 51 U/L (ref 38–126)
Anion gap: 9 (ref 5–15)
BUN: 24 mg/dL — ABNORMAL HIGH (ref 8–23)
CO2: 26 mmol/L (ref 22–32)
Calcium: 9.2 mg/dL (ref 8.9–10.3)
Chloride: 104 mmol/L (ref 98–111)
Creatinine, Ser: 0.69 mg/dL (ref 0.44–1.00)
GFR calc Af Amer: >60 mL/min (ref >60)
GFR calc non Af Amer: >60 mL/min (ref >60)
Glucose, Bld: 101 mg/dL — ABNORMAL HIGH (ref 70–99)
Potassium: 3.7 mmol/L (ref 3.5–5.1)
Sodium: 139 mmol/L (ref 135–145)
Total Bilirubin: 0.8 mg/dL (ref 0.3–1.2)
Total Protein: 6.9 g/dL (ref 6.5–8.1)

## 2020-02-28 LAB — CBC
HCT: 37.8 % (ref 36.0–46.0)
Hemoglobin: 12.2 g/dL (ref 12.0–15.0)
MCH: 30.7 pg (ref 26.0–34.0)
MCHC: 32.3 g/dL (ref 30.0–36.0)
MCV: 95 fL (ref 80.0–100.0)
Platelets: 189 10*3/uL (ref 150–400)
RBC: 3.98 MIL/uL (ref 3.87–5.11)
RDW: 12.9 % (ref 11.5–15.5)
WBC: 7.3 10*3/uL (ref 4.0–10.5)
nRBC: 0 % (ref 0.0–0.2)

## 2020-02-28 LAB — PROTIME-INR
INR: 1 (ref 0.8–1.2)
Prothrombin Time: 12.7 seconds (ref 11.4–15.2)

## 2020-02-28 LAB — ABO/RH: ABO/RH(D): A POS

## 2020-02-28 LAB — GLUCOSE, CAPILLARY: Glucose-Capillary: 97 mg/dL (ref 70–99)

## 2020-02-28 LAB — APTT: aPTT: 26 seconds (ref 24–36)

## 2020-03-02 NOTE — Progress Notes (Signed)
Anesthesia Chart Review   Case: [id number] Date/Time: 03/09/20 1015   Procedure: TOTAL KNEE ARTHROPLASTY (Left Knee)   Anesthesia type: Choice   Pre-op diagnosis: left knee osteoarthritis   Location: [facility] ROOM 10 / [facility]   Surgeons: Gaynelle Arabian, MD      DISCUSSION:64 y.o. former smoker (quit 10/31/98) with h/o HTN, asthma, HLD, GERD, Grave's disease, DM II, left knee OA scheduled for above procedure 03/09/2020 with Dr. Gaynelle Arabian.   Pt last seen by cardiology 02/17/2020 for preoperative evaluation.  Per OV note, "Pre op clearance - ok to proceed with knee replacement - felt to be an acceptable candidate - can do over 4 mets of activity and has no worrisome symptoms. EKG is chronically abnormal and unchanged. Will be available as needed."  Anticipate pt can proceed with planned procedure barring acute status change.   VS: BP 121/77   Pulse 79   Temp 37.2 C (Oral)   Resp 18   Ht 5\' 3"  (1.6 m)   Wt 88.1 kg   LMP 11/01/2003 (Approximate)   SpO2 98%   BMI 34.42 kg/m   PROVIDERS: Leighton Ruff, MD is PCP   Jenkins Rouge, MD is Cardiologist  LABS: Labs reviewed: Acceptable for surgery. (all labs ordered are listed, but only abnormal results are displayed)  Labs Reviewed  GLUCOSE, CAPILLARY  ABO/RH     IMAGES:   EKG: 02/17/2020 Rate 75 bpm SR  CV: Echo 09/22/2017 Study Conclusions   - Left ventricle: The cavity size was normal. Systolic function was  normal. The estimated ejection fraction was in the range of 60%  to 65%. Wall motion was normal; there were no regional wall  motion abnormalities. Left ventricular diastolic function  parameters were normal.  - Atrial septum: No defect or patent foramen ovale was identified.  Past Medical History:  Diagnosis Date  . Allergy   . Anxiety   . Aortic atherosclerosis ([place])   . Arthritis   . Asthma   . Depression   . Eczema   . Fatty liver   . GERD (gastroesophageal reflux disease)   . Glaucoma   .  Heart murmur   . Hyperlipidemia   . Hypertension   . Hypertensive retinopathy   . Hypothyroidism   . Insomnia   . Kidney problem   . Obesity   . Osteoarthritis   . Osteopenia   . Prediabetes   . Rosacea   . Thyroid disease   . Vitamin D deficiency   . Vocal cord nodules     Past Surgical History:  Procedure Laterality Date  . CESAREAN SECTION     x 1  . MICROLARYNGOSCOPY Left 11/06/2017   Procedure: MICRO DIRECT LARYNGOSCOPY REMOVAL OF VOCAL CORD;  Surgeon: Jodi Marble, MD;  Location: [facility];  Service: ENT;  Laterality: Left;  . THYROIDECTOMY, PARTIAL  1975   due to Grave's disease    MEDICATIONS: . acetaminophen (TYLENOL) 500 MG tablet  . albuterol (VENTOLIN HFA) 108 (90 Base) MCG/ACT inhaler  . ALPRAZolam (XANAX) 0.5 MG tablet  . cetirizine (ZYRTEC) 10 MG tablet  . Cholecalciferol 100 MCG (4000 UT) CAPS  . famotidine (PEPCID) 20 MG tablet  . hydrochlorothiazide (MICROZIDE) 12.5 MG capsule  . levothyroxine (SYNTHROID) 88 MCG tablet  . losartan (COZAAR) 50 MG tablet  . metFORMIN (GLUCOPHAGE) 500 MG tablet  . methocarbamol (ROBAXIN) 500 MG tablet  . metroNIDAZOLE (METROCREAM) 0.75 % cream  . METRONIDAZOLE, TOPICAL, 0.75 % LOTN  . Multiple Vitamins-Minerals (MULTIVITAMIN  WITH MINERALS) tablet  . simvastatin (ZOCOR) 40 MG tablet   No current facility-administered medications for this encounter.     Maia Plan [facility] Pre-Surgical Testing [phone] 03/02/20  4:07 PM

## 2020-03-05 ENCOUNTER — Other Ambulatory Visit: Payer: Self-pay

## 2020-03-05 ENCOUNTER — Ambulatory Visit (INDEPENDENT_AMBULATORY_CARE_PROVIDER_SITE_OTHER): Payer: 59 | Admitting: Physician Assistant

## 2020-03-05 ENCOUNTER — Ambulatory Visit: Payer: 59 | Admitting: Cardiology

## 2020-03-05 ENCOUNTER — Other Ambulatory Visit (HOSPITAL_COMMUNITY)
Admission: RE | Admit: 2020-03-05 | Discharge: 2020-03-05 | Disposition: A | Discharge status: Home / Self Care | Payer: 59 | Source: Ambulatory Visit | Attending: Orthopedic Surgery | Admitting: Orthopedic Surgery

## 2020-03-05 ENCOUNTER — Encounter (INDEPENDENT_AMBULATORY_CARE_PROVIDER_SITE_OTHER): Payer: Self-pay | Admitting: Physician Assistant

## 2020-03-05 VITALS — BP 120/76 | HR 65 | Temp 98.3°F | Ht 63.0 in | Wt 189.0 lb

## 2020-03-05 DIAGNOSIS — Z20822 Contact with and (suspected) exposure to covid-19: Secondary | ICD-10-CM | POA: Insufficient documentation

## 2020-03-05 DIAGNOSIS — R7303 Prediabetes: Secondary | ICD-10-CM | POA: Diagnosis not present

## 2020-03-05 DIAGNOSIS — Z9189 Other specified personal risk factors, not elsewhere classified: Secondary | ICD-10-CM

## 2020-03-05 DIAGNOSIS — E559 Vitamin D deficiency, unspecified: Secondary | ICD-10-CM | POA: Diagnosis not present

## 2020-03-05 DIAGNOSIS — Z6833 Body mass index (BMI) 33.0-33.9, adult: Secondary | ICD-10-CM

## 2020-03-05 DIAGNOSIS — E669 Obesity, unspecified: Secondary | ICD-10-CM

## 2020-03-05 DIAGNOSIS — Z01812 Encounter for preprocedural laboratory examination: Secondary | ICD-10-CM | POA: Insufficient documentation

## 2020-03-05 LAB — SARS CORONAVIRUS 2 (TAT 6-24 HRS): SARS Coronavirus 2: NEGATIVE

## 2020-03-05 MED ORDER — METFORMIN HCL 500 MG PO TABS: 500.0000 mg | ORAL_TABLET | Freq: Every day | ORAL | 0 refills | Status: DC

## 2020-03-05 MED FILL — metFORMIN HCL 500 MG TABS: 500 | 30 days supply | Qty: 30 | Fill #0

## 2020-03-05 NOTE — Progress Notes (Signed)
Chief Complaint:   OBESITY Morgan Williams is here to discuss her progress with her obesity treatment plan along with follow-up of her obesity related diagnoses. Morgan Williams is on the Category 2 Plan and states she is following her eating plan approximately 60% of the time. Morgan Williams states she is walking for 30-45 minutes 4 times per week.  Today's visit was #: 42 Starting weight: 219 lbs Starting date: 09/20/2018 Today's weight: 189 lbs Today's date: 03/05/2020 Total lbs lost to date: 30 Total lbs lost since last in-office visit: 0  Interim History: Morgan Williams reports that she was on vacation for 2 weeks, and she did not eat on the plan during that time. She got back on track this week. She has a knee replacement surgery in 5 days.  Subjective:   1. Pre-diabetes Terre denies nausea, vomiting, or diarrhea on metformin. She denies polyphagia.  2. Vitamin D deficiency Morgan Williams is on Vit D, and she denies nausea, vomiting, or muscle weakness. She is walking outside regularly.  3. At risk for diabetes mellitus Morgan Williams is at higher than average risk for developing diabetes due to her obesity.   Assessment/Plan:   1. Pre-diabetes Morgan Williams will continue to work on weight loss, exercise, and decreasing simple carbohydrates to help decrease the risk of diabetes. We will refill metformin for 1 month.  - metFORMIN (GLUCOPHAGE) 500 MG tablet; Take 1 tablet (500 mg total) by mouth daily with supper.  Dispense: 30 tablet; Refill: 0  2. Vitamin D deficiency Low Vitamin D level contributes to fatigue and are associated with obesity, breast, and colon cancer. Morgan Williams agreed to continue taking Vit D 4,000 IU daily and will follow-up for routine testing of Vitamin D, at least 2-3 times per year to avoid over-replacement.  3. At risk for diabetes mellitus Morgan Williams was given approximately 15 minutes of diabetes education and counseling today. We discussed intensive lifestyle modifications today with an emphasis on weight loss as  well as increasing exercise and decreasing simple carbohydrates in her diet. We also reviewed medication options with an emphasis on risk versus benefit of those discussed.   Repetitive spaced learning was employed today to elicit superior memory formation and behavioral change.  4. Class 1 obesity with serious comorbidity and body mass index (BMI) of 33.0 to 33.9 in adult, unspecified obesity type Morgan Williams is currently in the action stage of change. As such, her goal is to continue with weight loss efforts. She has agreed to the Category 2 Plan.   Exercise goals: As is.  Behavioral modification strategies: meal planning and cooking strategies and keeping healthy foods in the home.  Morgan Williams has agreed to follow-up with our clinic in 4 weeks. She was informed of the importance of frequent follow-up visits to maximize her success with intensive lifestyle modifications for her multiple health conditions.   Objective:   Blood pressure 120/76, pulse 65, temperature 98.3 F (36.8 C), temperature source Oral, height 5\' 3"  (1.6 m), weight 189 lb (85.7 kg), last menstrual period 11/01/2003, SpO2 97 %. Body mass index is 33.48 kg/m.  General: Cooperative, alert, well developed, in no acute distress. HEENT: Conjunctivae and lids unremarkable. Cardiovascular: Regular rhythm.  Lungs: Normal work of breathing. Neurologic: No focal deficits.   Lab Results  Component Value Date   CREATININE 0.69 02/28/2020   BUN 24 (H) 02/28/2020   NA 139 02/28/2020   K 3.7 02/28/2020   CL 104 02/28/2020   CO2 26 02/28/2020   Lab Results  Component Value Date  ALT 19 02/28/2020   AST 21 02/28/2020   ALKPHOS 51 02/28/2020   BILITOT 0.8 02/28/2020   Lab Results  Component Value Date   HGBA1C 5.7 (H) 02/28/2020   HGBA1C 5.6 09/05/2019   HGBA1C 5.5 05/02/2019   HGBA1C 6.3 (H) 09/20/2018   Lab Results  Component Value Date   INSULIN 8.3 09/05/2019   INSULIN 11.3 05/02/2019   INSULIN 17.2 09/20/2018    Lab Results  Component Value Date   TSH 1.490 05/02/2019   Lab Results  Component Value Date   CHOL 144 09/05/2019   HDL 41 09/05/2019   LDLCALC 81 09/05/2019   TRIG 121 09/05/2019   CHOLHDL 3.6 09/20/2018   Lab Results  Component Value Date   WBC 7.3 02/28/2020   HGB 12.2 02/28/2020   HCT 37.8 02/28/2020   MCV 95.0 02/28/2020   PLT 189 02/28/2020   No results found for: IRON, TIBC, FERRITIN  Attestation Statements:   Reviewed by clinician on day of visit: allergies, medications, problem list, medical history, surgical history, family history, social history, and previous encounter notes.   Wilhemena Durie, am acting as transcriptionist for [employer], PA-C.  I have reviewed the above documentation for accuracy and completeness, and I agree with the above. Abby Potash, PA-C

## 2020-03-08 MED ORDER — BUPIVACAINE LIPOSOME 1.3 % IJ SUSP
20.0000 mL | Freq: Once | INTRAMUSCULAR | Status: DC
  Filled 2020-03-08: qty 20

## 2020-03-09 ENCOUNTER — Other Ambulatory Visit: Payer: Self-pay

## 2020-03-09 ENCOUNTER — Ambulatory Visit (HOSPITAL_COMMUNITY)
Admission: RE | Admit: 2020-03-09 | Discharge: 2020-03-09 | Disposition: A | Discharge status: Home / Self Care | Payer: 59 | Attending: Orthopedic Surgery | Admitting: Orthopedic Surgery

## 2020-03-09 ENCOUNTER — Encounter (HOSPITAL_COMMUNITY): Payer: Self-pay | Admitting: Orthopedic Surgery

## 2020-03-09 ENCOUNTER — Ambulatory Visit (HOSPITAL_COMMUNITY): Payer: 59 | Admitting: Physician Assistant

## 2020-03-09 ENCOUNTER — Ambulatory Visit (HOSPITAL_COMMUNITY): Payer: 59 | Admitting: Certified Registered Nurse Anesthetist

## 2020-03-09 ENCOUNTER — Encounter (HOSPITAL_COMMUNITY)
Admission: RE | Disposition: A | Discharge status: Home / Self Care | Payer: Self-pay | Source: Home / Self Care | Attending: Orthopedic Surgery

## 2020-03-09 DIAGNOSIS — E119 Type 2 diabetes mellitus without complications: Secondary | ICD-10-CM | POA: Diagnosis not present

## 2020-03-09 DIAGNOSIS — F329 Major depressive disorder, single episode, unspecified: Secondary | ICD-10-CM | POA: Diagnosis not present

## 2020-03-09 DIAGNOSIS — E785 Hyperlipidemia, unspecified: Secondary | ICD-10-CM | POA: Diagnosis not present

## 2020-03-09 DIAGNOSIS — M1712 Unilateral primary osteoarthritis, left knee: Secondary | ICD-10-CM | POA: Diagnosis not present

## 2020-03-09 DIAGNOSIS — Z87891 Personal history of nicotine dependence: Secondary | ICD-10-CM | POA: Insufficient documentation

## 2020-03-09 DIAGNOSIS — E039 Hypothyroidism, unspecified: Secondary | ICD-10-CM | POA: Insufficient documentation

## 2020-03-09 DIAGNOSIS — Z7984 Long term (current) use of oral hypoglycemic drugs: Secondary | ICD-10-CM | POA: Insufficient documentation

## 2020-03-09 DIAGNOSIS — E669 Obesity, unspecified: Secondary | ICD-10-CM | POA: Insufficient documentation

## 2020-03-09 DIAGNOSIS — Z79890 Hormone replacement therapy (postmenopausal): Secondary | ICD-10-CM | POA: Insufficient documentation

## 2020-03-09 DIAGNOSIS — G8918 Other acute postprocedural pain: Secondary | ICD-10-CM | POA: Diagnosis not present

## 2020-03-09 DIAGNOSIS — J449 Chronic obstructive pulmonary disease, unspecified: Secondary | ICD-10-CM | POA: Insufficient documentation

## 2020-03-09 DIAGNOSIS — K219 Gastro-esophageal reflux disease without esophagitis: Secondary | ICD-10-CM | POA: Insufficient documentation

## 2020-03-09 DIAGNOSIS — Z6834 Body mass index (BMI) 34.0-34.9, adult: Secondary | ICD-10-CM | POA: Insufficient documentation

## 2020-03-09 DIAGNOSIS — Z882 Allergy status to sulfonamides status: Secondary | ICD-10-CM | POA: Diagnosis not present

## 2020-03-09 DIAGNOSIS — F419 Anxiety disorder, unspecified: Secondary | ICD-10-CM | POA: Diagnosis not present

## 2020-03-09 DIAGNOSIS — I10 Essential (primary) hypertension: Secondary | ICD-10-CM | POA: Diagnosis not present

## 2020-03-09 DIAGNOSIS — Z79899 Other long term (current) drug therapy: Secondary | ICD-10-CM | POA: Insufficient documentation

## 2020-03-09 DIAGNOSIS — Z96652 Presence of left artificial knee joint: Secondary | ICD-10-CM | POA: Diagnosis not present

## 2020-03-09 HISTORY — PX: TOTAL KNEE ARTHROPLASTY: SHX125

## 2020-03-09 LAB — GLUCOSE, CAPILLARY: Glucose-Capillary: 97 mg/dL (ref 70–99)

## 2020-03-09 LAB — TYPE AND SCREEN
ABO/RH(D): A POS
Antibody Screen: NEGATIVE

## 2020-03-09 SURGERY — ARTHROPLASTY, KNEE, TOTAL
Anesthesia: Spinal | Site: Knee | Laterality: Left

## 2020-03-09 MED ORDER — OXYCODONE HCL 5 MG PO TABS: 5.0000 mg | ORAL_TABLET | Freq: Four times a day (QID) | ORAL | 0 refills | Status: DC | PRN

## 2020-03-09 MED ORDER — EPHEDRINE 5 MG/ML INJ
INTRAVENOUS | Status: AC
  Filled 2020-03-09: qty 10

## 2020-03-09 MED ORDER — SODIUM CHLORIDE (PF) 0.9 % IJ SOLN
INTRAMUSCULAR | Status: DC | PRN
  Administered 2020-03-09: 60 mL

## 2020-03-09 MED ORDER — ONDANSETRON HCL 4 MG/2ML IJ SOLN
INTRAMUSCULAR | Status: DC | PRN
  Administered 2020-03-09: 4 mg via INTRAVENOUS

## 2020-03-09 MED ORDER — RIVAROXABAN 10 MG PO TABS
10.0000 mg | ORAL_TABLET | Freq: Every day | ORAL | 0 refills | Status: DC
Start: 2020-03-09 — End: 2020-05-27

## 2020-03-09 MED ORDER — LIDOCAINE HCL (CARDIAC) PF 100 MG/5ML IV SOSY
PREFILLED_SYRINGE | INTRAVENOUS | Status: DC | PRN
  Administered 2020-03-09: 80 mg via INTRAVENOUS

## 2020-03-09 MED ORDER — MIDAZOLAM HCL 2 MG/2ML IJ SOLN
INTRAMUSCULAR | Status: AC
  Administered 2020-03-09: 2 mg via INTRAVENOUS
  Filled 2020-03-09: qty 2

## 2020-03-09 MED ORDER — METHOCARBAMOL 500 MG IVPB - SIMPLE MED
500.0000 mg | Freq: Four times a day (QID) | INTRAVENOUS | Status: DC | PRN
  Administered 2020-03-09: 500 mg via INTRAVENOUS

## 2020-03-09 MED ORDER — FENTANYL CITRATE (PF) 100 MCG/2ML IJ SOLN
INTRAMUSCULAR | Status: AC
  Administered 2020-03-09: 100 ug via INTRAVENOUS
  Filled 2020-03-09: qty 2

## 2020-03-09 MED ORDER — TRANEXAMIC ACID-NACL 1000-0.7 MG/100ML-% IV SOLN
INTRAVENOUS | Status: AC
  Filled 2020-03-09: qty 100

## 2020-03-09 MED ORDER — TRAMADOL HCL 50 MG PO TABS: 50.0000 mg | ORAL_TABLET | Freq: Four times a day (QID) | ORAL | 0 refills | Status: DC | PRN

## 2020-03-09 MED ORDER — OXYCODONE HCL 5 MG/5ML PO SOLN: 5.0000 mg | Freq: Once | ORAL | Status: AC | PRN

## 2020-03-09 MED ORDER — FENTANYL CITRATE (PF) 100 MCG/2ML IJ SOLN: 50.0000 ug | INTRAMUSCULAR | Status: DC

## 2020-03-09 MED ORDER — CHLORHEXIDINE GLUCONATE 4 % EX LIQD: 60.0000 mL | Freq: Once | CUTANEOUS | Status: DC

## 2020-03-09 MED ORDER — CEFAZOLIN SODIUM-DEXTROSE 2-4 GM/100ML-% IV SOLN: 2.0000 g | Freq: Four times a day (QID) | INTRAVENOUS | Status: DC

## 2020-03-09 MED ORDER — PROPOFOL 10 MG/ML IV BOLUS
INTRAVENOUS | Status: DC | PRN
  Administered 2020-03-09: 20 mg via INTRAVENOUS

## 2020-03-09 MED ORDER — POVIDONE-IODINE 10 % EX SWAB
2.0000 "application " | Freq: Once | CUTANEOUS | Status: AC
  Administered 2020-03-09: 2 via TOPICAL

## 2020-03-09 MED ORDER — DEXAMETHASONE SODIUM PHOSPHATE 10 MG/ML IJ SOLN: 8.0000 mg | Freq: Once | INTRAMUSCULAR | Status: DC

## 2020-03-09 MED ORDER — SODIUM CHLORIDE 0.9 % IR SOLN
Status: DC | PRN
  Administered 2020-03-09: 1000 mL

## 2020-03-09 MED ORDER — MIDAZOLAM HCL 2 MG/2ML IJ SOLN: 1.0000 mg | INTRAMUSCULAR | Status: DC

## 2020-03-09 MED ORDER — PROPOFOL 1000 MG/100ML IV EMUL
INTRAVENOUS | Status: AC
  Filled 2020-03-09: qty 100

## 2020-03-09 MED ORDER — TRANEXAMIC ACID-NACL 1000-0.7 MG/100ML-% IV SOLN
1000.0000 mg | INTRAVENOUS | Status: AC
  Administered 2020-03-09: 1000 mg via INTRAVENOUS

## 2020-03-09 MED ORDER — ACETAMINOPHEN 10 MG/ML IV SOLN
1000.0000 mg | Freq: Four times a day (QID) | INTRAVENOUS | Status: DC
  Administered 2020-03-09: 1000 mg via INTRAVENOUS

## 2020-03-09 MED ORDER — SODIUM CHLORIDE (PF) 0.9 % IJ SOLN
INTRAMUSCULAR | Status: AC
  Filled 2020-03-09: qty 50

## 2020-03-09 MED ORDER — MEPERIDINE HCL 50 MG/ML IJ SOLN: 6.2500 mg | INTRAMUSCULAR | Status: DC | PRN

## 2020-03-09 MED ORDER — MIDAZOLAM HCL 2 MG/2ML IJ SOLN: 0.5000 mg | Freq: Once | INTRAMUSCULAR | Status: DC | PRN

## 2020-03-09 MED ORDER — CEFAZOLIN SODIUM-DEXTROSE 2-4 GM/100ML-% IV SOLN
INTRAVENOUS | Status: AC
  Filled 2020-03-09: qty 100

## 2020-03-09 MED ORDER — LACTATED RINGERS IV SOLN: INTRAVENOUS | Status: DC

## 2020-03-09 MED ORDER — SODIUM CHLORIDE (PF) 0.9 % IJ SOLN
INTRAMUSCULAR | Status: AC
  Filled 2020-03-09: qty 10

## 2020-03-09 MED ORDER — OXYCODONE HCL 5 MG PO TABS
ORAL_TABLET | ORAL | Status: AC
  Administered 2020-03-09: 5 mg via ORAL
  Filled 2020-03-09: qty 1

## 2020-03-09 MED ORDER — EPHEDRINE SULFATE-NACL 50-0.9 MG/10ML-% IV SOSY
PREFILLED_SYRINGE | INTRAVENOUS | Status: DC | PRN
  Administered 2020-03-09: 10 mg via INTRAVENOUS

## 2020-03-09 MED ORDER — LIDOCAINE 2% (20 MG/ML) 5 ML SYRINGE
INTRAMUSCULAR | Status: AC
  Filled 2020-03-09: qty 5

## 2020-03-09 MED ORDER — STERILE WATER FOR INJECTION IJ SOLN
INTRAMUSCULAR | Status: DC | PRN
  Administered 2020-03-09: 1000 mL

## 2020-03-09 MED ORDER — ONDANSETRON HCL 4 MG/2ML IJ SOLN
INTRAMUSCULAR | Status: AC
  Filled 2020-03-09: qty 2

## 2020-03-09 MED ORDER — DEXAMETHASONE SODIUM PHOSPHATE 10 MG/ML IJ SOLN
INTRAMUSCULAR | Status: DC | PRN
  Administered 2020-03-09: 10 mg via INTRAVENOUS

## 2020-03-09 MED ORDER — PHENYLEPHRINE HCL-NACL 10-0.9 MG/250ML-% IV SOLN
INTRAVENOUS | Status: DC | PRN
Start: 2020-03-09 — End: 2020-03-09
  Administered 2020-03-09: 25 ug/min via INTRAVENOUS
  Administered 2020-03-09: 50 ug/min via INTRAVENOUS

## 2020-03-09 MED ORDER — LACTATED RINGERS IV BOLUS
250.0000 mL | Freq: Once | INTRAVENOUS | Status: AC
  Administered 2020-03-09: 250 mL via INTRAVENOUS

## 2020-03-09 MED ORDER — METHOCARBAMOL 500 MG IVPB - SIMPLE MED
INTRAVENOUS | Status: AC
  Filled 2020-03-09: qty 50

## 2020-03-09 MED ORDER — METHOCARBAMOL 500 MG PO TABS: 500.0000 mg | ORAL_TABLET | Freq: Four times a day (QID) | ORAL | Status: DC | PRN

## 2020-03-09 MED ORDER — DEXAMETHASONE SODIUM PHOSPHATE 10 MG/ML IJ SOLN
INTRAMUSCULAR | Status: AC
  Filled 2020-03-09: qty 1

## 2020-03-09 MED ORDER — HYDROMORPHONE HCL 1 MG/ML IJ SOLN: 0.2500 mg | INTRAMUSCULAR | Status: DC | PRN

## 2020-03-09 MED ORDER — METHOCARBAMOL 500 MG PO TABS: 500.0000 mg | ORAL_TABLET | Freq: Four times a day (QID) | ORAL | 0 refills | Status: DC | PRN

## 2020-03-09 MED ORDER — PROMETHAZINE HCL 25 MG/ML IJ SOLN: 6.2500 mg | INTRAMUSCULAR | Status: DC | PRN

## 2020-03-09 MED ORDER — OXYCODONE HCL 5 MG PO TABS: 5.0000 mg | ORAL_TABLET | Freq: Once | ORAL | Status: AC | PRN

## 2020-03-09 MED ORDER — BUPIVACAINE LIPOSOME 1.3 % IJ SUSP
INTRAMUSCULAR | Status: DC | PRN
  Administered 2020-03-09: 20 mL

## 2020-03-09 MED ORDER — CEFAZOLIN SODIUM-DEXTROSE 2-4 GM/100ML-% IV SOLN
2.0000 g | INTRAVENOUS | Status: AC
  Administered 2020-03-09: 2 g via INTRAVENOUS

## 2020-03-09 MED ORDER — ROPIVACAINE HCL 7.5 MG/ML IJ SOLN
INTRAMUSCULAR | Status: DC | PRN
  Administered 2020-03-09: 20 mL via PERINEURAL

## 2020-03-09 MED ORDER — PROPOFOL 500 MG/50ML IV EMUL
INTRAVENOUS | Status: DC | PRN
  Administered 2020-03-09: 100 ug/kg/min via INTRAVENOUS

## 2020-03-09 MED ORDER — ACETAMINOPHEN 10 MG/ML IV SOLN
INTRAVENOUS | Status: AC
  Filled 2020-03-09: qty 100

## 2020-03-09 MED ORDER — MEPIVACAINE HCL (PF) 2 % IJ SOLN
INTRAMUSCULAR | Status: DC | PRN
  Administered 2020-03-09: 60 mg via INTRATHECAL

## 2020-03-09 MED ORDER — MEPIVACAINE HCL (PF) 2 % IJ SOLN
INTRAMUSCULAR | Status: AC
  Filled 2020-03-09: qty 40

## 2020-03-09 MED ORDER — LACTATED RINGERS IV BOLUS: 250.0000 mL | Freq: Once | INTRAVENOUS | Status: DC

## 2020-03-09 MED ORDER — LACTATED RINGERS IV SOLN: INTRAVENOUS | Status: DC | PRN

## 2020-03-09 MED ORDER — GABAPENTIN 300 MG PO CAPS
ORAL_CAPSULE | ORAL | 0 refills | Status: DC
Start: 2020-03-09 — End: 2020-12-01

## 2020-03-09 MED ORDER — LACTATED RINGERS IV BOLUS
500.0000 mL | Freq: Once | INTRAVENOUS | Status: AC
  Administered 2020-03-09: 500 mL via INTRAVENOUS

## 2020-03-09 SURGICAL SUPPLY — 59 items
ATTUNE PSFEM LTSZ5 NARCEM KNEE (Femur) ×1 IMPLANT
ATTUNE PSRP INSE SZ5 7 KNEE (Insert) ×1 IMPLANT
BAG SPEC THK2 15X12 ZIP CLS (MISCELLANEOUS) ×1
BAG ZIPLOCK 12X15 (MISCELLANEOUS) ×2 IMPLANT
BASEPLATE TIBIAL ROTATING SZ 4 (Knees) ×1 IMPLANT
BLADE SAG 18X100X1.27 (BLADE) ×2 IMPLANT
BLADE SAW SGTL 11.0X1.19X90.0M (BLADE) ×2 IMPLANT
BLADE SURG SZ10 CARB STEEL (BLADE) ×4 IMPLANT
BNDG CMPR MED 10X6 ELC LF (GAUZE/BANDAGES/DRESSINGS) ×1
BNDG ELASTIC 6X10 VLCR STRL LF (GAUZE/BANDAGES/DRESSINGS) ×1 IMPLANT
BNDG ELASTIC 6X5.8 VLCR STR LF (GAUZE/BANDAGES/DRESSINGS) ×2 IMPLANT
BOWL SMART MIX CTS (DISPOSABLE) ×2 IMPLANT
BSPLAT TIB 4 CMNT ROT PLAT STR (Knees) ×1 IMPLANT
CEMENT HV SMART SET (Cement) ×4 IMPLANT
CLSR STERI-STRIP ANTIMIC 1/2X4 (GAUZE/BANDAGES/DRESSINGS) ×1 IMPLANT
COVER SURGICAL LIGHT HANDLE (MISCELLANEOUS) ×2 IMPLANT
COVER WAND RF STERILE (DRAPES) IMPLANT
CUFF TOURN SGL QUICK 34 (TOURNIQUET CUFF) ×2
CUFF TRNQT CYL 34X4.125X (TOURNIQUET CUFF) ×1 IMPLANT
DECANTER SPIKE VIAL GLASS SM (MISCELLANEOUS) ×2 IMPLANT
DRAPE U-SHAPE 47X51 STRL (DRAPES) ×2 IMPLANT
DRSG AQUACEL AG ADV 3.5X10 (GAUZE/BANDAGES/DRESSINGS) ×2 IMPLANT
DURAPREP 26ML APPLICATOR (WOUND CARE) ×2 IMPLANT
ELECT REM PT RETURN 15FT ADLT (MISCELLANEOUS) ×2 IMPLANT
EVACUATOR 1/8 PVC DRAIN (DRAIN) IMPLANT
GAUZE SPONGE 2X2 8PLY STRL LF (GAUZE/BANDAGES/DRESSINGS) ×1 IMPLANT
GLOVE BIO SURGEON STRL SZ7 (GLOVE) ×2 IMPLANT
GLOVE BIO SURGEON STRL SZ8 (GLOVE) ×2 IMPLANT
GLOVE BIOGEL PI IND STRL 7.0 (GLOVE) ×1 IMPLANT
GLOVE BIOGEL PI IND STRL 8 (GLOVE) ×1 IMPLANT
GLOVE BIOGEL PI INDICATOR 7.0 (GLOVE) ×1
GLOVE BIOGEL PI INDICATOR 8 (GLOVE) ×1
GOWN STRL REUS W/TWL LRG LVL3 (GOWN DISPOSABLE) ×4 IMPLANT
HANDPIECE INTERPULSE COAX TIP (DISPOSABLE) ×2
HOLDER FOLEY CATH W/STRAP (MISCELLANEOUS) IMPLANT
IMMOBILIZER KNEE 20 (SOFTGOODS) ×2
IMMOBILIZER KNEE 20 THIGH 36 (SOFTGOODS) ×1 IMPLANT
KIT TURNOVER KIT A (KITS) IMPLANT
MANIFOLD NEPTUNE II (INSTRUMENTS) ×2 IMPLANT
NS IRRIG 1000ML POUR BTL (IV SOLUTION) ×2 IMPLANT
PACK TOTAL KNEE CUSTOM (KITS) ×2 IMPLANT
PADDING CAST COTTON 6X4 STRL (CAST SUPPLIES) ×3 IMPLANT
PATELLA MEDIAL ATTUN 35MM KNEE (Knees) ×1 IMPLANT
PENCIL SMOKE EVACUATOR (MISCELLANEOUS) IMPLANT
PIN DRILL FIX HALF THREAD (BIT) ×1 IMPLANT
PIN STEINMAN FIXATION KNEE (PIN) ×1 IMPLANT
PROTECTOR NERVE ULNAR (MISCELLANEOUS) ×2 IMPLANT
SET HNDPC FAN SPRY TIP SCT (DISPOSABLE) ×1 IMPLANT
SPONGE GAUZE 2X2 STER 10/PKG (GAUZE/BANDAGES/DRESSINGS) ×1
STRIP CLOSURE SKIN 1/2X4 (GAUZE/BANDAGES/DRESSINGS) ×4 IMPLANT
SUT MNCRL AB 4-0 PS2 18 (SUTURE) ×2 IMPLANT
SUT STRATAFIX 0 PDS 27 VIOLET (SUTURE) ×2
SUT VIC AB 2-0 CT1 27 (SUTURE) ×6
SUT VIC AB 2-0 CT1 TAPERPNT 27 (SUTURE) ×3 IMPLANT
SUTURE STRATFX 0 PDS 27 VIOLET (SUTURE) ×1 IMPLANT
TRAY FOLEY MTR SLVR 16FR STAT (SET/KITS/TRAYS/PACK) ×2 IMPLANT
WATER STERILE IRR 1000ML POUR (IV SOLUTION) ×4 IMPLANT
WRAP KNEE MAXI GEL POST OP (GAUZE/BANDAGES/DRESSINGS) ×2 IMPLANT
YANKAUER SUCT BULB TIP 10FT TU (MISCELLANEOUS) ×2 IMPLANT

## 2020-03-09 NOTE — Anesthesia Postprocedure Evaluation (Signed)
Anesthesia Post Note  Patient: Morgan Williams  Procedure(s) Performed: TOTAL KNEE ARTHROPLASTY (Left Knee)     Patient location during evaluation: PACU Anesthesia Type: Spinal Level of consciousness: awake and alert, oriented and patient cooperative Pain management: pain level controlled Vital Signs Assessment: post-procedure vital signs reviewed and stable Respiratory status: spontaneous breathing, nonlabored ventilation and respiratory function stable Cardiovascular status: blood pressure returned to baseline and stable Postop Assessment: no apparent nausea or vomiting and spinal receding Anesthetic complications: no    Last Vitals:  Vitals:   03/09/20 1345 03/09/20 1400  BP: 138/81   Pulse: 63 68  Resp: 15 16  Temp:  (!) 36.1 C  SpO2: 97% 98%    Last Pain:  Vitals:   03/09/20 1400  TempSrc:   PainSc: 2                  Quanasia Defino,E. Chrissy Ealey

## 2020-03-09 NOTE — Evaluation (Signed)
Physical Therapy Evaluation Patient Details Name: Morgan Williams MRN: [id number] DOB: 1956-01-08 Today's Date: 03/09/2020   History of Present Illness  Patient is 64 y.o. female s/p Lt TKA on 03/09/20 with PMH significant for thyroid disease, osteopenia, OA, obesity, HTN, HLD, GERD, glaucoma, fatty liver, depression, anxiety, asthma, aortic atherosclerosis.     Clinical Impression  Morgan Williams is a 64 y.o. female POD 0 s/p Lt TKA. Patient reports independence with mobility at baseline. Patient is now limited by functional impairments (see PT problem list below) and requires supervision/min guard for transfers and gait with RW. Patient was able to ambulate ~100 feet with RW and supervision/min guard assist and cues for safe walker management. Patient educated on safe sequencing for stair mobility and verbalized safe guarding position for people assisting with mobility. Patient instructed to wear knee immobilizer home for safety with stair mobility. Patient instructed in exercises to facilitate ROM and circulation. Patient will benefit from continued skilled PT interventions to address impairments and progress towards PLOF. Patient has met mobility goals at adequate level for discharge home; will continue to follow if pt continues acute stay to progress towards Mod I goals.     Follow Up Recommendations Outpatient PT;Follow surgeon's recommendation for DC plan and follow-up therapies    Equipment Recommendations  Rolling walker with 5" wheels;3in1 (PT);Cane(RW and s-in-1 delivered in PACU, daughter getting [facility])    Recommendations for Other Services       Precautions / Restrictions Precautions Precautions: Fall Restrictions Weight Bearing Restrictions: No Other Position/Activity Restrictions: WBAT      Mobility  Bed Mobility Overal bed mobility: Needs Assistance Bed Mobility: Supine to Sit;Sit to Supine     Supine to sit: Supervision Sit to supine: Supervision   General  bed mobility comments: cues for sequencing, pt requires extra time, no assist needed  Transfers Overall transfer level: Needs assistance Equipment used: Rolling walker (2 wheeled) Transfers: Sit to/from Stand Sit to Stand: Min guard         General transfer comment: cues for technique, no assist required for power up, pt steady with rising  Ambulation/Gait Ambulation/Gait assistance: Min guard;Supervision Gait Distance (Feet): 100 Feet Assistive device: Rolling walker (2 wheeled) Gait Pattern/deviations: Step-to pattern;Decreased step length - right;Decreased stance time - left;Decreased stride length;Decreased weight shift to left Gait velocity: decreased   General Gait Details: verbal cues for safe step pattern/sequencing with RW. pt maintained safe proximity throughout. no overt LOB noted.   Stairs Stairs: Yes Stairs assistance: Min guard Stair Management: One rail Right;Step to pattern;Forwards;With cane Number of Stairs: 6(2x3) General stair comments: cues for safe step sequencing "up with good, down with bad". knee immobilizer worn for safety, no overt LOB noted. Pt abe to sequence second set of stairs with no decresaed cues for step pattern. Pt verbalized safe guarding position for daughter to provide on returing home.  Wheelchair Mobility    Modified Rankin (Stroke Patients Only)       Balance Overall balance assessment: Needs assistance Sitting-balance support: Feet supported Sitting balance-Leahy Scale: Good     Standing balance support: During functional activity;Bilateral upper extremity supported Standing balance-Leahy Scale: Fair            Pertinent Vitals/Pain Pain Assessment: 0-10 Pain Score: 6  Pain Location: Lt knee Pain Descriptors / Indicators: Aching;Dull Pain Intervention(s): Limited activity within patient's tolerance;Patient requesting pain meds-RN notified;Repositioned    Home Living Family/patient expects to be discharged to::  Private residence Living Arrangements: Alone Available Help  at Discharge: Family(daughter will stay for 4 weeks) Type of Home: House Home Access: Stairs to enter Entrance Stairs-Rails: Left;Right Entrance Stairs-Number of Steps: 6 at front with 2 hand rails(3 at garage with Rt rail) Home Layout: Two level;Able to live on main level with bedroom/bathroom Home Equipment: None Additional Comments: can stay on recliner or sofa    Prior Function Level of Independence: Independent         Comments: works full time as Software engineer with [employer]   Dominant Hand: Right    Extremity/Trunk Assessment   Upper Extremity Assessment Upper Extremity Assessment: Overall WFL for tasks assessed    Lower Extremity Assessment Lower Extremity Assessment: LLE deficits/detail LLE Deficits / Details: good quad activation, no extensor lag with SLR LLE Sensation: WNL LLE Coordination: WNL    Cervical / Trunk Assessment Cervical / Trunk Assessment: Normal  Communication   Communication: No difficulties  Cognition Arousal/Alertness: Awake/alert Behavior During Therapy: WFL for tasks assessed/performed Overall Cognitive Status: Within Functional Limits for tasks assessed         General Comments      Exercises Total Joint Exercises Ankle Circles/Pumps: AROM;Both;20 reps;Supine Quad Sets: AROM;Left;5 reps;Supine Short Arc Quad: AROM;Left;5 reps;Supine Heel Slides: AROM;Left;5 reps;Supine Hip ABduction/ADduction: AROM;Left;5 reps;Supine Straight Leg Raises: AROM;Left;5 reps;Supine Long Arc Quad: (educated on with demonstration) Knee Flexion: (educated on with demonstration)   Assessment/Plan    PT Assessment Patient needs continued PT services  PT Problem List Decreased range of motion;Decreased strength;Decreased activity tolerance;Decreased balance;Decreased mobility;Decreased knowledge of use of DME;Pain;Decreased knowledge of precautions       PT  Treatment Interventions Gait training;DME instruction;Therapeutic exercise;Functional mobility training;Stair training;Therapeutic activities;Balance training;Patient/family education    PT Goals (Current goals can be found in the Care Plan section)  Acute Rehab PT Goals Patient Stated Goal: to get home and recover PT Goal Formulation: With patient Time For Goal Achievement: 03/16/20 Potential to Achieve Goals: Good    Frequency 7X/week    AM-PAC PT "6 Clicks" Mobility  Outcome Measure Help needed turning from your back to your side while in a flat bed without using bedrails?: None Help needed moving from lying on your back to sitting on the side of a flat bed without using bedrails?: None Help needed moving to and from a bed to a chair (including a wheelchair)?: A Little Help needed standing up from a chair using your arms (e.g., wheelchair or bedside chair)?: A Little Help needed to walk in hospital room?: A Little Help needed climbing 3-5 steps with a railing? : A Little 6 Click Score: 20    End of Session Equipment Utilized During Treatment: Gait belt Activity Tolerance: Patient tolerated treatment well Patient left: in bed;with call bell/phone within reach Nurse Communication: Mobility status PT Visit Diagnosis: Muscle weakness (generalized) (M62.81);Difficulty in walking, not elsewhere classified (R26.2)    Time: 2902-1115 PT Time Calculation (min) (ACUTE ONLY): 51 min   Charges:   PT Evaluation $PT Eval Low Complexity: 1 Low PT Treatments $Gait Training: 8-22 mins $Therapeutic Exercise: 8-22 mins       Verner Mould, DPT Physical Therapist with [facility] [phone]  03/09/2020 4:24 PM

## 2020-03-09 NOTE — Anesthesia Procedure Notes (Signed)
Anesthesia Regional Block: Adductor canal block   Pre-Anesthetic Checklist: ,, timeout performed, Correct Patient, Correct Site, Correct Laterality, Correct Procedure, Correct Position, site marked, Risks and benefits discussed,  Surgical consent,  Pre-op evaluation,  At surgeon's request and post-op pain management  Laterality: Left and Lower  Prep: chloraprep       Needles:  Injection technique: Single-shot  Needle Type: Echogenic Needle     Needle Length: 9cm  Needle Gauge: 21     Additional Needles:   Procedures:,,,, ultrasound used (permanent image in chart),,,,  Narrative:  Start time: 03/09/2020 9:30 AM End time: 03/09/2020 9:36 AM Injection made incrementally with aspirations every 5 mL.  Performed by: Personally  Anesthesiologist: Annye Asa, MD  Additional Notes: Pt identified in Holding room.  Monitors applied. Working IV access confirmed. Sterile prep L thigh.  #21ga ECHOgenic needle into adductor canal with US guidance.  20cc 0.75% Ropivacaine injected incrementally after negative test dose.  Patient asymptomatic, VSS, no heme aspirated, tolerated well.  Jenita Seashore, MD

## 2020-03-09 NOTE — Transfer of Care (Signed)
Immediate Anesthesia Transfer of Care Note  Patient: Morgan Williams  Procedure(s) Performed: TOTAL KNEE ARTHROPLASTY (Left Knee)  Patient Location: PACU  Anesthesia Type:Regional and Spinal  Level of Consciousness: sedated  Airway & Oxygen Therapy: Patient Spontanous Breathing and Patient connected to face mask oxygen  Post-op Assessment: Report given to RN and Post -op Vital signs reviewed and stable  Post vital signs: Reviewed and stable  Last Vitals:  Vitals Value Taken Time  BP    Temp    Pulse 60 03/09/20 1147  Resp 16 03/09/20 1147  SpO2 100 % 03/09/20 1147  Vitals shown include unvalidated device data.  Last Pain:  Vitals:   03/09/20 0839  TempSrc: Oral  PainSc:       Patients Stated Pain Goal: 3 ([id number])  Complications: No apparent anesthesia complications

## 2020-03-09 NOTE — Anesthesia Procedure Notes (Signed)
Spinal  Patient location during procedure: OR End time: 03/09/2020 10:17 AM Staffing Performed: resident/CRNA  Resident/CRNA: Caryl Pina T, CRNA Preanesthetic Checklist Completed: patient identified, IV checked, site marked, risks and benefits discussed, surgical consent, monitors and equipment checked, pre-op evaluation and timeout performed Spinal Block Patient position: sitting Prep: ChloraPrep Patient monitoring: heart rate, cardiac monitor, continuous pulse ox and blood pressure Approach: midline Location: L3-4 Injection technique: single-shot Needle Needle type: Pencan  Needle gauge: 24 G Needle length: 9 cm Assessment Sensory level: T4 Additional Notes Expiration date of kit checked and confirmed. Patient tolerated procedure well, without complications.

## 2020-03-09 NOTE — Anesthesia Preprocedure Evaluation (Addendum)
Anesthesia Evaluation  Patient identified by MRN, date of birth, ID band Patient awake    Reviewed: Allergy & Precautions, NPO status , Patient's Chart, lab work & pertinent test results  History of Anesthesia Complications Negative for: history of anesthetic complications  Airway Mallampati: I  TM Distance: >3 FB Neck ROM: Full    Dental  (+) Dental Advisory Given, Caps   Pulmonary COPD,  COPD inhaler, former smoker,  03/05/2020 SARS coronavirus NEG   breath sounds clear to auscultation       Cardiovascular hypertension, Pt. on medications (-) angina Rhythm:Regular Rate:Normal  '18 ECHO: EF 60-65%, valves OK   Neuro/Psych negative neurological ROS     GI/Hepatic Neg liver ROS, GERD  Medicated and Controlled,  Endo/Other  diabetes (glu 97), Oral Hypoglycemic AgentsHypothyroidism Morbid obesity  Renal/GU negative Renal ROS     Musculoskeletal  (+) Arthritis ,   Abdominal (+) + obese,   Peds  Hematology negative hematology ROS (+)   Anesthesia Other Findings   Reproductive/Obstetrics                            Anesthesia Physical Anesthesia Plan  ASA: III  Anesthesia Plan: Spinal   Post-op Pain Management:  Regional for Post-op pain   Induction:   PONV Risk Score and Plan: 2 and Treatment may vary due to age or medical condition  Airway Management Planned: Natural Airway and Simple Face Mask  Additional Equipment: None  Intra-op Plan:   Post-operative Plan:   Informed Consent: I have reviewed the patients History and Physical, chart, labs and discussed the procedure including the risks, benefits and alternatives for the proposed anesthesia with the patient or authorized representative who has indicated his/her understanding and acceptance.     Dental advisory given  Plan Discussed with: Surgeon and CRNA  Anesthesia Plan Comments: (Plan routine monitors, SAB with adductor  canal block for post op analgesia)       Anesthesia Quick Evaluation

## 2020-03-09 NOTE — Progress Notes (Signed)
Orthopedic Tech Progress Note Patient Details:  Derrionna Fanfan [birth year] [id number]  Patient ID: [name], female   DOB: 13-Oct-1956, 64 y.o.   MRN: [id number]   [name] 03/09/2020, 1:00 PMPt going home removed CPM.

## 2020-03-09 NOTE — Interval H&P Note (Signed)
History and Physical Interval Note:  03/09/2020 8:18 AM  Morgan Williams  has presented today for surgery, with the diagnosis of left knee osteoarthritis.  The various methods of treatment have been discussed with the patient and family. After consideration of risks, benefits and other options for treatment, the patient has consented to  Procedure(s): TOTAL KNEE ARTHROPLASTY (Left) as a surgical intervention.  The patient's history has been reviewed, patient examined, no change in status, stable for surgery.  I have reviewed the patient's chart and labs.  Questions were answered to the patient's satisfaction.     Pilar Plate Kasir Hallenbeck

## 2020-03-09 NOTE — Progress Notes (Signed)
Assisted Dr. Carswell Jackson with left, ultrasound guided, adductor canal block. Side rails up, monitors on throughout procedure. See vital signs in flow sheet. Tolerated Procedure well.  

## 2020-03-09 NOTE — Progress Notes (Signed)
Orthopedic Tech Progress Note Patient Details:  Morgan Williams 21-Nov-1955 [id number]  CPM Left Knee CPM Left Knee: On Left Knee Flexion (Degrees): 40 Left Knee Extension (Degrees): 10 Additional Comments: Trapeze bar  Post Interventions Patient Tolerated: Well Instructions Provided: Care of device  [name] 03/09/2020, 11:59 AM

## 2020-03-09 NOTE — Op Note (Signed)
OPERATIVE REPORT-TOTAL KNEE ARTHROPLASTY   Pre-operative diagnosis- Osteoarthritis  Left knee(s)  Post-operative diagnosis- Osteoarthritis Left knee(s)  Procedure-  Left  Total Knee Arthroplasty  Surgeon- Morgan Plover. Denea Cheaney, MD  Assistant- Morgan Duty, PA-C   Anesthesia-  Adductor canal block and spinal  EBL-20 mL   Drains Hemovac  Tourniquet time-  Total Tourniquet Time Documented: Thigh (Left) - 35 minutes Total: Thigh (Left) - 35 minutes     Complications- None  Condition-PACU - hemodynamically stable.   Brief Clinical Note  Morgan Williams is a 64 y.o. year old female with end stage OA of her left knee with progressively worsening pain and dysfunction. She has constant pain, with activity and at rest and significant functional deficits with difficulties even with ADLs. She has had extensive non-op management including analgesics, injections of cortisone and viscosupplements, and home exercise program, but remains in significant pain with significant dysfunction. Radiographs show bone on bone arthritis medial and patellofemoral. She presents now for left Total Knee Arthroplasty.    Procedure in detail---   The patient is brought into the operating room and positioned supine on the operating table. After successful administration of  Adductor canal block and spinal,   a tourniquet is placed high on the  Left thigh(s) and the lower extremity is prepped and draped in the usual sterile fashion. Time out is performed by the operating team and then the  Left lower extremity is wrapped in Esmarch, knee flexed and the tourniquet inflated to 300 mmHg.       A midline incision is made with a ten blade through the subcutaneous tissue to the level of the extensor mechanism. A fresh blade is used to make a medial parapatellar arthrotomy. Soft tissue over the proximal medial tibia is subperiosteally elevated to the joint line with a knife and into the semimembranosus bursa with a  Cobb elevator. Soft tissue over the proximal lateral tibia is elevated with attention being paid to avoiding the patellar tendon on the tibial tubercle. The patella is everted, knee flexed 90 degrees and the ACL and PCL are removed. Findings are bone on bone ,edial and patellofemoral        The drill is used to create a starting hole in the distal femur and the canal is thoroughly irrigated with sterile saline to remove the fatty contents. The 5 degree Left  valgus alignment guide is placed into the femoral canal and the distal femoral cutting block is pinned to remove 9 mm off the distal femur. Resection is made with an oscillating saw.      The tibia is subluxed forward and the menisci are removed. The extramedullary alignment guide is placed referencing proximally at the medial aspect of the tibial tubercle and distally along the second metatarsal axis and tibial crest. The block is pinned to remove 26mm off the more deficient medial  side. Resection is made with an oscillating saw. Size 4is the most appropriate size for the tibia and the proximal tibia is prepared with the modular drill and keel punch for that size.      The femoral sizing guide is placed and size 5 is most appropriate. Rotation is marked off the epicondylar axis and confirmed by creating a rectangular flexion gap at 90 degrees. The size 5 cutting block is pinned in this rotation and the anterior, posterior and chamfer cuts are made with the oscillating saw. The intercondylar block is then placed and that cut is made.  Trial size 4 tibial component, trial size 5 narrow posterior stabilized femur and a 7  mm posterior stabilized rotating platform insert trial is placed. Full extension is achieved with excellent varus/valgus and anterior/posterior balance throughout full range of motion. The patella is everted and thickness measured to be 22  mm. Free hand resection is taken to 12 mm, a 35 template is placed, lug holes are drilled, trial  patella is placed, and it tracks normally. Osteophytes are removed off the posterior femur with the trial in place. All trials are removed and the cut bone surfaces prepared with pulsatile lavage. Cement is mixed and once ready for implantation, the size 4 tibial implant, size  5 narrow posterior stabilized femoral component, and the size 35 patella are cemented in place and the patella is held with the clamp. The trial insert is placed and the knee held in full extension. The Exparel (20 ml mixed with 60 ml saline) is injected into the extensor mechanism, posterior capsule, medial and lateral gutters and subcutaneous tissues.  All extruded cement is removed and once the cement is hard the permanent 7 mm posterior stabilized rotating platform insert is placed into the tibial tray.      The wound is copiously irrigated with saline solution and the extensor mechanism closed over a hemovac drain with #1 V-loc suture. The tourniquet is released for a total tourniquet time of 34  minutes. Flexion against gravity is 140 degrees and the patella tracks normally. Subcutaneous tissue is closed with 2.0 vicryl and subcuticular with running 4.0 Monocryl. The incision is cleaned and dried and steri-strips and a bulky sterile dressing are applied. The limb is placed into a knee immobilizer and the patient is awakened and transported to recovery in stable condition.      Please note that a surgical assistant was a medical necessity for this procedure in order to perform it in a safe and expeditious manner. Surgical assistant was necessary to retract the ligaments and vital neurovascular structures to prevent injury to them and also necessary for proper positioning of the limb to allow for anatomic placement of the prosthesis.   Morgan Plover Calyssa Zobrist, MD    03/09/2020, 11:16 AM

## 2020-03-09 NOTE — Discharge Instructions (Addendum)
Morgan Arabian, MD Total Joint Specialist [facility] [address], [address] [place], [place] [address] [phone]    TOTAL KNEE REPLACEMENT POSTOPERATIVE DIRECTIONS    Knee Rehabilitation, Guidelines Following Surgery  Results after knee surgery are often greatly improved when you follow the exercise, range of motion and muscle strengthening exercises prescribed by your doctor. Safety measures are also important to protect the knee from further injury. If any of these exercises cause you to have increased pain or swelling in your knee joint, decrease the amount until you are comfortable again and slowly increase them. If you have problems or questions, call your caregiver or physical therapist for advice.   BLOOD CLOT PREVENTION . Take a 10 mg Xarelto once a day for three weeks following surgery.  . You may resume your vitamins/supplements once you have discontinued the Xarelto. . Do not take any NSAIDs (Advil, Aleve, Ibuprofen, Meloxicam, etc.) until you have discontinued the Xarelto.   HOME CARE INSTRUCTIONS  . Remove items at home which could result in a fall. This includes throw rugs or furniture in walking pathways.   ICE to the affected knee as much as tolerated. Icing helps control swelling. If the swelling is well controlled you will be more comfortable and rehab easier. Continue to use ice on the knee for pain and swelling from surgery. You may notice swelling that will progress down to the foot and ankle. This is normal after surgery. Elevate the leg when you are not up walking on it.    Continue to use the breathing machine which will help keep your temperature down.  It is common for your temperature to cycle up and down following surgery, especially at night when you are not up moving around and exerting yourself.  The breathing machine keeps your lungs expanded and your temperature down.  Do not place pillow under knee, focus on keeping the knee  straight while resting  PAIN CONTROL Achieving adequate pain control can be challenging in the first 48-72 hours after the nerve block wears off. During this time it is best to stay ahead of the pain by taking your prescribed pain meds every 4-6 hours. Once the pain is well controlled (you will not be pain free but the goal is having it under control) you may begin slowly weaning off the medications  DIET You may resume your previous home diet once you are discharged from the hospital.  DRESSING / [place] / SHOWERING . Keep your bulky bandage on for 2 days. On the third post-operative day you may remove the Ace bandage and gauze. There is a waterproof adhesive bandage on your skin which will stay in place until your first follow-up appointment. Once you remove this you will not need to place another bandage . You may begin showering 3 days following surgery, but do not submerge the incision under water.  ACTIVITY For the first 5 days the key is rest and control of pain and swelling . You should rest, ice and elevate the leg for 50 minutes out of every hour. Get up and walk/stretch for 10 minutes per hour. After 5 days you can increase your activity slowly as tolerated . Walk with your walker as instructed. Use the walker until you are comfortable transitioning to a cane. Walk with the cane in the opposite hand of the operative leg. You may discontinue the cane once you are comfortable. . You may discontinue the knee immobilizer once you are able to  perform a straight leg raise while lying down . Avoid periods of inactivity such as sitting longer than an hour when not asleep. This helps prevent blood clots.  . Do your home exercises twice a day starting on post-operative day 3. On the days you go to physical therapy, just do the home exercises once that day. . Do not drive a car until released by your surgeon.  . Do not drive while taking narcotics.  TED HOSE STOCKINGS Wear the elastic  stockings on both legs for three weeks following surgery during the day. You may remove them at night for sleeping.  WEIGHT BEARING You may bear weight as tolerated on the operative leg.  POSTOPERATIVE CONSTIPATION PROTOCOL Constipation - defined medically as fewer than three stools per week and severe constipation as less than one stool per week.  One of the most common issues patients have following surgery is constipation.  Even if you have a regular bowel pattern at home, your normal regimen is likely to be disrupted due to multiple reasons following surgery.  Combination of anesthesia, postoperative narcotics, change in appetite and fluid intake all can affect your bowels.  In order to avoid complications following surgery, here are some recommendations in order to help you during your recovery period.  Colace (docusate) - Pick up an over-the-counter form of Colace or another stool softener and take twice a day as long as you are requiring postoperative pain medications.  Take with a full glass of water daily.  If you experience loose stools or diarrhea, hold the colace until you stool forms back up.  If your symptoms do not get better within 1 week or if they get worse, check with your doctor.  MiraLax (polyethylene glycol) - Pick up over-the-counter to have on hand.  MiraLax is a solution that will increase the amount of water in your bowels to assist with bowel movements.  Take as directed and can mix with a glass of water, juice, soda, coffee, or tea.  Take if you go more than two days without a movement. Do not use MiraLax more than once per day. Call your doctor if you are still constipated or irregular after using this medication for 7 days in a row.  If you continue to have problems with postoperative constipation, please contact the office for further assistance and recommendations.  If you experience "the worst abdominal pain ever" or develop nausea or vomiting, please contact the  office immediatly for further recommendations for treatment.  ITCHING  If you experience itching with your medications, try taking only a single pain pill, or even half a pain pill at a time.  You can also use Benadryl over the counter for itching or also to help with sleep.   MEDICATIONS See your medication summary on the "After Visit Summary" that the nursing staff will review with you prior to discharge.  You may have some home medications which will be placed on hold until you complete the course of blood thinner medication.  It is important for you to complete the blood thinner medication as prescribed by your surgeon.  Continue your approved medications as instructed at time of discharge.  PRECAUTIONS If you experience chest pain or shortness of breath - call 911 immediately for transfer to the hospital emergency department.  If you develop a fever greater that 101 F, purulent drainage from wound, increased redness or drainage from wound, foul odor from the wound/dressing, or calf pain - CONTACT YOUR SURGEON.  FOLLOW-UP APPOINTMENTS Make sure you keep all of your appointments after your operation with your surgeon and caregivers. You should call the office at the above phone number and make an appointment for approximately two weeks after the date of your surgery or on the date instructed by your surgeon outlined in the "After Visit Summary".  MAKE SURE YOU:  . Understand these instructions.  . Get help right away if you are not doing well or get worse.   DENTAL ANTIBIOTICS:  In most cases prophylactic antibiotics for Dental procdeures after total joint surgery are not necessary.  Exceptions are as follows:  1. History of prior total joint infection  2. Severely immunocompromised (Organ Transplant, cancer chemotherapy, Rheumatoid biologic meds such as [place])  3. Poorly controlled diabetes (A1C &gt; 8.0, blood glucose over  200)  If you have one of these conditions, contact your surgeon for an antibiotic prescription, prior to your dental procedure.    Pick up stool softner and laxative for home use following surgery while on pain medications. May shower starting three days after surgery. Please use a clean towel to pat the incision dry following showers. Continue to use ice for pain and swelling after surgery. Do not use any lotions or creams on the incision until instructed by your surgeon.

## 2020-03-10 ENCOUNTER — Encounter: Payer: Self-pay | Admitting: *Deleted

## 2020-03-12 DIAGNOSIS — M25662 Stiffness of left knee, not elsewhere classified: Secondary | ICD-10-CM | POA: Diagnosis not present

## 2020-03-12 DIAGNOSIS — M25562 Pain in left knee: Secondary | ICD-10-CM | POA: Diagnosis not present

## 2020-03-16 DIAGNOSIS — M25562 Pain in left knee: Secondary | ICD-10-CM | POA: Diagnosis not present

## 2020-03-16 DIAGNOSIS — M25662 Stiffness of left knee, not elsewhere classified: Secondary | ICD-10-CM | POA: Diagnosis not present

## 2020-03-18 DIAGNOSIS — M25562 Pain in left knee: Secondary | ICD-10-CM | POA: Diagnosis not present

## 2020-03-18 DIAGNOSIS — M25662 Stiffness of left knee, not elsewhere classified: Secondary | ICD-10-CM | POA: Diagnosis not present

## 2020-03-20 DIAGNOSIS — M25662 Stiffness of left knee, not elsewhere classified: Secondary | ICD-10-CM | POA: Diagnosis not present

## 2020-03-20 DIAGNOSIS — M25562 Pain in left knee: Secondary | ICD-10-CM | POA: Diagnosis not present

## 2020-03-23 DIAGNOSIS — M25562 Pain in left knee: Secondary | ICD-10-CM | POA: Diagnosis not present

## 2020-03-23 DIAGNOSIS — M25662 Stiffness of left knee, not elsewhere classified: Secondary | ICD-10-CM | POA: Diagnosis not present

## 2020-03-23 MED FILL — METHOCARBAMOL 500 MG TABS: 500 | 10 days supply | Qty: 40 | Fill #0

## 2020-03-24 MED FILL — AMOXICILLIN 500 MG CAPSULE: 500 | 5 days supply | Qty: 20 | Fill #0

## 2020-03-25 DIAGNOSIS — M25562 Pain in left knee: Secondary | ICD-10-CM | POA: Diagnosis not present

## 2020-03-25 DIAGNOSIS — M25662 Stiffness of left knee, not elsewhere classified: Secondary | ICD-10-CM | POA: Diagnosis not present

## 2020-03-27 DIAGNOSIS — M25562 Pain in left knee: Secondary | ICD-10-CM | POA: Diagnosis not present

## 2020-03-27 DIAGNOSIS — M25662 Stiffness of left knee, not elsewhere classified: Secondary | ICD-10-CM | POA: Diagnosis not present

## 2020-03-31 DIAGNOSIS — M25562 Pain in left knee: Secondary | ICD-10-CM | POA: Diagnosis not present

## 2020-03-31 DIAGNOSIS — M25662 Stiffness of left knee, not elsewhere classified: Secondary | ICD-10-CM | POA: Diagnosis not present

## 2020-04-01 DIAGNOSIS — M25662 Stiffness of left knee, not elsewhere classified: Secondary | ICD-10-CM | POA: Diagnosis not present

## 2020-04-01 DIAGNOSIS — M25562 Pain in left knee: Secondary | ICD-10-CM | POA: Diagnosis not present

## 2020-04-02 ENCOUNTER — Ambulatory Visit (INDEPENDENT_AMBULATORY_CARE_PROVIDER_SITE_OTHER): Payer: 59 | Admitting: Physician Assistant

## 2020-04-02 ENCOUNTER — Encounter (INDEPENDENT_AMBULATORY_CARE_PROVIDER_SITE_OTHER): Payer: Self-pay | Admitting: Physician Assistant

## 2020-04-02 ENCOUNTER — Other Ambulatory Visit: Payer: Self-pay

## 2020-04-02 VITALS — BP 101/70 | HR 67 | Temp 98.1°F | Ht 63.0 in | Wt 187.0 lb

## 2020-04-02 DIAGNOSIS — E038 Other specified hypothyroidism: Secondary | ICD-10-CM | POA: Diagnosis not present

## 2020-04-02 DIAGNOSIS — Z9189 Other specified personal risk factors, not elsewhere classified: Secondary | ICD-10-CM

## 2020-04-02 DIAGNOSIS — E669 Obesity, unspecified: Secondary | ICD-10-CM | POA: Diagnosis not present

## 2020-04-02 DIAGNOSIS — Z6833 Body mass index (BMI) 33.0-33.9, adult: Secondary | ICD-10-CM

## 2020-04-02 DIAGNOSIS — R7303 Prediabetes: Secondary | ICD-10-CM | POA: Diagnosis not present

## 2020-04-02 MED ORDER — LEVOTHYROXINE SODIUM 88 MCG PO TABS: 88.0000 ug | ORAL_TABLET | Freq: Every day | ORAL | 0 refills | Status: DC

## 2020-04-02 MED ORDER — METFORMIN HCL 500 MG PO TABS: 500.0000 mg | ORAL_TABLET | Freq: Every day | ORAL | 0 refills | Status: DC

## 2020-04-02 MED ORDER — LEVOTHYROXINE SODIUM 100 MCG PO TABS: 100.0000 ug | ORAL_TABLET | Freq: Every day | ORAL | 0 refills | Status: DC

## 2020-04-02 MED FILL — metFORMIN HCL 500 MG TABS: 500 | 30 days supply | Qty: 30 | Fill #0

## 2020-04-02 MED FILL — LEVOTHYROXINE 88 MCG TABLET: 88 | 90 days supply | Qty: 90 | Fill #0

## 2020-04-02 MED FILL — LEVOTHYROXINE 100 MCG TABLE: 100 | 90 days supply | Qty: 90 | Fill #0

## 2020-04-02 NOTE — Progress Notes (Signed)
Chief Complaint:   OBESITY Morgan Williams is here to discuss her progress with her obesity treatment plan along with follow-up of her obesity related diagnoses. Morgan Williams is on the Category 2 Plan and states she is following her eating plan approximately 50% of the time. Morgan Williams states she is exercising 0 minutes 0 times per week.  Today's visit was #: 33 Starting weight: 219 lbs Starting date: 09/20/2018 Today's weight: 187 lbs Today's date: 04/02/2020 Total lbs lost to date: 32 Total lbs lost since last in-office visit: 2  Interim History: Morgan Williams just had a total knee replacement 3 weeks ago and is doing well. Her appetite is normal and she has been eating foods that her friends brought her.  Subjective:   Other specified hypothyroidism. Morgan Williams denies excessive fatigue. She is on levothyroxine.  Lab Results  Component Value Date   TSH 1.490 05/02/2019   Prediabetes. Morgan Williams has a diagnosis of prediabetes based on her elevated HgA1c and was informed this puts her at greater risk of developing diabetes. She continues to work on diet and exercise to decrease her risk of diabetes. She denies nausea, vomiting, diarrhea, or polyphagia. Morgan Williams is on metformin.  Lab Results  Component Value Date   HGBA1C 5.7 (H) 02/28/2020   Lab Results  Component Value Date   INSULIN 8.3 09/05/2019   INSULIN 11.3 05/02/2019   INSULIN 17.2 09/20/2018   At risk for diabetes mellitus. Morgan Williams is at higher than average risk for developing diabetes due to her obesity.   Assessment/Plan:   Other specified hypothyroidism. Patient with long-standing hypothyroidism, on levothyroxine therapy. She appears euthyroid. Orders and follow up as documented in patient record. Refills were given for levothyroxine (SYNTHROID) 100 MCG tablet, levothyroxine (SYNTHROID) 88 MCG tablet #90 with 0 refills and levothyroxine 100 mcg #0 with 0 refills.  Counseling . Good thyroid control is important for overall health.  Supratherapeutic thyroid levels are dangerous and will not improve weight loss results. . The correct way to take levothyroxine is fasting, with water, separated by at least 30 minutes from breakfast, and separated by more than 4 hours from calcium, iron, multivitamins, acid reflux medications (PPIs).    Prediabetes. Nikkol will continue to work on weight loss, exercise, and decreasing simple carbohydrates to help decrease the risk of diabetes. Refill was given for metFORMIN (GLUCOPHAGE) 500 MG tablet #30 with 0 refills.  At risk for diabetes mellitus. Morgan Williams was given approximately 15 minutes of diabetes education and counseling today. We discussed intensive lifestyle modifications today with an emphasis on weight loss as well as increasing exercise and decreasing simple carbohydrates in her diet. We also reviewed medication options with an emphasis on risk versus benefit of those discussed.   Repetitive spaced learning was employed today to elicit superior memory formation and behavioral change.  Class 1 obesity with serious comorbidity and body mass index (BMI) of 33.0 to 33.9 in adult, unspecified obesity type.  Morgan Williams is currently in the action stage of change. As such, her goal is to continue with weight loss efforts. She has agreed to the Category 2 Plan and will journal 400-500 calories and 35 grams of protein at supper daily.   Exercise goals: For substantial health benefits, adults should do at least 150 minutes (2 hours and 30 minutes) a week of moderate-intensity, or 75 minutes (1 hour and 15 minutes) a week of vigorous-intensity aerobic physical activity, or an equivalent combination of moderate- and vigorous-intensity aerobic activity. Aerobic activity should be performed  in episodes of at least 10 minutes, and preferably, it should be spread throughout the week.  Behavioral modification strategies: meal planning and cooking strategies and keeping healthy foods in the home.  Morgan Williams has  agreed to follow-up with our clinic in 3 weeks. She was informed of the importance of frequent follow-up visits to maximize her success with intensive lifestyle modifications for her multiple health conditions.   Objective:   Blood pressure 101/70, pulse 67, temperature 98.1 F (36.7 C), temperature source Oral, height 5\' 3"  (1.6 m), weight 187 lb (84.8 kg), last menstrual period 11/01/2003, SpO2 100 %. Body mass index is 33.13 kg/m.  General: Cooperative, alert, well developed, in no acute distress. HEENT: Conjunctivae and lids unremarkable. Cardiovascular: Regular rhythm.  Lungs: Normal work of breathing. Neurologic: No focal deficits.   Lab Results  Component Value Date   CREATININE 0.69 02/28/2020   BUN 24 (H) 02/28/2020   NA 139 02/28/2020   K 3.7 02/28/2020   CL 104 02/28/2020   CO2 26 02/28/2020   Lab Results  Component Value Date   ALT 19 02/28/2020   AST 21 02/28/2020   ALKPHOS 51 02/28/2020   BILITOT 0.8 02/28/2020   Lab Results  Component Value Date   HGBA1C 5.7 (H) 02/28/2020   HGBA1C 5.6 09/05/2019   HGBA1C 5.5 05/02/2019   HGBA1C 6.3 (H) 09/20/2018   Lab Results  Component Value Date   INSULIN 8.3 09/05/2019   INSULIN 11.3 05/02/2019   INSULIN 17.2 09/20/2018   Lab Results  Component Value Date   TSH 1.490 05/02/2019   Lab Results  Component Value Date   CHOL 144 09/05/2019   HDL 41 09/05/2019   LDLCALC 81 09/05/2019   TRIG 121 09/05/2019   CHOLHDL 3.6 09/20/2018   Lab Results  Component Value Date   WBC 7.3 02/28/2020   HGB 12.2 02/28/2020   HCT 37.8 02/28/2020   MCV 95.0 02/28/2020   PLT 189 02/28/2020   No results found for: IRON, TIBC, FERRITIN  Attestation Statements:   Reviewed by clinician on day of visit: allergies, medications, problem list, medical history, surgical history, family history, social history, and previous encounter notes.  Morgan Williams, am acting as transcriptionist for Abby Potash, PA-C   I have  reviewed the above documentation for accuracy and completeness, and I agree with the above. Abby Potash, PA-C

## 2020-04-03 DIAGNOSIS — M25662 Stiffness of left knee, not elsewhere classified: Secondary | ICD-10-CM | POA: Diagnosis not present

## 2020-04-03 DIAGNOSIS — M25562 Pain in left knee: Secondary | ICD-10-CM | POA: Diagnosis not present

## 2020-04-06 DIAGNOSIS — M25662 Stiffness of left knee, not elsewhere classified: Secondary | ICD-10-CM | POA: Diagnosis not present

## 2020-04-06 DIAGNOSIS — M25562 Pain in left knee: Secondary | ICD-10-CM | POA: Diagnosis not present

## 2020-04-08 DIAGNOSIS — M25562 Pain in left knee: Secondary | ICD-10-CM | POA: Diagnosis not present

## 2020-04-08 DIAGNOSIS — M25662 Stiffness of left knee, not elsewhere classified: Secondary | ICD-10-CM | POA: Diagnosis not present

## 2020-04-09 ENCOUNTER — Encounter: Attending: Sports Medicine | Primary: Family Medicine

## 2020-04-10 DIAGNOSIS — M25562 Pain in left knee: Secondary | ICD-10-CM | POA: Diagnosis not present

## 2020-04-10 DIAGNOSIS — M25662 Stiffness of left knee, not elsewhere classified: Secondary | ICD-10-CM | POA: Diagnosis not present

## 2020-04-13 DIAGNOSIS — M25662 Stiffness of left knee, not elsewhere classified: Secondary | ICD-10-CM | POA: Diagnosis not present

## 2020-04-13 DIAGNOSIS — M25562 Pain in left knee: Secondary | ICD-10-CM | POA: Diagnosis not present

## 2020-04-14 DIAGNOSIS — Z471 Aftercare following joint replacement surgery: Secondary | ICD-10-CM | POA: Diagnosis not present

## 2020-04-14 DIAGNOSIS — Z96652 Presence of left artificial knee joint: Secondary | ICD-10-CM | POA: Diagnosis not present

## 2020-04-14 MED FILL — traMADol HCL 50 MG TABS: 50 | 6 days supply | Qty: 42 | Fill #0

## 2020-04-14 MED FILL — METHOCARBAMOL 500 MG TABS: 500 | 10 days supply | Qty: 40 | Fill #0

## 2020-04-15 DIAGNOSIS — M25662 Stiffness of left knee, not elsewhere classified: Secondary | ICD-10-CM | POA: Diagnosis not present

## 2020-04-15 DIAGNOSIS — M25562 Pain in left knee: Secondary | ICD-10-CM | POA: Diagnosis not present

## 2020-04-17 DIAGNOSIS — M25562 Pain in left knee: Secondary | ICD-10-CM | POA: Diagnosis not present

## 2020-04-17 DIAGNOSIS — M25662 Stiffness of left knee, not elsewhere classified: Secondary | ICD-10-CM | POA: Diagnosis not present

## 2020-04-20 DIAGNOSIS — M25662 Stiffness of left knee, not elsewhere classified: Secondary | ICD-10-CM | POA: Diagnosis not present

## 2020-04-20 DIAGNOSIS — M25562 Pain in left knee: Secondary | ICD-10-CM | POA: Diagnosis not present

## 2020-04-22 ENCOUNTER — Encounter (INDEPENDENT_AMBULATORY_CARE_PROVIDER_SITE_OTHER): Payer: Self-pay | Admitting: Physician Assistant

## 2020-04-22 ENCOUNTER — Ambulatory Visit (INDEPENDENT_AMBULATORY_CARE_PROVIDER_SITE_OTHER): Payer: 59 | Admitting: Physician Assistant

## 2020-04-22 ENCOUNTER — Other Ambulatory Visit: Payer: Self-pay

## 2020-04-22 VITALS — BP 117/75 | HR 69 | Temp 97.9°F | Ht 63.0 in | Wt 188.0 lb

## 2020-04-22 DIAGNOSIS — E559 Vitamin D deficiency, unspecified: Secondary | ICD-10-CM | POA: Diagnosis not present

## 2020-04-22 DIAGNOSIS — Z6833 Body mass index (BMI) 33.0-33.9, adult: Secondary | ICD-10-CM | POA: Diagnosis not present

## 2020-04-22 DIAGNOSIS — E669 Obesity, unspecified: Secondary | ICD-10-CM

## 2020-04-22 DIAGNOSIS — M25562 Pain in left knee: Secondary | ICD-10-CM | POA: Diagnosis not present

## 2020-04-22 DIAGNOSIS — M25662 Stiffness of left knee, not elsewhere classified: Secondary | ICD-10-CM | POA: Diagnosis not present

## 2020-04-22 MED FILL — FAMOTIDINE 20 MG TABS: 20 | 90 days supply | Qty: 180 | Fill #1

## 2020-04-22 MED FILL — HYDROCHLOROTHIAZIDE 12.5 MG: 12.5 | 90 days supply | Qty: 90 | Fill #1

## 2020-04-22 MED FILL — LOSARTAN POTASSIUM 50 MG TA: 50 | 90 days supply | Qty: 90 | Fill #1

## 2020-04-22 MED FILL — SIMVASTATIN 40 MG TABLET: 40 | 90 days supply | Qty: 90 | Fill #1

## 2020-04-22 NOTE — Progress Notes (Signed)
Chief Complaint:   OBESITY Jojo Geving is here to discuss her progress with her obesity treatment plan along with follow-up of her obesity related diagnoses. Thressa is on the Category 2 Plan and states she is following her eating plan approximately 75% of the time. Markala states she is exercising 0 minutes 0 times per week (had knee replacement 6 weeks ago).  Today's visit was #: 64 Starting weight: 219 lbs Starting date: 09/20/2018 Today's weight: 188 lbs Today's date: 04/22/2020 Total lbs lost to date: 31 Total lbs lost since last in-office visit: 0  Interim History: Savvy is not sleeping well due to discomfort since having a knee replacement, which is in turn affecting her food choices. She is not eating enough protein.  Subjective:   Vitamin D deficiency. Dalyn just restarted Vitamin D 5,000 units since coming off of it for surgery. Last Vitamin D was 46.0 on 09/05/2019.  Assessment/Plan:   Vitamin D deficiency. Low Vitamin D level contributes to fatigue and are associated with obesity, breast, and colon cancer. She agrees to continue to take Vitamin D and will follow-up for routine testing of Vitamin D, at least 2-3 times per year to avoid over-replacement.  Class 1 obesity with serious comorbidity and body mass index (BMI) of 33.0 to 33.9 in adult, unspecified obesity type.  Carrieann is currently in the action stage of change. As such, her goal is to continue with weight loss efforts. She has agreed to the Category 2 Plan.   Exercise goals: For substantial health benefits, adults should do at least 150 minutes (2 hours and 30 minutes) a week of moderate-intensity, or 75 minutes (1 hour and 15 minutes) a week of vigorous-intensity aerobic physical activity, or an equivalent combination of moderate- and vigorous-intensity aerobic activity. Aerobic activity should be performed in episodes of at least 10 minutes, and preferably, it should be spread throughout the  week.  Behavioral modification strategies: increasing lean protein intake and meal planning and cooking strategies.  Cody has agreed to follow-up with our clinic in 2 weeks. She was informed of the importance of frequent follow-up visits to maximize her success with intensive lifestyle modifications for her multiple health conditions.   Objective:   Blood pressure 117/75, pulse 69, temperature 97.9 F (36.6 C), temperature source Oral, height 5\' 3"  (1.6 m), weight 188 lb (85.3 kg), last menstrual period 11/01/2003, SpO2 100 %. Body mass index is 33.3 kg/m.  General: Cooperative, alert, well developed, in no acute distress. HEENT: Conjunctivae and lids unremarkable. Cardiovascular: Regular rhythm.  Lungs: Normal work of breathing. Neurologic: No focal deficits.   Lab Results  Component Value Date   CREATININE 0.69 02/28/2020   BUN 24 (H) 02/28/2020   NA 139 02/28/2020   K 3.7 02/28/2020   CL 104 02/28/2020   CO2 26 02/28/2020   Lab Results  Component Value Date   ALT 19 02/28/2020   AST 21 02/28/2020   ALKPHOS 51 02/28/2020   BILITOT 0.8 02/28/2020   Lab Results  Component Value Date   HGBA1C 5.7 (H) 02/28/2020   HGBA1C 5.6 09/05/2019   HGBA1C 5.5 05/02/2019   HGBA1C 6.3 (H) 09/20/2018   Lab Results  Component Value Date   INSULIN 8.3 09/05/2019   INSULIN 11.3 05/02/2019   INSULIN 17.2 09/20/2018   Lab Results  Component Value Date   TSH 1.490 05/02/2019   Lab Results  Component Value Date   CHOL 144 09/05/2019   HDL 41 09/05/2019  LDLCALC 81 09/05/2019   TRIG 121 09/05/2019   CHOLHDL 3.6 09/20/2018   Lab Results  Component Value Date   WBC 7.3 02/28/2020   HGB 12.2 02/28/2020   HCT 37.8 02/28/2020   MCV 95.0 02/28/2020   PLT 189 02/28/2020   No results found for: IRON, TIBC, FERRITIN  Attestation Statements:   Reviewed by clinician on day of visit: allergies, medications, problem list, medical history, surgical history, family history, social  history, and previous encounter notes.  Time spent on visit including pre-visit chart review and post-visit charting and care was 30 minutes.   IMichaelene Song, am acting as transcriptionist for Abby Potash, PA-C   I have reviewed the above documentation for accuracy and completeness, and I agree with the above. Abby Potash, PA-C

## 2020-04-23 MED FILL — GABAPENTIN 300 MG CAPSULE: 300 | 30 days supply | Qty: 30 | Fill #0

## 2020-04-27 DIAGNOSIS — M25662 Stiffness of left knee, not elsewhere classified: Secondary | ICD-10-CM | POA: Diagnosis not present

## 2020-04-27 DIAGNOSIS — M25562 Pain in left knee: Secondary | ICD-10-CM | POA: Diagnosis not present

## 2020-04-28 ENCOUNTER — Encounter: Attending: Sports Medicine | Primary: Family Medicine

## 2020-04-30 DIAGNOSIS — M25562 Pain in left knee: Secondary | ICD-10-CM | POA: Diagnosis not present

## 2020-04-30 DIAGNOSIS — M25662 Stiffness of left knee, not elsewhere classified: Secondary | ICD-10-CM | POA: Diagnosis not present

## 2020-05-04 DIAGNOSIS — H40023 Open angle with borderline findings, high risk, bilateral: Secondary | ICD-10-CM | POA: Diagnosis not present

## 2020-05-04 DIAGNOSIS — H353131 Nonexudative age-related macular degeneration, bilateral, early dry stage: Secondary | ICD-10-CM | POA: Diagnosis not present

## 2020-05-04 DIAGNOSIS — H524 Presbyopia: Secondary | ICD-10-CM | POA: Diagnosis not present

## 2020-05-04 DIAGNOSIS — E119 Type 2 diabetes mellitus without complications: Secondary | ICD-10-CM | POA: Diagnosis not present

## 2020-05-04 DIAGNOSIS — H0102A Squamous blepharitis right eye, upper and lower eyelids: Secondary | ICD-10-CM | POA: Diagnosis not present

## 2020-05-04 MED FILL — OLOPATADINE HCL 0.1% EYE DR: 0.1 | 25 days supply | Qty: 5 | Fill #0

## 2020-05-04 MED FILL — NEO/POLY/DEXAMET EYE OINT: 3.5-10000-0 | 30 days supply | Qty: 4 | Fill #0

## 2020-05-06 DIAGNOSIS — M25662 Stiffness of left knee, not elsewhere classified: Secondary | ICD-10-CM | POA: Diagnosis not present

## 2020-05-06 DIAGNOSIS — M25562 Pain in left knee: Secondary | ICD-10-CM | POA: Diagnosis not present

## 2020-05-07 ENCOUNTER — Other Ambulatory Visit: Payer: Self-pay

## 2020-05-07 ENCOUNTER — Ambulatory Visit (INDEPENDENT_AMBULATORY_CARE_PROVIDER_SITE_OTHER): Payer: 59 | Admitting: Physician Assistant

## 2020-05-07 ENCOUNTER — Encounter (INDEPENDENT_AMBULATORY_CARE_PROVIDER_SITE_OTHER): Payer: Self-pay | Admitting: Physician Assistant

## 2020-05-07 VITALS — BP 113/73 | HR 78 | Temp 98.0°F | Ht 63.0 in | Wt 188.0 lb

## 2020-05-07 DIAGNOSIS — R7303 Prediabetes: Secondary | ICD-10-CM | POA: Diagnosis not present

## 2020-05-07 DIAGNOSIS — E669 Obesity, unspecified: Secondary | ICD-10-CM

## 2020-05-07 DIAGNOSIS — Z6833 Body mass index (BMI) 33.0-33.9, adult: Secondary | ICD-10-CM

## 2020-05-07 DIAGNOSIS — E038 Other specified hypothyroidism: Secondary | ICD-10-CM

## 2020-05-07 DIAGNOSIS — E66811 Obesity, class 1: Secondary | ICD-10-CM

## 2020-05-07 DIAGNOSIS — Z9189 Other specified personal risk factors, not elsewhere classified: Secondary | ICD-10-CM | POA: Diagnosis not present

## 2020-05-07 MED ORDER — METFORMIN HCL 500 MG PO TABS: 500.0000 mg | ORAL_TABLET | Freq: Every day | ORAL | 0 refills | Status: DC

## 2020-05-07 MED FILL — METFORMIN HCL 500 MG TABS: 500 | 30 days supply | Qty: 30 | Fill #0

## 2020-05-07 NOTE — Progress Notes (Signed)
Chief Complaint:   OBESITY Morgan Williams is here to discuss her progress with her obesity treatment plan along with follow-up of her obesity related diagnoses. Morgan Williams is on the Category 2 Plan and states she is following her eating plan approximately 50% of the time. Morgan Williams states she is doing PT for her knee.   Today's visit was #: 3 Starting weight: 219 lbs Starting date: 09/20/2018 Today's weight: 188 lbs Today's date: 05/07/2020 Total lbs lost to date: 31 Total lbs lost since last in-office visit: 0  Interim History: Morgan Williams states that she is still frustrated with her knee pain from her surgery. She is sleeping better overall. She does note she has been emotionally eating.  Subjective:   Prediabetes. Morgan Williams has a diagnosis of prediabetes based on her elevated HgA1c and was informed this puts her at greater risk of developing diabetes. She continues to work on diet and exercise to decrease her risk of diabetes. She denies nausea or hypoglycemia. Morgan Williams is on metformin and denies polyphagia.  Lab Results  Component Value Date   HGBA1C 5.7 (H) 02/28/2020   Lab Results  Component Value Date   INSULIN 8.3 09/05/2019   INSULIN 11.3 05/02/2019   INSULIN 17.2 09/20/2018   Other specified hypothyroidism. Morgan Williams is on levothyroxine. Last thyroid panel was normal.  Lab Results  Component Value Date   TSH 1.490 05/02/2019   At risk for heart disease. Morgan Williams is at a higher than average risk for cardiovascular disease due to obesity.   Assessment/Plan:   Prediabetes. Morgan Williams will continue to work on weight loss, exercise, and decreasing simple carbohydrates to help decrease the risk of diabetes. Refill was given for metFORMIN (GLUCOPHAGE) 500 MG tablet #30 with 0 refills.  Other specified hypothyroidism. Patient with long-standing hypothyroidism, on levothyroxine therapy. She appears euthyroid. Orders and follow up as documented in patient record. Morgan Williams will continue levothyroxine  as directed.  Counseling . Good thyroid control is important for overall health. Supratherapeutic thyroid levels are dangerous and will not improve weight loss results. . The correct way to take levothyroxine is fasting, with water, separated by at least 30 minutes from breakfast, and separated by more than 4 hours from calcium, iron, multivitamins, acid reflux medications (PPIs).   At risk for heart disease. Morgan Williams was given approximately 15 minutes of coronary artery disease prevention counseling today. She is 64 y.o. female and has risk factors for heart disease including obesity. We discussed intensive lifestyle modifications today with an emphasis on specific weight loss instructions and strategies.   Repetitive spaced learning was employed today to elicit superior memory formation and behavioral change.  Class 1 obesity with serious comorbidity and body mass index (BMI) of 33.0 to 33.9 in adult, unspecified obesity type.  Morgan Williams is currently in the action stage of change. As such, her goal is to continue with weight loss efforts. She has agreed to the Category 2 Plan.   Exercise goals: For substantial health benefits, adults should do at least 150 minutes (2 hours and 30 minutes) a week of moderate-intensity, or 75 minutes (1 hour and 15 minutes) a week of vigorous-intensity aerobic physical activity, or an equivalent combination of moderate- and vigorous-intensity aerobic activity. Aerobic activity should be performed in episodes of at least 10 minutes, and preferably, it should be spread throughout the week.  Behavioral modification strategies: meal planning and cooking strategies and keeping healthy foods in the home.  Morgan Williams has agreed to follow-up with our clinic in 3  weeks. She was informed of the importance of frequent follow-up visits to maximize her success with intensive lifestyle modifications for her multiple health conditions.   Objective:   Blood pressure 113/73, pulse 78,  temperature 98 F (36.7 C), temperature source Oral, height 5\' 3"  (1.6 m), weight 188 lb (85.3 kg), last menstrual period 11/01/2003, SpO2 98 %. Body mass index is 33.3 kg/m.  General: Cooperative, alert, well developed, in no acute distress. HEENT: Conjunctivae and lids unremarkable. Cardiovascular: Regular rhythm.  Lungs: Normal work of breathing. Neurologic: No focal deficits.   Lab Results  Component Value Date   CREATININE 0.69 02/28/2020   BUN 24 (H) 02/28/2020   NA 139 02/28/2020   K 3.7 02/28/2020   CL 104 02/28/2020   CO2 26 02/28/2020   Lab Results  Component Value Date   ALT 19 02/28/2020   AST 21 02/28/2020   ALKPHOS 51 02/28/2020   BILITOT 0.8 02/28/2020   Lab Results  Component Value Date   HGBA1C 5.7 (H) 02/28/2020   HGBA1C 5.6 09/05/2019   HGBA1C 5.5 05/02/2019   HGBA1C 6.3 (H) 09/20/2018   Lab Results  Component Value Date   INSULIN 8.3 09/05/2019   INSULIN 11.3 05/02/2019   INSULIN 17.2 09/20/2018   Lab Results  Component Value Date   TSH 1.490 05/02/2019   Lab Results  Component Value Date   CHOL 144 09/05/2019   HDL 41 09/05/2019   LDLCALC 81 09/05/2019   TRIG 121 09/05/2019   CHOLHDL 3.6 09/20/2018   Lab Results  Component Value Date   WBC 7.3 02/28/2020   HGB 12.2 02/28/2020   HCT 37.8 02/28/2020   MCV 95.0 02/28/2020   PLT 189 02/28/2020   No results found for: IRON, TIBC, FERRITIN  Attestation Statements:   Reviewed by clinician on day of visit: allergies, medications, problem list, medical history, surgical history, family history, social history, and previous encounter notes.  IMichaelene Song, am acting as transcriptionist for Abby Potash, PA-C   I have reviewed the above documentation for accuracy and completeness, and I agree with the above. Abby Potash, PA-C

## 2020-05-08 DIAGNOSIS — M25662 Stiffness of left knee, not elsewhere classified: Secondary | ICD-10-CM | POA: Diagnosis not present

## 2020-05-08 DIAGNOSIS — M25562 Pain in left knee: Secondary | ICD-10-CM | POA: Diagnosis not present

## 2020-05-11 DIAGNOSIS — M25662 Stiffness of left knee, not elsewhere classified: Secondary | ICD-10-CM | POA: Diagnosis not present

## 2020-05-11 DIAGNOSIS — M25562 Pain in left knee: Secondary | ICD-10-CM | POA: Diagnosis not present

## 2020-05-14 DIAGNOSIS — M25562 Pain in left knee: Secondary | ICD-10-CM | POA: Diagnosis not present

## 2020-05-14 DIAGNOSIS — M25662 Stiffness of left knee, not elsewhere classified: Secondary | ICD-10-CM | POA: Diagnosis not present

## 2020-05-20 MED FILL — GABAPENTIN 300 MG CAPSULE: 300 | 30 days supply | Qty: 30 | Fill #1

## 2020-05-22 DIAGNOSIS — M1711 Unilateral primary osteoarthritis, right knee: Secondary | ICD-10-CM | POA: Diagnosis not present

## 2020-05-22 DIAGNOSIS — Z471 Aftercare following joint replacement surgery: Secondary | ICD-10-CM | POA: Diagnosis not present

## 2020-05-22 DIAGNOSIS — Z96652 Presence of left artificial knee joint: Secondary | ICD-10-CM | POA: Diagnosis not present

## 2020-05-22 MED FILL — traMADol HCL 50 MG TABS: 50 | 10 days supply | Qty: 20 | Fill #0

## 2020-05-22 NOTE — Progress Notes (Signed)
64 y.o. G22P2002 Divorced White or Caucasian female here for annual exam.  Had total knee replacement on the left.  Dr. Maureen Ralphs did the surgery.   She is not fully satisfied with her extension and flexion.  There is still some heat and stiffness in the knee.  She is going back to work the second week in August.  She is going to [employer] with friends next week.  Denies vaginal bleeding.    Did get vaccinated for Covid in December   Has prediabetes.    PCP:  Dr. Drema Dallas  Patient's last menstrual period was 11/01/2003 (approximate).          Sexually active: No.  The current method of family planning is post menopausal status.    Exercising: No.  exercise Smoker:  no  Health Maintenance: Pap:  11-23-15 neg, 12-28-2018 neg HPV HR neg History of abnormal Pap:  no MMG:  01-09-2020 category b density birads 1:neg Colonoscopy:  12-28-17 f/u 70yrs BMD:   2019 osteopenia-with [facility] TDaP:  2014 Pneumonia vaccine(s):  2010 Shingrix:   Done 2018 Hep C testing: done with pcp Screening Labs: Does with Dr. Drema Dallas   reports that she quit smoking about 21 years ago. Her smoking use included cigarettes. She quit after 25.00 years of use. She has never used smokeless tobacco. She reports current alcohol use of about 2.0 standard drinks of alcohol per week. She reports that she does not use drugs.  Past Medical History:  Diagnosis Date  . Allergy   . Anxiety   . Aortic atherosclerosis ([place])   . Arthritis   . Asthma   . Depression   . Eczema   . Fatty liver   . GERD (gastroesophageal reflux disease)   . Glaucoma   . Heart murmur   . Hyperlipidemia   . Hypertension   . Hypertensive retinopathy   . Hypothyroidism   . Insomnia   . Kidney problem   . Obesity   . Osteoarthritis   . Osteopenia   . Prediabetes   . Rosacea   . Thyroid disease   . Vitamin D deficiency   . Vocal cord nodules     Past Surgical History:  Procedure Laterality Date  . CESAREAN SECTION     x 1  .  MICROLARYNGOSCOPY Left 11/06/2017   Procedure: MICRO DIRECT LARYNGOSCOPY REMOVAL OF VOCAL CORD;  Surgeon: Jodi Marble, MD;  Location: [facility];  Service: ENT;  Laterality: Left;  . THYROIDECTOMY, PARTIAL  1975   due to Grave's disease  . TOTAL KNEE ARTHROPLASTY Left 03/09/2020   Procedure: TOTAL KNEE ARTHROPLASTY;  Surgeon: Gaynelle Arabian, MD;  Location: [facility];  Service: Orthopedics;  Laterality: Left;    Current Outpatient Medications  Medication Sig Dispense Refill  . acetaminophen (TYLENOL) 500 MG tablet Take 500 mg by mouth every 6 (six) hours as needed for moderate pain.     Marland Kitchen albuterol (VENTOLIN HFA) 108 (90 Base) MCG/ACT inhaler Inhale 1 puff into the lungs every 6 (six) hours as needed for wheezing or shortness of breath.    . ALPRAZolam (XANAX) 0.5 MG tablet Take 0.5 mg by mouth at bedtime as needed for anxiety.    . cetirizine (ZYRTEC) 10 MG tablet Take 10 mg by mouth daily. At bedtime    . Cholecalciferol (VITAMIN D3) 1.25 MG (50000 UT) CAPS SMARTSIG:By Mouth    . famotidine (PEPCID) 20 MG tablet Take 20 mg by mouth 2 (two) times daily.    Marland Kitchen  gabapentin (NEURONTIN) 300 MG capsule Take a 300 mg capsule three times a day for two weeks following surgery.Then take a 300 mg capsule two times a day for two weeks. Then take a 300 mg capsule once a day for two weeks. Then discontinue. (Patient taking differently: Take 300 mg by mouth at bedtime. Take a 300 mg capsule three times a day for two weeks following surgery.Then take a 300 mg capsule two times a day for two weeks. Then take a 300 mg capsule once a day for two weeks. Then discontinue. Pttaking it 1 po at bedtime) 84 capsule 0  . hydrochlorothiazide (MICROZIDE) 12.5 MG capsule Take 12.5 mg by mouth daily.    Marland Kitchen levothyroxine (SYNTHROID) 100 MCG tablet Take 1 tablet (100 mcg total) by mouth daily. 90 tablet 0  . levothyroxine (SYNTHROID) 88 MCG tablet Take 1 tablet (88 mcg total) by mouth daily before breakfast. 90 tablet  0  . losartan (COZAAR) 50 MG tablet Take 50 mg by mouth daily.    . metFORMIN (GLUCOPHAGE) 500 MG tablet Take 1 tablet (500 mg total) by mouth daily with supper. 30 tablet 0  . METRONIDAZOLE, TOPICAL, 0.75 % LOTN Apply 1 application topically daily.    Marland Kitchen neomycin-polymyxin b-dexamethasone (MAXITROL) 3.5-10000-0.1 OINT SMARTSIG:1 Application In Eye(s) Every Evening    . olopatadine (PATANOL) 0.1 % ophthalmic solution olopatadine 0.1 % eye drops  INSTILL 1 DROP BOTH EYES TWICE A DAY    . simvastatin (ZOCOR) 40 MG tablet Take 40 mg by mouth daily.    . STUDY - ARCADIA - aspirin 81mg  or placebo (PI - Sethi)     . amoxicillin (AMOXIL) 500 MG capsule amoxicillin 500 mg capsule  TAKE 4 CAPSULES BY MOUTH 1 HOUR PRIOR TO DENTAL PROCEDURE (Patient not taking: Reported on 05/28/2020)    . methocarbamol (ROBAXIN) 500 MG tablet Take 1 tablet (500 mg total) by mouth every 6 (six) hours as needed for muscle spasms. (Patient not taking: Reported on 05/28/2020) 40 tablet 0  . traMADol (ULTRAM) 50 MG tablet Take 1-2 tablets (50-100 mg total) by mouth every 6 (six) hours as needed for moderate pain. (Patient not taking: Reported on 05/28/2020) 40 tablet 0   No current facility-administered medications for this visit.    Family History  Problem Relation Age of Onset  . Hypertension Mother   . Hyperlipidemia Mother   . Diabetes Mother   . Thyroid disease Mother   . Hypertension Father   . Heart disease Father        CHF  . Colon cancer Father 59       stage 4  . Kidney disease Father   . Sleep apnea Father   . Obesity Father   . Thyroid disease Daughter   . Esophageal cancer Neg Hx   . Rectal cancer Neg Hx   . Liver cancer Neg Hx   . Pancreatic cancer Neg Hx   . Stomach cancer Neg Hx     Review of Systems  Constitutional: Negative.   HENT: Negative.   Eyes: Negative.   Respiratory: Negative.   Cardiovascular: Negative.   Gastrointestinal: Negative.   Endocrine: Negative.   Genitourinary:  Negative.   Musculoskeletal: Negative.   Skin: Negative.   Allergic/Immunologic: Negative.   Neurological: Negative.   Hematological: Negative.   Psychiatric/Behavioral: Negative.     Exam:   BP 110/72   Pulse 70   Resp 16   Ht 5' 3.5" (1.613 m)   Wt 191 lb (86.6  kg)   LMP 11/01/2003 (Approximate)   BMI 33.30 kg/m   Height: 5' 3.5" (161.3 cm)  General appearance: alert, cooperative and appears stated age Head: Normocephalic, without obvious abnormality, atraumatic Neck: no adenopathy, supple, symmetrical, trachea midline and thyroid normal to inspection and palpation Lungs: clear to auscultation bilaterally Breasts: normal appearance, no masses or tenderness Heart: regular rate and rhythm Abdomen: soft, non-tender; bowel sounds normal; no masses,  no organomegaly Extremities: extremities normal, atraumatic, no cyanosis or edema Skin: Skin color, texture, turgor normal. No rashes or lesions Lymph nodes: Cervical, supraclavicular, and axillary nodes normal. No abnormal inguinal nodes palpated Neurologic: Grossly normal   Pelvic: External genitalia:  no lesions              Urethra:  normal appearing urethra with no masses, tenderness or lesions              Bartholins and Skenes: normal                 Vagina: normal appearing vagina with normal color and discharge, no lesions              Cervix: no lesions              Pap taken: No. Bimanual Exam:  Uterus:  normal size, contour, position, consistency, mobility, non-tender              Adnexa: normal adnexa and no mass, fullness, tenderness               Rectovaginal: Confirms               Anus:  normal sphincter tone, no lesions  Chaperone, Terence Lux, CMA, was present for exam.  A:  Well Woman with normal exam PMP, no HRT Prediabetes H/O fatty liver disease Hypertension Hyperlipidemia GERD H/o partial thyroidectomy, now hypothyroidation Low Vit D  P:   Mammogram guidelines reviewed BMD ordered for pt  to do this year pap smear neg with neg HR HPV 2020.  Not indicated today Vaccines reviewed Colonoscopy 2019 return annually or prn

## 2020-05-27 ENCOUNTER — Other Ambulatory Visit: Payer: Self-pay

## 2020-05-27 ENCOUNTER — Encounter (INDEPENDENT_AMBULATORY_CARE_PROVIDER_SITE_OTHER): Payer: Self-pay | Admitting: Physician Assistant

## 2020-05-27 ENCOUNTER — Ambulatory Visit (INDEPENDENT_AMBULATORY_CARE_PROVIDER_SITE_OTHER): Payer: 59 | Admitting: Physician Assistant

## 2020-05-27 VITALS — BP 104/73 | HR 88 | Temp 98.3°F | Ht 63.0 in | Wt 187.0 lb

## 2020-05-27 DIAGNOSIS — R7303 Prediabetes: Secondary | ICD-10-CM | POA: Diagnosis not present

## 2020-05-27 DIAGNOSIS — Z9189 Other specified personal risk factors, not elsewhere classified: Secondary | ICD-10-CM

## 2020-05-27 DIAGNOSIS — E7849 Other hyperlipidemia: Secondary | ICD-10-CM

## 2020-05-27 DIAGNOSIS — Z6833 Body mass index (BMI) 33.0-33.9, adult: Secondary | ICD-10-CM

## 2020-05-27 DIAGNOSIS — E669 Obesity, unspecified: Secondary | ICD-10-CM

## 2020-05-27 MED ORDER — METFORMIN HCL 500 MG PO TABS: 500.0000 mg | ORAL_TABLET | Freq: Every day | ORAL | 0 refills | Status: DC

## 2020-05-27 NOTE — Progress Notes (Signed)
Chief Complaint:   OBESITY Morgan Williams is here to discuss her progress with her obesity treatment plan along with follow-up of her obesity related diagnoses. Morgan Williams is on the Category 2 Plan and states she is following her eating plan approximately 70% of the time. Morgan Williams states she is exercising 0 minutes 0 times per week.  Today's visit was #: 75 Starting weight: 219 lbs Starting date: 09/20/2018 Today's weight: 187 lbs Today's date: 05/27/2020 Total lbs lost to date: 32 Total lbs lost since last in-office visit: 1  Interim History: Morgan Williams reports that she is feeling better since her knee replacement. She is doing a lot of boredom eating due to being at home all day. She returns to work in 2 weeks.  Subjective:   Other hyperlipidemia. Morgan Williams is on Zocor. No chest pain or myalgias.   Lab Results  Component Value Date   CHOL 144 09/05/2019   HDL 41 09/05/2019   LDLCALC 81 09/05/2019   TRIG 121 09/05/2019   CHOLHDL 3.6 09/20/2018   Lab Results  Component Value Date   ALT 19 02/28/2020   AST 21 02/28/2020   ALKPHOS 51 02/28/2020   BILITOT 0.8 02/28/2020   The 10-year ASCVD risk score Morgan Bussing DC Jr., et al., 2013) is: 6.5%   Values used to calculate the score:     Age: 64 years     Sex: Female     Is Non-Hispanic African American: No     Diabetic: Yes     Tobacco smoker: No     Systolic Blood Pressure: 284 mmHg     Is BP treated: Yes     HDL Cholesterol: 41 mg/dL     Total Cholesterol: 144 mg/dL  Prediabetes. Morgan Williams has a diagnosis of prediabetes based on her elevated HgA1c and was informed this puts her at greater risk of developing diabetes. She continues to work on diet and exercise to decrease her risk of diabetes. Morgan Williams is on metformin. No nausea, vomiting, diarrhea, or polyphagia.   Lab Results  Component Value Date   HGBA1C 5.7 (H) 02/28/2020   Lab Results  Component Value Date   INSULIN 8.3 09/05/2019   INSULIN 11.3 05/02/2019   INSULIN 17.2  09/20/2018   At risk for diabetes mellitus. Morgan Williams is at higher than average risk for developing diabetes due to her obesity.   Assessment/Plan:   Other hyperlipidemia. Cardiovascular risk and specific lipid/LDL goals reviewed.  We discussed several lifestyle modifications today and Morgan Williams will continue to work on diet, exercise and weight loss efforts. Orders and follow up as documented in patient record. She will continue her medication as directed.   Counseling Intensive lifestyle modifications are the first line treatment for this issue. . Dietary changes: Increase soluble fiber. Decrease simple carbohydrates. . Exercise changes: Moderate to vigorous-intensity aerobic activity 150 minutes per week if tolerated. . Lipid-lowering medications: see documented in medical record.  Prediabetes. Morgan Williams will continue to work on weight loss, exercise, and decreasing simple carbohydrates to help decrease the risk of diabetes. Refill was given for metFORMIN (GLUCOPHAGE) 500 MG tablet #30 with 0 refills.  At risk for diabetes mellitus. Morgan Williams was given approximately 15 minutes of diabetes education and counseling today. We discussed intensive lifestyle modifications today with an emphasis on weight loss as well as increasing exercise and decreasing simple carbohydrates in her diet. We also reviewed medication options with an emphasis on risk versus benefit of those discussed.   Repetitive spaced learning was employed  today to elicit superior memory formation and behavioral change.  Class 1 obesity with serious comorbidity and body mass index (BMI) of 33.0 to 33.9 in adult, unspecified obesity type.  Morgan Williams is currently in the action stage of change. As such, her goal is to continue with weight loss efforts. She has agreed to keeping a food journal and adhering to recommended goals of 1200 calories and 85 grams of protein daily.   Exercise goals: For substantial health benefits, adults should do at least  150 minutes (2 hours and 30 minutes) a week of moderate-intensity, or 75 minutes (1 hour and 15 minutes) a week of vigorous-intensity aerobic physical activity, or an equivalent combination of moderate- and vigorous-intensity aerobic activity. Aerobic activity should be performed in episodes of at least 10 minutes, and preferably, it should be spread throughout the week.  Behavioral modification strategies: meal planning and cooking strategies and ways to avoid boredom eating.  Morgan Williams has agreed to follow-up with our clinic in 3 weeks. She was informed of the importance of frequent follow-up visits to maximize her success with intensive lifestyle modifications for her multiple health conditions.   Objective:   Blood pressure 104/73, pulse 88, temperature 98.3 F (36.8 C), temperature source Oral, height 5\' 3"  (1.6 m), weight 187 lb (84.8 kg), last menstrual period 11/01/2003, SpO2 97 %. Body mass index is 33.13 kg/m.  General: Cooperative, alert, well developed, in no acute distress. HEENT: Conjunctivae and lids unremarkable. Cardiovascular: Regular rhythm.  Lungs: Normal work of breathing. Neurologic: No focal deficits.   Lab Results  Component Value Date   CREATININE 0.69 02/28/2020   BUN 24 (H) 02/28/2020   NA 139 02/28/2020   K 3.7 02/28/2020   CL 104 02/28/2020   CO2 26 02/28/2020   Lab Results  Component Value Date   ALT 19 02/28/2020   AST 21 02/28/2020   ALKPHOS 51 02/28/2020   BILITOT 0.8 02/28/2020   Lab Results  Component Value Date   HGBA1C 5.7 (H) 02/28/2020   HGBA1C 5.6 09/05/2019   HGBA1C 5.5 05/02/2019   HGBA1C 6.3 (H) 09/20/2018   Lab Results  Component Value Date   INSULIN 8.3 09/05/2019   INSULIN 11.3 05/02/2019   INSULIN 17.2 09/20/2018   Lab Results  Component Value Date   TSH 1.490 05/02/2019   Lab Results  Component Value Date   CHOL 144 09/05/2019   HDL 41 09/05/2019   LDLCALC 81 09/05/2019   TRIG 121 09/05/2019   CHOLHDL 3.6 09/20/2018    Lab Results  Component Value Date   WBC 7.3 02/28/2020   HGB 12.2 02/28/2020   HCT 37.8 02/28/2020   MCV 95.0 02/28/2020   PLT 189 02/28/2020   No results found for: IRON, TIBC, FERRITIN  Attestation Statements:   Reviewed by clinician on day of visit: allergies, medications, problem list, medical history, surgical history, family history, social history, and previous encounter notes.  IMichaelene Song, am acting as transcriptionist for Abby Potash, PA-C   I have reviewed the above documentation for accuracy and completeness, and I agree with the above. Abby Potash, PA-C

## 2020-05-28 ENCOUNTER — Ambulatory Visit (INDEPENDENT_AMBULATORY_CARE_PROVIDER_SITE_OTHER): Payer: 59 | Admitting: Obstetrics & Gynecology

## 2020-05-28 ENCOUNTER — Encounter: Payer: Self-pay | Admitting: Obstetrics & Gynecology

## 2020-05-28 VITALS — BP 110/72 | HR 70 | Resp 16 | Ht 63.5 in | Wt 191.0 lb

## 2020-05-28 DIAGNOSIS — Z01419 Encounter for gynecological examination (general) (routine) without abnormal findings: Secondary | ICD-10-CM

## 2020-05-28 DIAGNOSIS — M8589 Other specified disorders of bone density and structure, multiple sites: Secondary | ICD-10-CM | POA: Diagnosis not present

## 2020-06-02 ENCOUNTER — Other Ambulatory Visit: Payer: Self-pay | Admitting: Obstetrics & Gynecology

## 2020-06-02 DIAGNOSIS — M8589 Other specified disorders of bone density and structure, multiple sites: Secondary | ICD-10-CM

## 2020-06-09 MED FILL — METFORMIN HCL 500 MG TABS: 500 | 30 days supply | Qty: 30 | Fill #0

## 2020-06-16 ENCOUNTER — Ambulatory Visit (INDEPENDENT_AMBULATORY_CARE_PROVIDER_SITE_OTHER): Payer: 59 | Admitting: Physician Assistant

## 2020-06-16 ENCOUNTER — Other Ambulatory Visit: Payer: Self-pay

## 2020-06-16 ENCOUNTER — Encounter (INDEPENDENT_AMBULATORY_CARE_PROVIDER_SITE_OTHER): Payer: Self-pay | Admitting: Physician Assistant

## 2020-06-16 VITALS — BP 114/78 | HR 80 | Temp 98.1°F | Ht 63.0 in | Wt 191.0 lb

## 2020-06-16 DIAGNOSIS — I10 Essential (primary) hypertension: Secondary | ICD-10-CM

## 2020-06-16 DIAGNOSIS — Z6833 Body mass index (BMI) 33.0-33.9, adult: Secondary | ICD-10-CM

## 2020-06-16 DIAGNOSIS — E669 Obesity, unspecified: Secondary | ICD-10-CM | POA: Diagnosis not present

## 2020-06-16 DIAGNOSIS — E7849 Other hyperlipidemia: Secondary | ICD-10-CM

## 2020-06-17 NOTE — Progress Notes (Signed)
Chief Complaint:   OBESITY Morgan Williams is here to discuss her progress with her obesity treatment plan along with follow-up of her obesity related diagnoses. Morgan Williams is on the Category 2 Plan and journaling and states she is following her eating plan approximately 0% of the time. Morgan Williams states she is exercising 0 minutes 0 times per week.  Today's visit was #: 31 Starting weight: 219 lbs Starting date: 09/20/2018 Today's weight: 191 lbs Today's date: 06/16/2020 Total lbs lost to date: 28 Total lbs lost since last in-office visit: 0  Interim History: Morgan Williams states that she was on vacation for a week and when she returned she did not get back on plan.  Subjective:   Essential hypertension with hyperlipidemia. Morgan Williams is on Microzide and losartan. Blood pressure is normal.  BP Readings from Last 3 Encounters:  06/16/20 114/78  05/28/20 110/72  05/27/20 104/73   Lab Results  Component Value Date   CREATININE 0.69 02/28/2020   CREATININE 0.78 09/05/2019   CREATININE 0.70 05/02/2019   Other hyperlipidemia. Morgan Williams is on Zocor. No chest pain or myalgias.    Lab Results  Component Value Date   CHOL 144 09/05/2019   HDL 41 09/05/2019   LDLCALC 81 09/05/2019   TRIG 121 09/05/2019   CHOLHDL 3.6 09/20/2018   Lab Results  Component Value Date   ALT 19 02/28/2020   AST 21 02/28/2020   ALKPHOS 51 02/28/2020   BILITOT 0.8 02/28/2020   The 10-year ASCVD risk score Morgan Bussing DC Jr., et al., 2013) is: 7.8%   Values used to calculate the score:     Age: 64 years     Sex: Female     Is Non-Hispanic African American: No     Diabetic: Yes     Tobacco smoker: No     Systolic Blood Pressure: 774 mmHg     Is BP treated: Yes     HDL Cholesterol: 41 mg/dL     Total Cholesterol: 144 mg/dL  Assessment/Plan:   Essential hypertension with hyperlipidemia. Morgan Williams is working on healthy weight loss and exercise to improve blood pressure control. We will watch for signs of hypotension as  she continues her lifestyle modifications. She will continue her medications as directed.   Other hyperlipidemia. Cardiovascular risk and specific lipid/LDL goals reviewed.  We discussed several lifestyle modifications today and Koren will continue to work on diet, exercise and weight loss efforts. Orders and follow up as documented in patient record. She will continue her medication as directed.   Counseling Intensive lifestyle modifications are the first line treatment for this issue.  Dietary changes: Increase soluble fiber. Decrease simple carbohydrates.  Exercise changes: Moderate to vigorous-intensity aerobic activity 150 minutes per week if tolerated.  Lipid-lowering medications: see documented in medical record.  Class 1 obesity with serious comorbidity and body mass index (BMI) of 33.0 to 33.9 in adult, unspecified obesity type.  Morgan Williams is currently in the action stage of change. As such, her goal is to continue with weight loss efforts. She has agreed to keeping a food journal and adhering to recommended goals of 1200 calories and 85 grams of protein daily.   Exercise goals: For substantial health benefits, adults should do at least 150 minutes (2 hours and 30 minutes) a week of moderate-intensity, or 75 minutes (1 hour and 15 minutes) a week of vigorous-intensity aerobic physical activity, or an equivalent combination of moderate- and vigorous-intensity aerobic activity. Aerobic activity should be performed in episodes of  at least 10 minutes, and preferably, it should be spread throughout the week.  Behavioral modification strategies: planning for success and keeping a strict food journal.  Morgan Williams has agreed to follow-up with our clinic in 3 weeks. She was informed of the importance of frequent follow-up visits to maximize her success with intensive lifestyle modifications for her multiple health conditions.   Objective:   Blood pressure 114/78, pulse 80, temperature 98.1 F (36.7  C), temperature source Oral, height 5\' 3"  (1.6 m), weight 191 lb (86.6 kg), last menstrual period 11/01/2003, SpO2 97 %. Body mass index is 33.83 kg/m.  General: Cooperative, alert, well developed, in no acute distress. HEENT: Conjunctivae and lids unremarkable. Cardiovascular: Regular rhythm.  Lungs: Normal work of breathing. Neurologic: No focal deficits.   Lab Results  Component Value Date   CREATININE 0.69 02/28/2020   BUN 24 (H) 02/28/2020   NA 139 02/28/2020   K 3.7 02/28/2020   CL 104 02/28/2020   CO2 26 02/28/2020   Lab Results  Component Value Date   ALT 19 02/28/2020   AST 21 02/28/2020   ALKPHOS 51 02/28/2020   BILITOT 0.8 02/28/2020   Lab Results  Component Value Date   HGBA1C 5.7 (H) 02/28/2020   HGBA1C 5.6 09/05/2019   HGBA1C 5.5 05/02/2019   HGBA1C 6.3 (H) 09/20/2018   Lab Results  Component Value Date   INSULIN 8.3 09/05/2019   INSULIN 11.3 05/02/2019   INSULIN 17.2 09/20/2018   Lab Results  Component Value Date   TSH 1.490 05/02/2019   Lab Results  Component Value Date   CHOL 144 09/05/2019   HDL 41 09/05/2019   LDLCALC 81 09/05/2019   TRIG 121 09/05/2019   CHOLHDL 3.6 09/20/2018   Lab Results  Component Value Date   WBC 7.3 02/28/2020   HGB 12.2 02/28/2020   HCT 37.8 02/28/2020   MCV 95.0 02/28/2020   PLT 189 02/28/2020   No results found for: IRON, TIBC, FERRITIN  Attestation Statements:   Reviewed by clinician on day of visit: allergies, medications, problem list, medical history, surgical history, family history, social history, and previous encounter notes.  Time spent on visit including pre-visit chart review and post-visit charting and care was 33 minutes.   IMichaelene Song, am acting as transcriptionist for Abby Potash, PA-C   I have reviewed the above documentation for accuracy and completeness, and I agree with the above. Abby Potash, PA-C

## 2020-06-18 MED FILL — GABAPENTIN 300 MG CAPSULE: 300 | 30 days supply | Qty: 30 | Fill #2

## 2020-06-18 MED FILL — OLOPATADINE HCL 0.1% EYE DR: 0.1 | 25 days supply | Qty: 5 | Fill #1

## 2020-06-25 DIAGNOSIS — R7303 Prediabetes: Secondary | ICD-10-CM | POA: Diagnosis not present

## 2020-06-25 DIAGNOSIS — E559 Vitamin D deficiency, unspecified: Secondary | ICD-10-CM | POA: Diagnosis not present

## 2020-06-25 DIAGNOSIS — N182 Chronic kidney disease, stage 2 (mild): Secondary | ICD-10-CM | POA: Diagnosis not present

## 2020-06-25 DIAGNOSIS — I129 Hypertensive chronic kidney disease with stage 1 through stage 4 chronic kidney disease, or unspecified chronic kidney disease: Secondary | ICD-10-CM | POA: Diagnosis not present

## 2020-06-30 DIAGNOSIS — E559 Vitamin D deficiency, unspecified: Secondary | ICD-10-CM | POA: Diagnosis not present

## 2020-06-30 DIAGNOSIS — R7303 Prediabetes: Secondary | ICD-10-CM | POA: Diagnosis not present

## 2020-06-30 DIAGNOSIS — E7800 Pure hypercholesterolemia, unspecified: Secondary | ICD-10-CM | POA: Diagnosis not present

## 2020-06-30 DIAGNOSIS — E039 Hypothyroidism, unspecified: Secondary | ICD-10-CM | POA: Diagnosis not present

## 2020-06-30 DIAGNOSIS — I10 Essential (primary) hypertension: Secondary | ICD-10-CM | POA: Diagnosis not present

## 2020-07-09 ENCOUNTER — Other Ambulatory Visit: Payer: Self-pay

## 2020-07-09 ENCOUNTER — Encounter (INDEPENDENT_AMBULATORY_CARE_PROVIDER_SITE_OTHER): Payer: Self-pay | Admitting: Physician Assistant

## 2020-07-09 ENCOUNTER — Ambulatory Visit (INDEPENDENT_AMBULATORY_CARE_PROVIDER_SITE_OTHER): Payer: 59 | Admitting: Physician Assistant

## 2020-07-09 VITALS — BP 105/70 | HR 66 | Temp 98.0°F | Ht 63.0 in | Wt 192.0 lb

## 2020-07-09 DIAGNOSIS — E559 Vitamin D deficiency, unspecified: Secondary | ICD-10-CM

## 2020-07-09 DIAGNOSIS — Z6834 Body mass index (BMI) 34.0-34.9, adult: Secondary | ICD-10-CM | POA: Diagnosis not present

## 2020-07-09 DIAGNOSIS — E669 Obesity, unspecified: Secondary | ICD-10-CM | POA: Diagnosis not present

## 2020-07-09 DIAGNOSIS — Z9189 Other specified personal risk factors, not elsewhere classified: Secondary | ICD-10-CM | POA: Diagnosis not present

## 2020-07-09 DIAGNOSIS — R7303 Prediabetes: Secondary | ICD-10-CM

## 2020-07-09 MED ORDER — METFORMIN HCL 500 MG PO TABS: 500.0000 mg | ORAL_TABLET | Freq: Every day | ORAL | 0 refills | Status: DC

## 2020-07-09 MED FILL — METFORMIN HCL 500 MG TABS: 500 | 30 days supply | Qty: 30 | Fill #0

## 2020-07-12 NOTE — Progress Notes (Signed)
Chief Complaint:   OBESITY Morgan Williams is here to discuss her progress with her obesity treatment plan along with follow-up of her obesity related diagnoses. Morgan Williams is on the Category 2 Plan and states she is following her eating plan approximately 0% of the time. Morgan Williams states she is walking for 25-30 minutes 7 times per week.  Today's visit was #: 38 Starting weight: 219 lbs Starting date: 09/20/2018 Today's weight: 192 lbs Today's date: 07/09/2020 Total lbs lost to date: 27 Total lbs lost since last in-office visit: 0  Interim History: Morgan Williams has been journaling and realizes that she is going above her calories.  She is hungry throughout the day.  She is traveling to the beach today.  Subjective:   1. Prediabetes Morgan Williams has a diagnosis of prediabetes based on her elevated HgA1c and was informed this puts her at greater risk of developing diabetes. She continues to work on diet and exercise to decrease her risk of diabetes. She denies nausea or hypoglycemia. She is on metformin.  Reports polyphagia.  Lab Results  Component Value Date   HGBA1C 5.7 (H) 02/28/2020   Lab Results  Component Value Date   INSULIN 8.3 09/05/2019   INSULIN 11.3 05/02/2019   INSULIN 17.2 09/20/2018   2. Vitamin D deficiency Morgan Williams's Vitamin D level was 46.0 on 09/05/2019. She is currently taking prescription vitamin D 50,000 IU each week. She denies nausea, vomiting or muscle weakness.  Tolerating well.  3. At risk for diabetes mellitus Morgan Williams is at higher than average risk for developing diabetes due to her obesity.   Assessment/Plan:   1. Prediabetes Morgan Williams will continue to work on weight loss, exercise, and decreasing simple carbohydrates to help decrease the risk of diabetes.   -Refill metFORMIN (GLUCOPHAGE) 500 MG tablet; Take 1 tablet (500 mg total) by mouth daily with supper.  Dispense: 30 tablet; Refill: 0  2. Vitamin D deficiency Low Vitamin D level contributes to fatigue and are associated with  obesity, breast, and colon cancer. She agrees to continue to take prescription Vitamin D @50 ,000 IU every week and will follow-up for routine testing of Vitamin D, at least 2-3 times per year to avoid over-replacement.  3. At risk for diabetes mellitus Morgan Williams was given approximately 15 minutes of diabetes education and counseling today. We discussed intensive lifestyle modifications today with an emphasis on weight loss as well as increasing exercise and decreasing simple carbohydrates in her diet. We also reviewed medication options with an emphasis on risk versus benefit of those discussed.   Repetitive spaced learning was employed today to elicit superior memory formation and behavioral change.  4. Class 1 obesity with serious comorbidity and body mass index (BMI) of 34.0 to 34.9 in adult, unspecified obesity type Morgan Williams is currently in the action stage of change. As such, her goal is to continue with weight loss efforts. She has agreed to keeping a food journal and adhering to recommended goals of 1200 calories and 85 grams of protein.   Exercise goals: For substantial health benefits, adults should do at least 150 minutes (2 hours and 30 minutes) a week of moderate-intensity, or 75 minutes (1 hour and 15 minutes) a week of vigorous-intensity aerobic physical activity, or an equivalent combination of moderate- and vigorous-intensity aerobic activity. Aerobic activity should be performed in episodes of at least 10 minutes, and preferably, it should be spread throughout the week.  Behavioral modification strategies: increasing lean protein intake and planning for success.  Fasting labs and  IC at next visit.  Discussed adding Wegovy.  Will wait until next visit.  Morgan Williams has agreed to follow-up with our clinic in 3 weeks. She was informed of the importance of frequent follow-up visits to maximize her success with intensive lifestyle modifications for her multiple health conditions.   Objective:    Blood pressure 105/70, pulse 66, temperature 98 F (36.7 C), temperature source Oral, height 5\' 3"  (1.6 m), weight 192 lb (87.1 kg), last menstrual period 11/01/2003, SpO2 99 %. Body mass index is 34.01 kg/m.  General: Cooperative, alert, well developed, in no acute distress. HEENT: Conjunctivae and lids unremarkable. Cardiovascular: Regular rhythm.  Lungs: Normal work of breathing. Neurologic: No focal deficits.   Lab Results  Component Value Date   CREATININE 0.69 02/28/2020   BUN 24 (H) 02/28/2020   NA 139 02/28/2020   K 3.7 02/28/2020   CL 104 02/28/2020   CO2 26 02/28/2020   Lab Results  Component Value Date   ALT 19 02/28/2020   AST 21 02/28/2020   ALKPHOS 51 02/28/2020   BILITOT 0.8 02/28/2020   Lab Results  Component Value Date   HGBA1C 5.7 (H) 02/28/2020   HGBA1C 5.6 09/05/2019   HGBA1C 5.5 05/02/2019   HGBA1C 6.3 (H) 09/20/2018   Lab Results  Component Value Date   INSULIN 8.3 09/05/2019   INSULIN 11.3 05/02/2019   INSULIN 17.2 09/20/2018   Lab Results  Component Value Date   TSH 1.490 05/02/2019   Lab Results  Component Value Date   CHOL 144 09/05/2019   HDL 41 09/05/2019   LDLCALC 81 09/05/2019   TRIG 121 09/05/2019   CHOLHDL 3.6 09/20/2018   Lab Results  Component Value Date   WBC 7.3 02/28/2020   HGB 12.2 02/28/2020   HCT 37.8 02/28/2020   MCV 95.0 02/28/2020   PLT 189 02/28/2020   Attestation Statements:   Reviewed by clinician on day of visit: allergies, medications, problem list, medical history, surgical history, family history, social history, and previous encounter notes.  I, Water quality scientist, CMA, am acting as transcriptionist for Abby Potash, PA-C  I have reviewed the above documentation for accuracy and completeness, and I agree with the above. Abby Potash, PA-C

## 2020-07-16 MED FILL — GABAPENTIN 300 MG CAPSULE: 300 | 30 days supply | Qty: 30 | Fill #3

## 2020-07-21 ENCOUNTER — Other Ambulatory Visit (HOSPITAL_COMMUNITY): Payer: Self-pay | Admitting: Family Medicine

## 2020-07-21 MED FILL — FAMOTIDINE 20 MG TABS: 20 | 90 days supply | Qty: 180 | Fill #0

## 2020-07-21 MED FILL — HYDROCHLOROTHIAZIDE 12.5 MG: 12.5 | 90 days supply | Qty: 90 | Fill #0

## 2020-07-21 MED FILL — ALBUTEROL SULFATE HFA 108 (: 108 (90 BAS | 30 days supply | Qty: 18 | Fill #0

## 2020-07-21 MED FILL — SIMVASTATIN 40 MG TABLET: 40 | 90 days supply | Qty: 90 | Fill #0

## 2020-07-21 MED FILL — LOSARTAN POTASSIUM 50 MG TA: 50 | 90 days supply | Qty: 90 | Fill #0

## 2020-07-24 MED FILL — ALPRAZolam 0.5 MG TABS: 0.5 | 30 days supply | Qty: 30 | Fill #0

## 2020-07-28 ENCOUNTER — Other Ambulatory Visit: Payer: Self-pay

## 2020-07-28 ENCOUNTER — Encounter (INDEPENDENT_AMBULATORY_CARE_PROVIDER_SITE_OTHER): Payer: Self-pay | Admitting: Physician Assistant

## 2020-07-28 ENCOUNTER — Ambulatory Visit (INDEPENDENT_AMBULATORY_CARE_PROVIDER_SITE_OTHER): Payer: 59 | Admitting: Physician Assistant

## 2020-07-28 ENCOUNTER — Other Ambulatory Visit (HOSPITAL_COMMUNITY): Payer: Self-pay | Admitting: Student

## 2020-07-28 VITALS — BP 106/71 | HR 64 | Temp 98.4°F | Ht 63.0 in | Wt 192.0 lb

## 2020-07-28 DIAGNOSIS — E669 Obesity, unspecified: Secondary | ICD-10-CM | POA: Diagnosis not present

## 2020-07-28 DIAGNOSIS — Z6834 Body mass index (BMI) 34.0-34.9, adult: Secondary | ICD-10-CM

## 2020-07-28 DIAGNOSIS — Z9189 Other specified personal risk factors, not elsewhere classified: Secondary | ICD-10-CM

## 2020-07-28 DIAGNOSIS — E7849 Other hyperlipidemia: Secondary | ICD-10-CM

## 2020-07-28 DIAGNOSIS — E559 Vitamin D deficiency, unspecified: Secondary | ICD-10-CM | POA: Diagnosis not present

## 2020-07-28 DIAGNOSIS — R0602 Shortness of breath: Secondary | ICD-10-CM | POA: Diagnosis not present

## 2020-07-28 DIAGNOSIS — R7303 Prediabetes: Secondary | ICD-10-CM | POA: Diagnosis not present

## 2020-07-28 DIAGNOSIS — E038 Other specified hypothyroidism: Secondary | ICD-10-CM | POA: Diagnosis not present

## 2020-07-28 MED ORDER — INSULIN PEN NEEDLE 32G X 4 MM MISC: 1.0000 | Freq: Two times a day (BID) | 0 refills | Status: DC

## 2020-07-28 MED ORDER — WEGOVY 0.25 MG/0.5ML ~~LOC~~ SOAJ: 0.2500 mg | SUBCUTANEOUS | 0 refills | Status: DC

## 2020-07-28 MED FILL — WEGOVY 0.25 MG/0.5ML SOAJ: 0.25 | 28 days supply | Qty: 2 | Fill #0

## 2020-07-28 NOTE — Progress Notes (Signed)
Chief Complaint:   OBESITY Angeles is here to discuss her progress with her obesity treatment plan along with follow-up of her obesity related diagnoses. Lyllie is on keeping a food journal and adhering to recommended goals of 1200 calories and 85 grams of protein daily and states she is following her eating plan approximately 80% of the time. Nikisha states she is walking for 45 minutes 3-4 times per week.  Today's visit was #: 70 Starting weight: 219 lbs Starting date: 09/20/2018 Today's weight: 192 lbs Today's date: 07/28/2020 Total lbs lost to date: 27 Total lbs lost since last in-office visit: 0  Interim History: Lakia was at the beach over the last few weeks. We discussed Wegovy during our last visit and after consideration, she would like to start taking it. IC was done today and shows an increase to 1521.  Subjective:   1. Pre-diabetes Edit is on metformin, and endorses polyphagia.  2. Vitamin D deficiency Briani is on Vit D and is walking more outside 3-4 times per week. She is due for labs.  3. Other specified hypothyroidism Rochella is on levothyroxine and she is due for labs. She denies heat/cold intolerance.  4. Other hyperlipidemia Hira is on Zocor, and she is walking regularly.  5. Shortness of breath on exertion Cassidy notes shortness of breath with exertion. She denies dizziness or lightheadedness.  6. At risk for heart disease Elinora is at a higher than average risk for cardiovascular disease due to obesity.   Assessment/Plan:   1. Pre-diabetes Zaynah will continue her medications, and will continue to work on weight loss, exercise, and decreasing simple carbohydrates to help decrease the risk of diabetes. We will check labs today.  - Comprehensive metabolic panel - Hemoglobin A1c - Insulin, random  2. Vitamin D deficiency Low Vitamin D level contributes to fatigue and are associated with obesity, breast, and colon cancer. We will check labs today. Ryanne  agreed to continue taking prescription Vitamin D 50,000 IU every week and will follow-up for routine testing of Vitamin D, at least 2-3 times per year to avoid over-replacement.  - VITAMIN D 25 Hydroxy (Vit-D Deficiency, Fractures)  3. Other specified hypothyroidism Patient with long-standing hypothyroidism. We will check labs today, and Ermal will continue her medications. She appears euthyroid. Orders and follow up as documented in patient record.  Counseling . Good thyroid control is important for overall health. Supratherapeutic thyroid levels are dangerous and will not improve weight loss results. . The correct way to take levothyroxine is fasting, with water, separated by at least 30 minutes from breakfast, and separated by more than 4 hours from calcium, iron, multivitamins, acid reflux medications (PPIs).   - Thyroid Panel With TSH  4. Other hyperlipidemia Cardiovascular risk and specific lipid/LDL goals reviewed. We discussed several lifestyle modifications today. We will check labs today. Starlina will continue her medications, and will continue to work on diet, exercise and weight loss efforts. Orders and follow up as documented in patient record.   Counseling Intensive lifestyle modifications are the first line treatment for this issue. . Dietary changes: Increase soluble fiber. Decrease simple carbohydrates. . Exercise changes: Moderate to vigorous-intensity aerobic activity 150 minutes per week if tolerated. . Lipid-lowering medications: see documented in medical record. . - Lipid panel  5. Shortness of breath on exertion IC was rechecked today, and Chyenne will continue to follow up as directed.  6. At risk for heart disease Janeshia was given approximately 15 minutes of coronary artery  disease prevention counseling today. She is 64 y.o. female and has risk factors for heart disease including obesity. We discussed intensive lifestyle modifications today with an emphasis on specific  weight loss instructions and strategies.   Repetitive spaced learning was employed today to elicit superior memory formation and behavioral change.  7. Class 1 obesity with serious comorbidity and body mass index (BMI) of 34.0 to 34.9 in adult, unspecified obesity type Sholonda is currently in the action stage of change. As such, her goal is to continue with weight loss efforts. She has agreed to keeping a food journal and adhering to recommended goals of 1300 calories and 85 grams of protein daily.   We discussed various medication options to help Siddhi with her weight loss efforts and we both agreed to start Wegovy 0.25 mg SubQ weekly, with no refills, and nano needles #100 with no refills.  - Semaglutide-Weight Management (WEGOVY) 0.25 MG/0.5ML SOAJ; Inject 0.25 mg into the skin once a week. #4 pens  Dispense: 2 mL; Refill: 0 - Insulin Pen Needle 32G X 4 MM MISC; 1 Package by Does not apply route in the morning and at bedtime.  Dispense: 100 each; Refill: 0  Exercise goals: As is.  Behavioral modification strategies: meal planning and cooking strategies.  Tranise has agreed to follow-up with our clinic in 2 weeks. She was informed of the importance of frequent follow-up visits to maximize her success with intensive lifestyle modifications for her multiple health conditions.   Yasmene was informed we would discuss her lab results at her next visit unless there is a critical issue that needs to be addressed sooner. Chesley agreed to keep her next visit at the agreed upon time to discuss these results.  Objective:   Blood pressure 106/71, pulse 64, temperature 98.4 F (36.9 C), height 5\' 3"  (1.6 m), weight 192 lb (87.1 kg), last menstrual period 11/01/2003, SpO2 97 %. Body mass index is 34.01 kg/m.  General: Cooperative, alert, well developed, in no acute distress. HEENT: Conjunctivae and lids unremarkable. Cardiovascular: Regular rhythm.  Lungs: Normal work of breathing. Neurologic: No focal  deficits.   Lab Results  Component Value Date   CREATININE 0.69 02/28/2020   BUN 24 (H) 02/28/2020   NA 139 02/28/2020   K 3.7 02/28/2020   CL 104 02/28/2020   CO2 26 02/28/2020   Lab Results  Component Value Date   ALT 19 02/28/2020   AST 21 02/28/2020   ALKPHOS 51 02/28/2020   BILITOT 0.8 02/28/2020   Lab Results  Component Value Date   HGBA1C 5.7 (H) 02/28/2020   HGBA1C 5.6 09/05/2019   HGBA1C 5.5 05/02/2019   HGBA1C 6.3 (H) 09/20/2018   Lab Results  Component Value Date   INSULIN 8.3 09/05/2019   INSULIN 11.3 05/02/2019   INSULIN 17.2 09/20/2018   Lab Results  Component Value Date   TSH 1.490 05/02/2019   Lab Results  Component Value Date   CHOL 144 09/05/2019   HDL 41 09/05/2019   LDLCALC 81 09/05/2019   TRIG 121 09/05/2019   CHOLHDL 3.6 09/20/2018   Lab Results  Component Value Date   WBC 7.3 02/28/2020   HGB 12.2 02/28/2020   HCT 37.8 02/28/2020   MCV 95.0 02/28/2020   PLT 189 02/28/2020   No results found for: IRON, TIBC, FERRITIN  Attestation Statements:   Reviewed by clinician on day of visit: allergies, medications, problem list, medical history, surgical history, family history, social history, and previous encounter notes.  Wilhemena Durie, am acting as transcriptionist for [employer], PA-C.  I have reviewed the above documentation for accuracy and completeness, and I agree with the above. Abby Potash, PA-C

## 2020-07-29 ENCOUNTER — Other Ambulatory Visit (HOSPITAL_COMMUNITY): Payer: Self-pay | Admitting: Family Medicine

## 2020-07-29 LAB — LIPID PANEL
Chol/HDL Ratio: 3.6 ratio (ref 0.0–4.4)
Cholesterol, Total: 155 mg/dL (ref 100–199)
HDL: 43 mg/dL (ref >39)
LDL Chol Calc (NIH): 87 mg/dL (ref 0–99)
Triglycerides: 142 mg/dL (ref 0–149)
VLDL Cholesterol Cal: 25 mg/dL (ref 5–40)

## 2020-07-29 LAB — COMPREHENSIVE METABOLIC PANEL
ALT: 14 IU/L (ref 0–32)
AST: 14 IU/L (ref 0–40)
Albumin/Globulin Ratio: 2 (ref 1.2–2.2)
Albumin: 4.7 g/dL (ref 3.8–4.8)
Alkaline Phosphatase: 85 IU/L (ref 44–121)
BUN/Creatinine Ratio: 23 (ref 12–28)
BUN: 18 mg/dL (ref 8–27)
Bilirubin Total: 0.5 mg/dL (ref 0.0–1.2)
CO2: 25 mmol/L (ref 20–29)
Calcium: 9.7 mg/dL (ref 8.7–10.3)
Chloride: 104 mmol/L (ref 96–106)
Creatinine, Ser: 0.8 mg/dL (ref 0.57–1.00)
GFR calc Af Amer: 91 mL/min/{1.73_m2} (ref >59)
GFR calc non Af Amer: 79 mL/min/{1.73_m2} (ref >59)
Globulin, Total: 2.4 g/dL (ref 1.5–4.5)
Glucose: 98 mg/dL (ref 65–99)
Potassium: 4.1 mmol/L (ref 3.5–5.2)
Sodium: 144 mmol/L (ref 134–144)
Total Protein: 7.1 g/dL (ref 6.0–8.5)

## 2020-07-29 LAB — HEMOGLOBIN A1C
Est. average glucose Bld gHb Est-mCnc: 120 mg/dL
Hgb A1c MFr Bld: 5.8 % — ABNORMAL HIGH (ref 4.8–5.6)

## 2020-07-29 LAB — THYROID PANEL WITH TSH
Free Thyroxine Index: 1.7 (ref 1.2–4.9)
T3 Uptake Ratio: 29 % (ref 24–39)
T4, Total: 6 ug/dL (ref 4.5–12.0)
TSH: 1.68 u[IU]/mL (ref 0.450–4.500)

## 2020-07-29 LAB — INSULIN, RANDOM: INSULIN: 12.7 u[IU]/mL (ref 2.6–24.9)

## 2020-07-29 LAB — VITAMIN D 25 HYDROXY (VIT D DEFICIENCY, FRACTURES): Vit D, 25-Hydroxy: 72.1 ng/mL (ref 30.0–100.0)

## 2020-07-29 MED FILL — metroNIDAZOLE 0.75 % LOTN: 0.75 | 90 days supply | Qty: 177 | Fill #0

## 2020-08-03 ENCOUNTER — Other Ambulatory Visit (HOSPITAL_COMMUNITY): Payer: Self-pay | Admitting: Internal Medicine

## 2020-08-03 MED FILL — FLUARIX QUADRIVALENT 0.5 ML: 0.5 | 1 days supply | Qty: 1 | Fill #0

## 2020-08-06 ENCOUNTER — Encounter (INDEPENDENT_AMBULATORY_CARE_PROVIDER_SITE_OTHER): Payer: Self-pay

## 2020-08-10 ENCOUNTER — Other Ambulatory Visit: Payer: Self-pay

## 2020-08-10 ENCOUNTER — Encounter (INDEPENDENT_AMBULATORY_CARE_PROVIDER_SITE_OTHER): Payer: Self-pay | Admitting: Physician Assistant

## 2020-08-10 ENCOUNTER — Ambulatory Visit (INDEPENDENT_AMBULATORY_CARE_PROVIDER_SITE_OTHER): Payer: 59 | Admitting: Physician Assistant

## 2020-08-10 VITALS — BP 97/64 | HR 72 | Temp 97.9°F | Ht 63.0 in | Wt 191.0 lb

## 2020-08-10 DIAGNOSIS — E559 Vitamin D deficiency, unspecified: Secondary | ICD-10-CM | POA: Diagnosis not present

## 2020-08-10 DIAGNOSIS — Z9189 Other specified personal risk factors, not elsewhere classified: Secondary | ICD-10-CM

## 2020-08-10 DIAGNOSIS — Z6833 Body mass index (BMI) 33.0-33.9, adult: Secondary | ICD-10-CM

## 2020-08-10 DIAGNOSIS — R7303 Prediabetes: Secondary | ICD-10-CM | POA: Diagnosis not present

## 2020-08-10 DIAGNOSIS — E669 Obesity, unspecified: Secondary | ICD-10-CM | POA: Diagnosis not present

## 2020-08-10 NOTE — Progress Notes (Signed)
Chief Complaint:   OBESITY Morgan Williams is here to discuss her progress with her obesity treatment plan along with follow-up of her obesity related diagnoses. Morgan Williams is keeping a food journal and adhering to recommended goals of 1300 calories and 85 grams of protein and states she is following her eating plan approximately 80% of the time. Morgan Williams states she is exercising 0 minutes 0 times per week.  Today's visit was #: 28 Starting weight: 219 lbs Starting date: 09/20/2018 Today's weight: 191 lbs Today's date: 08/10/2020 Total lbs lost to date: 28 Total lbs lost since last in-office visit: 1  Interim History: Morgan Williams is feeling fuller sooner and is not snacking as much since starting Wegovy. Her hunger is controlled and she is able to get in all of her food.  Subjective:   Vitamin D deficiency. Morgan Williams is taking Vitamin D 5,000 daily. Last level was at goal.   Ref. Range 07/28/2020 09:14  Vitamin D, 25-Hydroxy Latest Ref Range: 30.0 - 100.0 ng/mL 72.1   Pre-diabetes. Morgan Williams has a diagnosis of prediabetes based on her elevated HgA1c and was informed this puts her at greater risk of developing diabetes. She continues to work on diet and exercise to decrease her risk of diabetes. She denies nausea or hypoglycemia. Morgan Williams is not on metformin. She is taking Wegovy and denies polyphagia. Last A1c 5.8, worsened from 5.7.  Lab Results  Component Value Date   HGBA1C 5.8 (H) 07/28/2020   Lab Results  Component Value Date   INSULIN 12.7 07/28/2020   INSULIN 8.3 09/05/2019   INSULIN 11.3 05/02/2019   INSULIN 17.2 09/20/2018   At risk for hypoglycemia. Morgan Williams is at increased risk for hypoglycemia due to changes in diet, diagnosis of diabetes, and/or insulin use.   Assessment/Plan:   Vitamin D deficiency. Low Vitamin D level contributes to fatigue and are associated with obesity, breast, and colon cancer. She will decrease OTC Vitamin D to 4,000 daily and discontinue the 5,000 IU dose.  She will follow-up for routine testing of Vitamin D, at least 2-3 times per year to avoid over-replacement.  Pre-diabetes. Morgan Williams will continue to work on weight loss, exercise, and decreasing simple carbohydrates to help decrease the risk of diabetes. She will continue her medication as directed.   At risk for hypoglycemia. Morgan Williams was given approximately 15 minutes of counseling today regarding prevention of hypoglycemia. She was advised of symptoms of hypoglycemia. Morgan Williams was instructed to avoid skipping meals, eat regular protein rich meals and schedule low calorie snacks as needed.   Repetitive spaced learning was employed today to elicit superior memory formation and behavioral change.  Class 1 obesity with serious comorbidity and body mass index (BMI) of 33.0 to 33.9 in adult, unspecified obesity type. Refill was given for Wegovy, increase to 0.5 mg, #4 with 0 refills.  Morgan Williams is currently in the action stage of change. As such, her goal is to continue with weight loss efforts. She has agreed to keeping a food journal and adhering to recommended goals of 1300 calories and 85 grams of protein daily.   Exercise goals: For substantial health benefits, adults should do at least 150 minutes (2 hours and 30 minutes) a week of moderate-intensity, or 75 minutes (1 hour and 15 minutes) a week of vigorous-intensity aerobic physical activity, or an equivalent combination of moderate- and vigorous-intensity aerobic activity. Aerobic activity should be performed in episodes of at least 10 minutes, and preferably, it should be spread throughout the week.  Behavioral modification strategies: no skipping meals and meal planning and cooking strategies.  Japji has agreed to follow-up with our clinic in 2 weeks. She was informed of the importance of frequent follow-up visits to maximize her success with intensive lifestyle modifications for her multiple health conditions.   Objective:   Blood pressure 97/64,  pulse 72, temperature 97.9 F (36.6 C), height 5\' 3"  (1.6 m), weight 191 lb (86.6 kg), last menstrual period 11/01/2003, SpO2 98 %. Body mass index is 33.83 kg/m.  General: Cooperative, alert, well developed, in no acute distress. HEENT: Conjunctivae and lids unremarkable. Cardiovascular: Regular rhythm.  Lungs: Normal work of breathing. Neurologic: No focal deficits.   Lab Results  Component Value Date   CREATININE 0.80 07/28/2020   BUN 18 07/28/2020   NA 144 07/28/2020   K 4.1 07/28/2020   CL 104 07/28/2020   CO2 25 07/28/2020   Lab Results  Component Value Date   ALT 14 07/28/2020   AST 14 07/28/2020   ALKPHOS 85 07/28/2020   BILITOT 0.5 07/28/2020   Lab Results  Component Value Date   HGBA1C 5.8 (H) 07/28/2020   HGBA1C 5.7 (H) 02/28/2020   HGBA1C 5.6 09/05/2019   HGBA1C 5.5 05/02/2019   HGBA1C 6.3 (H) 09/20/2018   Lab Results  Component Value Date   INSULIN 12.7 07/28/2020   INSULIN 8.3 09/05/2019   INSULIN 11.3 05/02/2019   INSULIN 17.2 09/20/2018   Lab Results  Component Value Date   TSH 1.680 07/28/2020   Lab Results  Component Value Date   CHOL 155 07/28/2020   HDL 43 07/28/2020   LDLCALC 87 07/28/2020   TRIG 142 07/28/2020   CHOLHDL 3.6 07/28/2020   Lab Results  Component Value Date   WBC 7.3 02/28/2020   HGB 12.2 02/28/2020   HCT 37.8 02/28/2020   MCV 95.0 02/28/2020   PLT 189 02/28/2020   No results found for: IRON, TIBC, FERRITIN  Attestation Statements:   Reviewed by clinician on day of visit: allergies, medications, problem list, medical history, surgical history, family history, social history, and previous encounter notes.  IMichaelene Song, am acting as transcriptionist for Abby Potash, PA-C   I have reviewed the above documentation for accuracy and completeness, and I agree with the above. Abby Potash, PA-C

## 2020-08-11 ENCOUNTER — Encounter (INDEPENDENT_AMBULATORY_CARE_PROVIDER_SITE_OTHER): Payer: Self-pay | Admitting: Physician Assistant

## 2020-08-11 DIAGNOSIS — Z6834 Body mass index (BMI) 34.0-34.9, adult: Secondary | ICD-10-CM

## 2020-08-11 DIAGNOSIS — E669 Obesity, unspecified: Secondary | ICD-10-CM

## 2020-08-12 NOTE — Telephone Encounter (Signed)
Send in a script for the 0.5mg  dose please #4 pens

## 2020-08-12 NOTE — Telephone Encounter (Signed)
For [employer]

## 2020-08-13 ENCOUNTER — Other Ambulatory Visit (INDEPENDENT_AMBULATORY_CARE_PROVIDER_SITE_OTHER): Payer: Self-pay | Admitting: Physician Assistant

## 2020-08-13 MED ORDER — WEGOVY 0.5 MG/0.5ML ~~LOC~~ SOAJ: 0.5000 mg | SUBCUTANEOUS | 0 refills | Status: DC

## 2020-08-13 MED FILL — WEGOVY 0.5 MG/0.5ML SOAJ: 0.5 | 28 days supply | Qty: 2 | Fill #0

## 2020-08-17 ENCOUNTER — Encounter (INDEPENDENT_AMBULATORY_CARE_PROVIDER_SITE_OTHER): Payer: Self-pay

## 2020-08-19 MED FILL — GABAPENTIN 300 MG CAPSULE: 300 | 30 days supply | Qty: 30 | Fill #0

## 2020-08-19 MED FILL — OLOPATADINE HCL 0.1 % SOLN: 0.1 | 25 days supply | Qty: 5 | Fill #2

## 2020-08-24 ENCOUNTER — Other Ambulatory Visit: Payer: Self-pay

## 2020-08-24 ENCOUNTER — Other Ambulatory Visit (INDEPENDENT_AMBULATORY_CARE_PROVIDER_SITE_OTHER): Payer: Self-pay | Admitting: Physician Assistant

## 2020-08-24 ENCOUNTER — Encounter (INDEPENDENT_AMBULATORY_CARE_PROVIDER_SITE_OTHER): Payer: Self-pay | Admitting: Physician Assistant

## 2020-08-24 ENCOUNTER — Ambulatory Visit (INDEPENDENT_AMBULATORY_CARE_PROVIDER_SITE_OTHER): Payer: 59 | Admitting: Physician Assistant

## 2020-08-24 VITALS — BP 112/73 | HR 71 | Temp 97.8°F | Ht 63.0 in | Wt 190.0 lb

## 2020-08-24 DIAGNOSIS — Z6833 Body mass index (BMI) 33.0-33.9, adult: Secondary | ICD-10-CM

## 2020-08-24 DIAGNOSIS — E038 Other specified hypothyroidism: Secondary | ICD-10-CM | POA: Diagnosis not present

## 2020-08-24 DIAGNOSIS — Z9189 Other specified personal risk factors, not elsewhere classified: Secondary | ICD-10-CM

## 2020-08-24 DIAGNOSIS — E669 Obesity, unspecified: Secondary | ICD-10-CM | POA: Diagnosis not present

## 2020-08-24 DIAGNOSIS — E559 Vitamin D deficiency, unspecified: Secondary | ICD-10-CM

## 2020-08-24 MED ORDER — LEVOTHYROXINE SODIUM 100 MCG PO TABS: 100.0000 ug | ORAL_TABLET | Freq: Every day | ORAL | 0 refills | Status: DC

## 2020-08-24 MED ORDER — LEVOTHYROXINE SODIUM 88 MCG PO TABS: 88.0000 ug | ORAL_TABLET | Freq: Every day | ORAL | 0 refills | Status: DC

## 2020-08-24 MED FILL — LEVOTHYROXINE 88 MCG TABLET: 88 | 90 days supply | Qty: 90 | Fill #0

## 2020-08-24 MED FILL — LEVOTHYROXINE 100 MCG TABLE: 100 | 90 days supply | Qty: 90 | Fill #0

## 2020-08-26 NOTE — Progress Notes (Signed)
Chief Complaint:   OBESITY Ben Sanz is here to discuss her progress with her obesity treatment plan along with follow-up of her obesity related diagnoses. Jamell is keeping a food journal and adhering to recommended goals of 1200 calories and 85 grams of protein and states she is following her eating plan approximately 75% of the time. Symphany states she is exercising 0 minutes 0 times per week.  Today's visit was #: 28 Starting weight: 219 lbs Starting date: 09/20/2018 Today's weight: 190 lbs Today's date: 08/24/2020 Total lbs lost to date: 29 Total lbs lost since last in-office visit: 1  Interim History: Liliann states that she traveled to [place] for a wedding and thinks she did not eat enough. When she got home, she was hungry and tired. She hasn't been walking. She is traveling again in 2 weeks.  Subjective:   Other specified hypothyroidism. Kimiyah is on levothyroxine and denies excessive fatigue.  Lab Results  Component Value Date   TSH 1.680 07/28/2020   Vitamin D deficiency. Gionna is on Vitamin D supplementation. No nausea, vomiting, or muscle weakness.    Ref. Range 07/28/2020 09:14  Vitamin D, 25-Hydroxy Latest Ref Range: 30.0 - 100.0 ng/mL 72.1   At risk for osteoporosis. Aleda is at higher risk of osteopenia and osteoporosis due to Vitamin D deficiency.   Assessment/Plan:   Other specified hypothyroidism. Patient with long-standing hypothyroidism, on levothyroxine therapy. She appears euthyroid. Orders and follow up as documented in patient record. Refills were given for levothyroxine (SYNTHROID) 100 MCG tablet #90 with 0 refills and levothyroxine (SYNTHROID) 88 MCG tablet #90 with 0 refills.  Counseling  Good thyroid control is important for overall health. Supratherapeutic thyroid levels are dangerous and will not improve weight loss results.  The correct way to take levothyroxine is fasting, with water, separated by at least 30 minutes from breakfast, and  separated by more than 4 hours from calcium, iron, multivitamins, acid reflux medications (PPIs).    Vitamin D deficiency. Low Vitamin D level contributes to fatigue and are associated with obesity, breast, and colon cancer. She agrees to continue to take Vitamin D as directed and will follow-up for routine testing of Vitamin D, at least 2-3 times per year to avoid over-replacement.  At risk for osteoporosis. Kamaree was given approximately 15 minutes of osteoporosis prevention counseling today. Maryn is at risk for osteopenia and osteoporosis due to her Vitamin D deficiency. She was encouraged to take her Vitamin D and follow her higher calcium diet and increase strengthening exercise to help strengthen her bones and decrease her risk of osteopenia and osteoporosis.  Repetitive spaced learning was employed today to elicit superior memory formation and behavioral change.  Class 1 obesity with serious comorbidity and body mass index (BMI) of 33.0 to 33.9 in adult, unspecified obesity type. Ronalee was given a refill on her Wegovy 1 mg #4 with 0 refills.  Caliann is currently in the action stage of change. As such, her goal is to continue with weight loss efforts. She has agreed to keeping a food journal and adhering to recommended goals of 1200-1300 calories and 85 grams of protein daily.   Exercise goals: For substantial health benefits, adults should do at least 150 minutes (2 hours and 30 minutes) a week of moderate-intensity, or 75 minutes (1 hour and 15 minutes) a week of vigorous-intensity aerobic physical activity, or an equivalent combination of moderate- and vigorous-intensity aerobic activity. Aerobic activity should be performed in episodes of at  least 10 minutes, and preferably, it should be spread throughout the week.  Behavioral modification strategies: meal planning and cooking strategies and travel eating strategies.  Allysson has agreed to follow-up with our clinic in 3 weeks. She was informed  of the importance of frequent follow-up visits to maximize her success with intensive lifestyle modifications for her multiple health conditions.   Objective:   Blood pressure 112/73, pulse 71, temperature 97.8 F (36.6 C), temperature source Oral, height 5\' 3"  (1.6 m), weight 190 lb (86.2 kg), last menstrual period 11/01/2003, SpO2 98 %. Body mass index is 33.66 kg/m.  General: Cooperative, alert, well developed, in no acute distress. HEENT: Conjunctivae and lids unremarkable. Cardiovascular: Regular rhythm.  Lungs: Normal work of breathing. Neurologic: No focal deficits.   Lab Results  Component Value Date   CREATININE 0.80 07/28/2020   BUN 18 07/28/2020   NA 144 07/28/2020   K 4.1 07/28/2020   CL 104 07/28/2020   CO2 25 07/28/2020   Lab Results  Component Value Date   ALT 14 07/28/2020   AST 14 07/28/2020   ALKPHOS 85 07/28/2020   BILITOT 0.5 07/28/2020   Lab Results  Component Value Date   HGBA1C 5.8 (H) 07/28/2020   HGBA1C 5.7 (H) 02/28/2020   HGBA1C 5.6 09/05/2019   HGBA1C 5.5 05/02/2019   HGBA1C 6.3 (H) 09/20/2018   Lab Results  Component Value Date   INSULIN 12.7 07/28/2020   INSULIN 8.3 09/05/2019   INSULIN 11.3 05/02/2019   INSULIN 17.2 09/20/2018   Lab Results  Component Value Date   TSH 1.680 07/28/2020   Lab Results  Component Value Date   CHOL 155 07/28/2020   HDL 43 07/28/2020   LDLCALC 87 07/28/2020   TRIG 142 07/28/2020   CHOLHDL 3.6 07/28/2020   Lab Results  Component Value Date   WBC 7.3 02/28/2020   HGB 12.2 02/28/2020   HCT 37.8 02/28/2020   MCV 95.0 02/28/2020   PLT 189 02/28/2020   No results found for: IRON, TIBC, FERRITIN  Attestation Statements:   Reviewed by clinician on day of visit: allergies, medications, problem list, medical history, surgical history, family history, social history, and previous encounter notes.  IMichaelene Song, am acting as transcriptionist for Abby Potash, PA-C   I have reviewed the above  documentation for accuracy and completeness, and I agree with the above. Abby Potash, PA-C

## 2020-08-27 ENCOUNTER — Other Ambulatory Visit (INDEPENDENT_AMBULATORY_CARE_PROVIDER_SITE_OTHER): Payer: Self-pay | Admitting: Physician Assistant

## 2020-08-27 MED ORDER — WEGOVY 1 MG/0.5ML ~~LOC~~ SOAJ: 1.0000 mg | SUBCUTANEOUS | 0 refills | Status: DC

## 2020-08-27 MED FILL — WEGOVY 1 MG/0.5ML SOAJ: 1 | 28 days supply | Qty: 2 | Fill #0

## 2020-09-03 ENCOUNTER — Other Ambulatory Visit: Payer: Self-pay

## 2020-09-03 ENCOUNTER — Ambulatory Visit
Admission: RE | Admit: 2020-09-03 | Discharge: 2020-09-03 | Disposition: A | Discharge status: Home / Self Care | Payer: 59 | Source: Ambulatory Visit | Attending: Obstetrics & Gynecology | Admitting: Obstetrics & Gynecology

## 2020-09-03 DIAGNOSIS — M8589 Other specified disorders of bone density and structure, multiple sites: Secondary | ICD-10-CM

## 2020-09-03 DIAGNOSIS — Z780 Asymptomatic menopausal state: Secondary | ICD-10-CM | POA: Diagnosis not present

## 2020-09-17 ENCOUNTER — Other Ambulatory Visit: Payer: Self-pay

## 2020-09-17 ENCOUNTER — Ambulatory Visit (INDEPENDENT_AMBULATORY_CARE_PROVIDER_SITE_OTHER): Payer: 59 | Admitting: Physician Assistant

## 2020-09-17 ENCOUNTER — Encounter (INDEPENDENT_AMBULATORY_CARE_PROVIDER_SITE_OTHER): Payer: Self-pay | Admitting: Physician Assistant

## 2020-09-17 VITALS — BP 106/69 | HR 73 | Temp 98.2°F | Ht 63.0 in | Wt 189.0 lb

## 2020-09-17 DIAGNOSIS — E7849 Other hyperlipidemia: Secondary | ICD-10-CM

## 2020-09-17 DIAGNOSIS — E669 Obesity, unspecified: Secondary | ICD-10-CM | POA: Diagnosis not present

## 2020-09-17 DIAGNOSIS — I10 Essential (primary) hypertension: Secondary | ICD-10-CM

## 2020-09-17 DIAGNOSIS — Z6833 Body mass index (BMI) 33.0-33.9, adult: Secondary | ICD-10-CM

## 2020-09-18 MED FILL — GABAPENTIN 300 MG CAPSULE: 300 | 30 days supply | Qty: 30 | Fill #1

## 2020-09-22 NOTE — Progress Notes (Signed)
Chief Complaint:   OBESITY Morgan Williams is here to discuss her progress with her obesity treatment plan along with follow-up of her obesity related diagnoses. Morgan Williams is keeping a food journal and adhering to recommended goals of 1300 calories and 90 grams of protein and states she is following her eating plan approximately 70-80% of the time. Morgan Williams states she is walking 2 miles 3 times per week.  Today's visit was #: 9 Starting weight: 219 lbs Starting date: 09/20/2018 Today's weight: 189 lbs Today's date: 09/17/2020 Total lbs lost to date: 30 Total lbs lost since last in-office visit: 1  Interim History: Morgan Williams states that she has been following the plan well and feels like she should have lost more weight. She feels that Morgan Williams is helping curb her appetite some, but not a lot.  Subjective:   Hypertension, unspecified type. Blood pressure is well controlled. No  chest pain or headache.   BP Readings from Last 3 Encounters:  09/17/20 106/69  08/24/20 112/73  08/10/20 97/64   Lab Results  Component Value Date   CREATININE 0.80 07/28/2020   CREATININE 0.69 02/28/2020   CREATININE 0.78 09/05/2019   Other hyperlipidemia. Last lipid panel in normal range. Lacresia is on Zocor. No chest pain or myalgias.    Lab Results  Component Value Date   CHOL 155 07/28/2020   HDL 43 07/28/2020   LDLCALC 87 07/28/2020   TRIG 142 07/28/2020   CHOLHDL 3.6 07/28/2020   Lab Results  Component Value Date   ALT 14 07/28/2020   AST 14 07/28/2020   ALKPHOS 85 07/28/2020   BILITOT 0.5 07/28/2020   The 10-year ASCVD risk score Mikey Bussing DC Jr., et al., 2013) is: 6.9%   Values used to calculate the score:     Age: 64 years     Sex: Female     Is Non-Hispanic African American: No     Diabetic: Yes     Tobacco smoker: No     Systolic Blood Pressure: 294 mmHg     Is BP treated: Yes     HDL Cholesterol: 43 mg/dL     Total Cholesterol: 155 mg/dL  Assessment/Plan:   Hypertension,  unspecified type. Morgan Williams is working on healthy weight loss and exercise to improve blood pressure control. We will watch for signs of hypotension as she continues her lifestyle modifications.  Other hyperlipidemia. Cardiovascular risk and specific lipid/LDL goals reviewed.  We discussed several lifestyle modifications today and Morgan Williams will continue to work on diet, exercise and weight loss efforts. Orders and follow up as documented in patient record. Morgan Williams will continue her medication as directed. Will monitor lipids.  Counseling Intensive lifestyle modifications are the first line treatment for this issue. . Dietary changes: Increase soluble fiber. Decrease simple carbohydrates. . Exercise changes: Moderate to vigorous-intensity aerobic activity 150 minutes per week if tolerated. . Lipid-lowering medications: see documented in medical record.  Class 1 obesity with serious comorbidity and body mass index (BMI) of 33.0 to 33.9 in adult, unspecified obesity type.  Morgan Williams is currently in the action stage of change. As such, her goal is to continue with weight loss efforts. She has agreed to keeping a food journal and adhering to recommended goals of 1200-1300 calories and 85 grams of protein daily.  Meal plan and sample menu were reviewed in detail today.   Exercise goals: For substantial health benefits, adults should do at least 150 minutes (2 hours and 30 minutes) a week of moderate-intensity,  or 75 minutes (1 hour and 15 minutes) a week of vigorous-intensity aerobic physical activity, or an equivalent combination of moderate- and vigorous-intensity aerobic activity. Aerobic activity should be performed in episodes of at least 10 minutes, and preferably, it should be spread throughout the week.  Behavioral modification strategies: meal planning and cooking strategies and keeping healthy foods in the home.  Morgan Williams has agreed to follow-up with our clinic in 3 weeks. She was informed of the importance  of frequent follow-up visits to maximize her success with intensive lifestyle modifications for her multiple health conditions.   Objective:   Blood pressure 106/69, pulse 73, temperature 98.2 F (36.8 C), temperature source Oral, height 5\' 3"  (1.6 m), weight 189 lb (85.7 kg), last menstrual period 11/01/2003, SpO2 99 %. Body mass index is 33.48 kg/m.  General: Cooperative, alert, well developed, in no acute distress. HEENT: Conjunctivae and lids unremarkable. Cardiovascular: Regular rhythm.  Lungs: Normal work of breathing. Neurologic: No focal deficits.   Lab Results  Component Value Date   CREATININE 0.80 07/28/2020   BUN 18 07/28/2020   NA 144 07/28/2020   K 4.1 07/28/2020   CL 104 07/28/2020   CO2 25 07/28/2020   Lab Results  Component Value Date   ALT 14 07/28/2020   AST 14 07/28/2020   ALKPHOS 85 07/28/2020   BILITOT 0.5 07/28/2020   Lab Results  Component Value Date   HGBA1C 5.8 (H) 07/28/2020   HGBA1C 5.7 (H) 02/28/2020   HGBA1C 5.6 09/05/2019   HGBA1C 5.5 05/02/2019   HGBA1C 6.3 (H) 09/20/2018   Lab Results  Component Value Date   INSULIN 12.7 07/28/2020   INSULIN 8.3 09/05/2019   INSULIN 11.3 05/02/2019   INSULIN 17.2 09/20/2018   Lab Results  Component Value Date   TSH 1.680 07/28/2020   Lab Results  Component Value Date   CHOL 155 07/28/2020   HDL 43 07/28/2020   LDLCALC 87 07/28/2020   TRIG 142 07/28/2020   CHOLHDL 3.6 07/28/2020   Lab Results  Component Value Date   WBC 7.3 02/28/2020   HGB 12.2 02/28/2020   HCT 37.8 02/28/2020   MCV 95.0 02/28/2020   PLT 189 02/28/2020   No results found for: IRON, TIBC, FERRITIN  Attestation Statements:   Reviewed by clinician on day of visit: allergies, medications, problem list, medical history, surgical history, family history, social history, and previous encounter notes.  Time spent on visit including pre-visit chart review and post-visit charting and care was 32 minutes.   IMichaelene Song, am acting as transcriptionist for Abby Potash, PA-C   I have reviewed the above documentation for accuracy and completeness, and I agree with the above. Abby Potash, PA-C

## 2020-09-30 ENCOUNTER — Encounter (INDEPENDENT_AMBULATORY_CARE_PROVIDER_SITE_OTHER): Payer: Self-pay | Admitting: Physician Assistant

## 2020-10-01 NOTE — Telephone Encounter (Signed)
Please Review

## 2020-10-06 NOTE — Telephone Encounter (Signed)
Send in a new script for wegovy 1.7mg  once weekly #4 please

## 2020-10-13 ENCOUNTER — Other Ambulatory Visit (HOSPITAL_COMMUNITY): Payer: Self-pay | Admitting: Family Medicine

## 2020-10-13 MED FILL — ALBUTEROL SULFATE HFA 108 (: 108 (90 BAS | 30 days supply | Qty: 18 | Fill #0

## 2020-10-13 MED FILL — FAMOTIDINE 20 MG TABS: 20 | 90 days supply | Qty: 180 | Fill #1

## 2020-10-13 MED FILL — SIMVASTATIN 40 MG TABLET: 40 | 90 days supply | Qty: 90 | Fill #1

## 2020-10-13 MED FILL — LOSARTAN POTASSIUM 50 MG TA: 50 | 90 days supply | Qty: 90 | Fill #1

## 2020-10-13 MED FILL — metroNIDAZOLE 0.75 % LOTN: 0.75 | 90 days supply | Qty: 177 | Fill #1

## 2020-10-13 MED FILL — HYDROCHLOROTHIAZIDE 12.5 MG: 12.5 | 90 days supply | Qty: 90 | Fill #1

## 2020-10-14 ENCOUNTER — Other Ambulatory Visit (HOSPITAL_COMMUNITY): Payer: Self-pay | Admitting: Family Medicine

## 2020-10-14 MED FILL — ALPRAZolam 0.5 MG TABS: 0.5 | 30 days supply | Qty: 30 | Fill #0

## 2020-10-19 ENCOUNTER — Ambulatory Visit (INDEPENDENT_AMBULATORY_CARE_PROVIDER_SITE_OTHER): Payer: 59 | Admitting: Physician Assistant

## 2020-10-19 ENCOUNTER — Encounter (INDEPENDENT_AMBULATORY_CARE_PROVIDER_SITE_OTHER): Payer: Self-pay | Admitting: Physician Assistant

## 2020-10-19 ENCOUNTER — Other Ambulatory Visit (INDEPENDENT_AMBULATORY_CARE_PROVIDER_SITE_OTHER): Payer: Self-pay | Admitting: Physician Assistant

## 2020-10-19 ENCOUNTER — Other Ambulatory Visit: Payer: Self-pay

## 2020-10-19 VITALS — BP 107/69 | HR 74 | Temp 97.8°F | Ht 63.0 in | Wt 186.0 lb

## 2020-10-19 DIAGNOSIS — Z9189 Other specified personal risk factors, not elsewhere classified: Secondary | ICD-10-CM | POA: Diagnosis not present

## 2020-10-19 DIAGNOSIS — R7303 Prediabetes: Secondary | ICD-10-CM

## 2020-10-19 DIAGNOSIS — E559 Vitamin D deficiency, unspecified: Secondary | ICD-10-CM

## 2020-10-19 DIAGNOSIS — E669 Obesity, unspecified: Secondary | ICD-10-CM

## 2020-10-19 DIAGNOSIS — Z6833 Body mass index (BMI) 33.0-33.9, adult: Secondary | ICD-10-CM

## 2020-10-19 MED ORDER — WEGOVY 1 MG/0.5ML ~~LOC~~ SOAJ: 1.0000 mg | SUBCUTANEOUS | 0 refills | Status: DC

## 2020-10-19 MED FILL — GABAPENTIN 300 MG CAPSULE: 300 | 30 days supply | Qty: 30 | Fill #2

## 2020-10-19 NOTE — Progress Notes (Signed)
Chief Complaint:   OBESITY Morgan Williams is here to discuss her progress with her obesity treatment plan along with follow-up of her obesity related diagnoses. Morgan Williams is keeping a food journal and adhering to recommended goals of 1200-1300 calories and 80 grams of protein and states she is following her eating plan approximately 70% of the time. [name] states she is walking 45 minutes 4 times per week.  Today's visit was #: 90 Starting weight: 219 lbs Starting date: 09/20/2018 Today's weight: 186 lbs Today's date: 10/19/2020 Total lbs lost to date: 33 Total lbs lost since last in-office visit: 3  Interim History: Morgan Williams reports that the [name] is helping her eat less and snack less. She is averaging 60 grams of protein, but eating 1300 calories daily.  Subjective:   Pre-diabetes. Morgan Williams has a diagnosis of prediabetes based on her elevated HgA1c and was informed this puts her at greater risk of developing diabetes. She continues to work on diet and exercise to decrease her risk of diabetes. She denies nausea or hypoglycemia. Morgan Williams is on no medication and denies polyphagia. Last A1c 5.8.  Lab Results  Component Value Date   HGBA1C 5.8 (H) 07/28/2020   Lab Results  Component Value Date   INSULIN 12.7 07/28/2020   INSULIN 8.3 09/05/2019   INSULIN 11.3 05/02/2019   INSULIN 17.2 09/20/2018   Vitamin D deficiency. Joliana is on Vitamin D 5,000 units daily. Last level was at goal.   Ref. Range 07/28/2020 09:14  Vitamin D, 25-Hydroxy Latest Ref Range: 30.0 - 100.0 ng/mL 72.1   At risk for diabetes mellitus. [name] is at higher than average risk for developing diabetes due to obesity.   Assessment/Plan:   Pre-diabetes. Morgan Williams will continue to work on weight loss, exercise, and decreasing simple carbohydrates to help decrease the risk of diabetes. Labs will be checked at her next office visit.  Vitamin D deficiency. Low Vitamin D level contributes to fatigue and are associated with  obesity, breast, and colon cancer. She will change to Vitamin D 5,000 units every other day and will follow-up for routine testing of Vitamin D, at least 2-3 times per year to avoid over-replacement.  At risk for diabetes mellitus. [name] was given approximately 15 minutes of diabetes education and counseling today. We discussed intensive lifestyle modifications today with an emphasis on weight loss as well as increasing exercise and decreasing simple carbohydrates in her diet. We also reviewed medication options with an emphasis on risk versus benefit of those discussed.   Repetitive spaced learning was employed today to elicit superior memory formation and behavioral change.  Class 1 obesity with serious comorbidity and body mass index (BMI) of 33.0 to 33.9 in adult, unspecified obesity type. Refill was given for Semaglutide-Weight Management (WEGOVY) 1 MG/0.5ML SOAJ 1 mg #4 with 0 refills.  Labs will be checked at her next office visit.  Morgan Williams is currently in the action stage of change. As such, her goal is to continue with weight loss efforts. She has agreed to keeping a food journal and adhering to recommended goals of 1200-1300 calories and 80 protein daily.   Exercise goals: For substantial health benefits, adults should do at least 150 minutes (2 hours and 30 minutes) a week of moderate-intensity, or 75 minutes (1 hour and 15 minutes) a week of vigorous-intensity aerobic physical activity, or an equivalent combination of moderate- and vigorous-intensity aerobic activity. Aerobic activity should be performed in episodes of at least 10 minutes, and preferably, it  should be spread throughout the week.  Behavioral modification strategies: increasing lean protein intake and meal planning and cooking strategies.  Morgan Williams has agreed to follow-up with our clinic in 3 weeks. She was informed of the importance of frequent follow-up visits to maximize her success with intensive lifestyle modifications for  her multiple health conditions.   Objective:   Blood pressure 107/69, pulse 74, temperature 97.8 F (36.6 C), height 5\' 3"  (1.6 m), weight 186 lb (84.4 kg), last menstrual period 11/01/2003, SpO2 98 %. Body mass index is 32.95 kg/m.  General: Cooperative, alert, well developed, in no acute distress. HEENT: Conjunctivae and lids unremarkable. Cardiovascular: Regular rhythm.  Lungs: Normal work of breathing. Neurologic: No focal deficits.   Lab Results  Component Value Date   CREATININE 0.80 07/28/2020   BUN 18 07/28/2020   NA 144 07/28/2020   K 4.1 07/28/2020   CL 104 07/28/2020   CO2 25 07/28/2020   Lab Results  Component Value Date   ALT 14 07/28/2020   AST 14 07/28/2020   ALKPHOS 85 07/28/2020   BILITOT 0.5 07/28/2020   Lab Results  Component Value Date   HGBA1C 5.8 (H) 07/28/2020   HGBA1C 5.7 (H) 02/28/2020   HGBA1C 5.6 09/05/2019   HGBA1C 5.5 05/02/2019   HGBA1C 6.3 (H) 09/20/2018   Lab Results  Component Value Date   INSULIN 12.7 07/28/2020   INSULIN 8.3 09/05/2019   INSULIN 11.3 05/02/2019   INSULIN 17.2 09/20/2018   Lab Results  Component Value Date   TSH 1.680 07/28/2020   Lab Results  Component Value Date   CHOL 155 07/28/2020   HDL 43 07/28/2020   LDLCALC 87 07/28/2020   TRIG 142 07/28/2020   CHOLHDL 3.6 07/28/2020   Lab Results  Component Value Date   WBC 7.3 02/28/2020   HGB 12.2 02/28/2020   HCT 37.8 02/28/2020   MCV 95.0 02/28/2020   PLT 189 02/28/2020   No results found for: IRON, TIBC, FERRITIN follow up appointment today.  Attestation Statements:   Reviewed by clinician on day of visit: allergies, medications, problem list, medical history, surgical history, family history, social history, and previous encounter notes.  IMichaelene Williams, am acting as transcriptionist for Abby Potash, PA-C   I have reviewed the above documentation for accuracy and completeness, and I agree with the above. Abby Potash, PA-C

## 2020-10-20 MED FILL — WEGOVY 1 MG/0.5ML SOAJ: 1 | 28 days supply | Qty: 2 | Fill #0

## 2020-10-29 ENCOUNTER — Telehealth: Payer: 59 | Admitting: Physician Assistant

## 2020-10-29 DIAGNOSIS — R1110 Vomiting, unspecified: Secondary | ICD-10-CM | POA: Diagnosis not present

## 2020-10-29 DIAGNOSIS — R197 Diarrhea, unspecified: Secondary | ICD-10-CM

## 2020-10-29 DIAGNOSIS — R109 Unspecified abdominal pain: Secondary | ICD-10-CM | POA: Diagnosis not present

## 2020-10-29 NOTE — Progress Notes (Signed)
Hi Morgan Williams,   I am sorry you are feeling so bad. I am concerned your symptoms are not improving after several days.  You should have a physical exam, and may need lab work.  Unfortunately your condition is too complex to take care of via an E-visit  Based on what you shared with me, I feel your condition warrants further evaluation and I recommend that you be seen for a face to face office visit.   NOTE: If you entered your credit card information for this eVisit, you will not be charged. You may see a "hold" on your card for the $35 but that hold will drop off and you will not have a charge processed.   If you are having a true medical emergency please call 911.      For an urgent face to face visit, [place] has five urgent care centers for your convenience:     [facility] at [facility] Directions [phone] [address] [place], [place] [address] . 10 am - 6pm Monday - Friday    [facility]gent [facility] Center [facility]) Get Driving Directions [phone] [address] [place], [place] [address] . 10 am to 8 pm Monday-Friday . 12 pm to 8 pm [facility] at [facility] Get Driving Directions [phone] [address], [address] [place], [place] [address] . 8 am to 8 pm Monday-Friday . 9 am to 6 pm Saturday . 11 am to 6 pm Sunday     [facility] at [facility] Get Driving Directions  [phone] [address] [place], [place] [address] . 8 am to 8 pm Monday-Friday . 8 am to 4 pm [facility] Ur[facility] Care at [facility] Directions [phone] [address] [place], [place] [address] . 12 pm to 6 pm Monday-Friday      Your e-visit answers were reviewed by a board certified advanced clinical practitioner to complete your personal care plan.  Thank you for using e-Visits.

## 2020-11-04 ENCOUNTER — Other Ambulatory Visit: Payer: Self-pay | Admitting: Gastroenterology

## 2020-11-04 ENCOUNTER — Encounter: Payer: Self-pay | Admitting: Gastroenterology

## 2020-11-04 ENCOUNTER — Ambulatory Visit (INDEPENDENT_AMBULATORY_CARE_PROVIDER_SITE_OTHER): Payer: 59 | Admitting: Gastroenterology

## 2020-11-04 VITALS — BP 110/64 | HR 72 | Ht 63.0 in | Wt 188.2 lb

## 2020-11-04 DIAGNOSIS — K219 Gastro-esophageal reflux disease without esophagitis: Secondary | ICD-10-CM

## 2020-11-04 DIAGNOSIS — R112 Nausea with vomiting, unspecified: Secondary | ICD-10-CM | POA: Diagnosis not present

## 2020-11-04 DIAGNOSIS — R1013 Epigastric pain: Secondary | ICD-10-CM

## 2020-11-04 DIAGNOSIS — R197 Diarrhea, unspecified: Secondary | ICD-10-CM

## 2020-11-04 MED ORDER — PANTOPRAZOLE SODIUM 40 MG PO TBEC: 40.0000 mg | DELAYED_RELEASE_TABLET | Freq: Every day | ORAL | 1 refills | Status: DC

## 2020-11-04 MED FILL — PANTOPRAZOLE SOD DR 40 MG T: 40 | 30 days supply | Qty: 30 | Fill #0

## 2020-11-04 NOTE — Patient Instructions (Signed)
If you are age 63 or older, your body mass index should be between 23-30. Your Body mass index is 33.35 kg/m. If this is out of the aforementioned range listed, please consider follow up with your Primary Care Provider.  If you are age 53 or younger, your body mass index should be between 19-25. Your Body mass index is 33.35 kg/m. If this is out of the aformentioned range listed, please consider follow up with your Primary Care Provider.   We have sent the following medications to your pharmacy for you to pick up at your convenience: Pantoprazole 40 mg   Continue to take your align probiotic.  Thank you for choosing me and [facility].  Doug Sou, PA-C

## 2020-11-04 NOTE — Progress Notes (Signed)
11/04/2020 Morgan Williams [id number] [birth year]   HISTORY OF PRESENT ILLNESS: This is a pleasant 65 year old female who is a patient of Dr. Blanch Media. She is a Software engineer at [employer]. She is here today with complaints of sudden onset epigastric abdominal pain followed by nausea, vomiting, and severe diarrhea as well as GERD symptoms and burning in her chest. She says that she was in her normal state of health and feeling well. She went for a walk on Christmas day and had sudden onset of this epigastric abdominal pain which was followed shortly by all the other symptoms. She was having profuse diarrhea. She went to an [facility] walk-in clinic here in [place] where they apparently did an abdominal x-ray and advised her to remain hydrated, etc. She has started to feel better. She no longer has epigastric abdominal pain and has had no further vomiting. She had her first solid bowel movement this morning. She does still have some burning sensation in her chest.  In regards to acid reflux she is on Pepcid 20 mg twice daily. She tells me that she had been on pantoprazole for a long time in the past, but her nephrologist discontinued it. She says that she is sure there is probably no issues with taking it again for short period of time if needed.  Cr is normal.  Colonoscopy 12/2017 was normal with normal random biopsies.   Past Medical History:  Diagnosis Date  . Allergy   . Anxiety   . Aortic atherosclerosis ([place])   . Arthritis   . Asthma   . Depression   . Eczema   . Fatty liver   . GERD (gastroesophageal reflux disease)   . Glaucoma   . Heart murmur   . Hyperlipidemia   . Hypertension   . Hypertensive retinopathy   . Hypothyroidism   . Insomnia   . Kidney problem   . Obesity   . Osteoarthritis   . Osteopenia   . Prediabetes   . Rosacea   . Thyroid disease   . Vitamin D deficiency   . Vocal cord nodules    Past Surgical History:  Procedure Laterality  Date  . CESAREAN SECTION     x 1  . MICROLARYNGOSCOPY Left 11/06/2017   Procedure: MICRO DIRECT LARYNGOSCOPY REMOVAL OF VOCAL CORD;  Surgeon: Jodi Marble, MD;  Location: [facility];  Service: ENT;  Laterality: Left;  . THYROIDECTOMY, PARTIAL  1975   due to Grave's disease  . TOTAL KNEE ARTHROPLASTY Left 03/09/2020   Procedure: TOTAL KNEE ARTHROPLASTY;  Surgeon: Gaynelle Arabian, MD;  Location: [facility];  Service: Orthopedics;  Laterality: Left;    reports that she quit smoking about 22 years ago. Her smoking use included cigarettes. She quit after 25.00 years of use. She has never used smokeless tobacco. She reports current alcohol use of about 2.0 standard drinks of alcohol per week. She reports that she does not use drugs. family history includes Colon cancer (age of onset: 39) in her father; Diabetes in her mother; Heart disease in her father; Hyperlipidemia in her mother; Hypertension in her father and mother; Kidney disease in her father; Obesity in her father; Sleep apnea in her father; Thyroid disease in her daughter and mother. Allergies  Allergen Reactions  . Sulfa Antibiotics Rash      Outpatient Encounter Medications as of 11/04/2020  Medication Sig  . acetaminophen (TYLENOL) 500 MG tablet Take 500 mg by mouth every 6 (six) hours  as needed for moderate pain.   Marland Kitchen albuterol (VENTOLIN HFA) 108 (90 Base) MCG/ACT inhaler Inhale 1 puff into the lungs every 6 (six) hours as needed for wheezing or shortness of breath.  . ALPRAZolam (XANAX) 0.5 MG tablet Take 0.5 mg by mouth at bedtime as needed for anxiety.  . cetirizine (ZYRTEC) 10 MG tablet Take 10 mg by mouth daily. At bedtime  . Cholecalciferol (VITAMIN D3) 1.25 MG (50000 UT) CAPS SMARTSIG:By Mouth  . famotidine (PEPCID) 20 MG tablet Take 20 mg by mouth 2 (two) times daily.  Marland Kitchen gabapentin (NEURONTIN) 300 MG capsule Take a 300 mg capsule three times a day for two weeks following surgery.Then take a 300 mg capsule two times a  day for two weeks. Then take a 300 mg capsule once a day for two weeks. Then discontinue. (Patient taking differently: Take 300 mg by mouth at bedtime.)  . hydrochlorothiazide (MICROZIDE) 12.5 MG capsule Take 12.5 mg by mouth daily.  . Insulin Pen Needle 32G X 4 MM MISC 1 Package by Does not apply route in the morning and at bedtime.  Marland Kitchen levothyroxine (SYNTHROID) 100 MCG tablet Take 1 tablet (100 mcg total) by mouth daily.  Marland Kitchen levothyroxine (SYNTHROID) 88 MCG tablet Take 1 tablet (88 mcg total) by mouth daily before breakfast.  . losartan (COZAAR) 50 MG tablet Take 50 mg by mouth daily.  Marland Kitchen METRONIDAZOLE, TOPICAL, 0.75 % LOTN Apply 1 application topically daily.  Marland Kitchen olopatadine (PATANOL) 0.1 % ophthalmic solution olopatadine 0.1 % eye drops  INSTILL 1 DROP BOTH EYES TWICE A DAY  . Semaglutide-Weight Management (WEGOVY) 1 MG/0.5ML SOAJ Inject 1 mg into the skin once a week.  . simvastatin (ZOCOR) 40 MG tablet Take 40 mg by mouth daily.  Marland Kitchen amoxicillin (AMOXIL) 500 MG capsule amoxicillin 500 mg capsule  TAKE 4 CAPSULES BY MOUTH 1 HOUR PRIOR TO DENTAL PROCEDURE (Patient not taking: No sig reported)  . ondansetron (ZOFRAN-ODT) 8 MG disintegrating tablet Take 8 mg by mouth 3 (three) times daily. (Patient not taking: Reported on 11/04/2020)   No facility-administered encounter medications on file as of 11/04/2020.     REVIEW OF SYSTEMS  : All other systems reviewed and negative except where noted in the History of Present Illness.   PHYSICAL EXAM: BP 110/64 (BP Location: Left Arm, Patient Position: Sitting, Cuff Size: Normal)   Pulse 72   Ht 5\' 3"  (1.6 m) Comment: height measured without shoes  Wt 188 lb 4 oz (85.4 kg)   LMP 11/01/2003 (Approximate)   BMI 33.35 kg/m  General: Well developed white female in no acute distress Head: Normocephalic and atraumatic Eyes:  Sclerae anicteric, conjunctiva pink. Ears: Normal auditory acuity Lungs: Clear throughout to auscultation; no W/R/R. Heart: Regular  rate and rhythm; no M/R/G. Abdomen: Soft, non-distended.  BS present.  Non-tender. Musculoskeletal: Symmetrical with no gross deformities  Skin: No lesions on visible extremities Extremities: No edema  Neurological: Alert oriented x 4, grossly non-focal Psychological:  Alert and cooperative. Normal mood and affect  ASSESSMENT AND PLAN: *65 year old female with complaints of sudden onset epigastric abdominal pain followed by vomiting and diarrhea for close to 10 days. Her symptoms certainly sound infectious in etiology. Duration is a little on the longer side. At this point she is feeling better. No further abdominal pain and she had a solid bowel movement this morning. She also complained of severe acid reflux and burning in her esophagus. This could have been a consequence of all the vomiting. She does  still have some burning sensation. She is only on Pepcid 20 mg twice daily for her acid reflux. We agreed to discontinue that and have her do pantoprazole 40 mg daily for the next 2 or 3 weeks to see if that helps calm things down. She also started align probiotic recently and I encouraged her to continue that for now. She will slowly introduce foods back into her diet beginning with bland type foods.  She will call back for any recurrent symptoms.   CC:  Leighton Ruff, MD

## 2020-11-04 NOTE — Progress Notes (Signed)
Assessment and plan reviewed 

## 2020-11-11 ENCOUNTER — Encounter (INDEPENDENT_AMBULATORY_CARE_PROVIDER_SITE_OTHER): Payer: Self-pay

## 2020-11-12 ENCOUNTER — Other Ambulatory Visit (INDEPENDENT_AMBULATORY_CARE_PROVIDER_SITE_OTHER): Payer: Self-pay | Admitting: Physician Assistant

## 2020-11-12 ENCOUNTER — Encounter (INDEPENDENT_AMBULATORY_CARE_PROVIDER_SITE_OTHER): Payer: Self-pay | Admitting: Physician Assistant

## 2020-11-12 ENCOUNTER — Telehealth (INDEPENDENT_AMBULATORY_CARE_PROVIDER_SITE_OTHER): Payer: 59 | Admitting: Physician Assistant

## 2020-11-12 DIAGNOSIS — E669 Obesity, unspecified: Secondary | ICD-10-CM

## 2020-11-12 DIAGNOSIS — R7303 Prediabetes: Secondary | ICD-10-CM | POA: Diagnosis not present

## 2020-11-12 DIAGNOSIS — Z6833 Body mass index (BMI) 33.0-33.9, adult: Secondary | ICD-10-CM

## 2020-11-12 DIAGNOSIS — E038 Other specified hypothyroidism: Secondary | ICD-10-CM | POA: Diagnosis not present

## 2020-11-12 MED ORDER — WEGOVY 0.5 MG/0.5ML ~~LOC~~ SOAJ: 0.5000 mg | SUBCUTANEOUS | 0 refills | Status: DC

## 2020-11-12 NOTE — Progress Notes (Unsigned)
TeleHealth Visit:  Due to the COVID-19 pandemic, this visit was completed with telemedicine (audio/video) technology to reduce patient and provider exposure as well as to preserve personal protective equipment.   Morgan Williams has verbally consented to this TeleHealth visit. The patient is located at home, the provider is located at the [facility] office. The participants in this visit include the listed provider and patient. The visit was conducted today via MyChart video.   Chief Complaint: OBESITY Morgan Williams is here to discuss her progress with her obesity treatment plan along with follow-up of her obesity related diagnoses. Morgan Williams is on keeping a food journal and adhering to recommended goals of 1200-1300 calories and 80 grams of protein daily and states she is following her eating plan approximately 0% of the time. Morgan Williams states she is doing 0 minutes 0 times per week.  Today's visit was #: 44 Starting weight: 219 lbs Starting date: 09/20/2018  Interim History: Morgan Williams recently had a few days of abdominal pain, nausea, and diarrhea. She went to an urgent care and was diagnosed with a GI virus. During that time she could not eat without vomiting. She has not taken [facility] since December 25th.  Subjective:   1. Pre-diabetes Morgan Williams is not on medications, and her last A1c was 5.8.   2. Other specified hypothyroidism Morgan Williams is managed by her primary care physician. Last thyroid panel was at goal. She is on levothyroxine, and she denies excessive heat or cold intolerance.  Assessment/Plan:   1. Pre-diabetes Morgan Williams will continue her meal plan, and will continue to work on weight loss, exercise, and decreasing simple carbohydrates to help decrease the risk of diabetes.    2. Other specified hypothyroidism Patient with long-standing hypothyroidism. Morgan Williams will continue levothyroxine, and she will follow up with her primary care physician. Orders and follow up as documented in patient  record.  Counseling . Good thyroid control is important for overall health. Supratherapeutic thyroid levels are dangerous and will not improve weight loss results. . The correct way to take levothyroxine is fasting, with water, separated by at least 30 minutes from breakfast, and separated by more than 4 hours from calcium, iron, multivitamins, acid reflux medications (PPIs).   3.Class 1 obesity with serious comorbidity and body mass index (BMI) of 33.0 to 33.9 in adult, unspecified obesity type Morgan Williams is currently in the action stage of change. As such, her goal is to continue with weight loss efforts. She has agreed to the Category 2 Plan.   We discussed various medication options to help Morgan Williams with her weight loss efforts and we both agreed to change Wegovy to 0.5 mg #4 with no refills.  - Semaglutide-Weight Management (WEGOVY) 0.5 MG/0.5ML SOAJ; Inject 0.5 mg into the skin once a week.  Dispense: 2 mL; Refill: 0  Exercise goals: No exercise has been prescribed at this time.  Behavioral modification strategies: meal planning and cooking strategies and keeping healthy foods in the home.  Morgan Williams has agreed to follow-up with our clinic in 2 to 3 weeks. She was informed of the importance of frequent follow-up visits to maximize her success with intensive lifestyle modifications for her multiple health conditions.  Objective:   VITALS: Per patient if applicable, see vitals. GENERAL: Alert and in no acute distress. CARDIOPULMONARY: No increased WOB. Speaking in clear sentences.  PSYCH: Pleasant and cooperative. Speech normal rate and rhythm. Affect is appropriate. Insight and judgement are appropriate. Attention is focused, linear, and appropriate.  NEURO: Oriented as arrived  to appointment on time with no prompting.   Lab Results  Component Value Date   CREATININE 0.80 07/28/2020   BUN 18 07/28/2020   NA 144 07/28/2020   K 4.1 07/28/2020   CL 104 07/28/2020   CO2 25 07/28/2020   Lab  Results  Component Value Date   ALT 14 07/28/2020   AST 14 07/28/2020   ALKPHOS 85 07/28/2020   BILITOT 0.5 07/28/2020   Lab Results  Component Value Date   HGBA1C 5.8 (H) 07/28/2020   HGBA1C 5.7 (H) 02/28/2020   HGBA1C 5.6 09/05/2019   HGBA1C 5.5 05/02/2019   HGBA1C 6.3 (H) 09/20/2018   Lab Results  Component Value Date   INSULIN 12.7 07/28/2020   INSULIN 8.3 09/05/2019   INSULIN 11.3 05/02/2019   INSULIN 17.2 09/20/2018   Lab Results  Component Value Date   TSH 1.680 07/28/2020   Lab Results  Component Value Date   CHOL 155 07/28/2020   HDL 43 07/28/2020   LDLCALC 87 07/28/2020   TRIG 142 07/28/2020   CHOLHDL 3.6 07/28/2020   Lab Results  Component Value Date   WBC 7.3 02/28/2020   HGB 12.2 02/28/2020   HCT 37.8 02/28/2020   MCV 95.0 02/28/2020   PLT 189 02/28/2020   No results found for: IRON, TIBC, FERRITIN  Attestation Statements:   Reviewed by clinician on day of visit: allergies, medications, problem list, medical history, surgical history, family history, social history, and previous encounter notes.   Wilhemena Durie, am acting as transcriptionist for [employer], PA-C.  I have reviewed the above documentation for accuracy and completeness, and I agree with the above. Abby Potash, PA-C

## 2020-11-13 ENCOUNTER — Encounter: Attending: Family Medicine | Primary: Family Medicine

## 2020-11-13 MED FILL — WEGOVY 0.5 MG/0.5ML SOAJ: 0.5 | 28 days supply | Qty: 2 | Fill #0

## 2020-11-30 ENCOUNTER — Other Ambulatory Visit: Payer: Self-pay | Admitting: Obstetrics & Gynecology

## 2020-11-30 DIAGNOSIS — Z1231 Encounter for screening mammogram for malignant neoplasm of breast: Secondary | ICD-10-CM

## 2020-12-01 ENCOUNTER — Other Ambulatory Visit (INDEPENDENT_AMBULATORY_CARE_PROVIDER_SITE_OTHER): Payer: Self-pay | Admitting: Physician Assistant

## 2020-12-01 ENCOUNTER — Encounter (INDEPENDENT_AMBULATORY_CARE_PROVIDER_SITE_OTHER): Payer: Self-pay | Admitting: Physician Assistant

## 2020-12-01 ENCOUNTER — Ambulatory Visit (INDEPENDENT_AMBULATORY_CARE_PROVIDER_SITE_OTHER): Payer: 59 | Admitting: Physician Assistant

## 2020-12-01 ENCOUNTER — Other Ambulatory Visit: Payer: Self-pay

## 2020-12-01 VITALS — BP 114/72 | HR 68 | Temp 97.7°F | Ht 63.0 in | Wt 185.0 lb

## 2020-12-01 DIAGNOSIS — I159 Secondary hypertension, unspecified: Secondary | ICD-10-CM

## 2020-12-01 DIAGNOSIS — E669 Obesity, unspecified: Secondary | ICD-10-CM | POA: Diagnosis not present

## 2020-12-01 DIAGNOSIS — Z6833 Body mass index (BMI) 33.0-33.9, adult: Secondary | ICD-10-CM

## 2020-12-01 DIAGNOSIS — Z9189 Other specified personal risk factors, not elsewhere classified: Secondary | ICD-10-CM

## 2020-12-01 MED ORDER — WEGOVY 0.5 MG/0.5ML ~~LOC~~ SOAJ: 0.5000 mg | SUBCUTANEOUS | 0 refills | Status: DC

## 2020-12-02 NOTE — Progress Notes (Unsigned)
Chief Complaint:   OBESITY Morgan Williams is here to discuss her progress with her obesity treatment plan along with follow-up of her obesity related diagnoses. Morgan Williams is on the Category 2 Plan and states she is following her eating plan approximately 50% of the time. Morgan Williams states she is doing 0 minutes 0 times per week.  Today's visit was #: 40 Starting weight: 219 lbs Starting date: 09/20/2018 Today's weight: 185 lbs Today's date: 12/01/2020 Total lbs lost to date: 34 Total lbs lost since last in-office visit: 1  Interim History: Morgan Williams just returned from vacation and she ate and drank what she wanted. She is not yet back on plan. She believes Wegovy helped her eat less.  Subjective:   1. Secondary hypertension Morgan Williams's blood pressure is very well controlled on Cozaar and hydrochlorothiazide. She denies chest pain or headache. She is managed by her primary care physician.  2. At risk for heart disease Morgan Williams is at a higher than average risk for cardiovascular disease due to obesity.   Assessment/Plan:   1. Secondary hypertension Morgan Williams will continue her medications, and will follow up with her primary care physician. She will continue working on healthy weight loss and exercise to improve blood pressure control. We will watch for signs of hypotension as she continues her lifestyle modifications.  2. At risk for heart disease Morgan Williams was given approximately 15 minutes of coronary artery disease prevention counseling today. She is 65 y.o. female and has risk factors for heart disease including obesity. We discussed intensive lifestyle modifications today with an emphasis on specific weight loss instructions and strategies.   Repetitive spaced learning was employed today to elicit superior memory formation and behavioral change.  3. Class 1 obesity with serious comorbidity and body mass index (BMI) of 33.0 to 33.9 in adult, unspecified obesity type Morgan Williams is currently in the action stage of  change. As such, her goal is to continue with weight loss efforts. She has agreed to the Category 2 Plan.   We discussed various medication options to help Morgan Williams with her weight loss efforts and we both agreed to continue [place], and we will refill for 1 month.  - Semaglutide-Weight Management (WEGOVY) 0.5 MG/0.5ML SOAJ; Inject 0.5 mg into the skin once a week.  Dispense: 2 mL; Refill: 0  We will recheck labs at her next visit.  Exercise goals: No exercise has been prescribed at this time.  Behavioral modification strategies: meal planning and cooking strategies and keeping healthy foods in the home.  Morgan Williams has agreed to follow-up with our clinic in 2 weeks. She was informed of the importance of frequent follow-up visits to maximize her success with intensive lifestyle modifications for her multiple health conditions.   Objective:   Blood pressure 114/72, pulse 68, temperature 97.7 F (36.5 C), height 5\' 3"  (1.6 m), weight 185 lb (83.9 kg), last menstrual period 11/01/2003, SpO2 100 %. Body mass index is 32.77 kg/m.  General: Cooperative, alert, well developed, in no acute distress. HEENT: Conjunctivae and lids unremarkable. Cardiovascular: Regular rhythm.  Lungs: Normal work of breathing. Neurologic: No focal deficits.   Lab Results  Component Value Date   CREATININE 0.80 07/28/2020   BUN 18 07/28/2020   NA 144 07/28/2020   K 4.1 07/28/2020   CL 104 07/28/2020   CO2 25 07/28/2020   Lab Results  Component Value Date   ALT 14 07/28/2020   AST 14 07/28/2020   ALKPHOS 85 07/28/2020   BILITOT 0.5 07/28/2020  Lab Results  Component Value Date   HGBA1C 5.8 (H) 07/28/2020   HGBA1C 5.7 (H) 02/28/2020   HGBA1C 5.6 09/05/2019   HGBA1C 5.5 05/02/2019   HGBA1C 6.3 (H) 09/20/2018   Lab Results  Component Value Date   INSULIN 12.7 07/28/2020   INSULIN 8.3 09/05/2019   INSULIN 11.3 05/02/2019   INSULIN 17.2 09/20/2018   Lab Results  Component Value Date   TSH 1.680  07/28/2020   Lab Results  Component Value Date   CHOL 155 07/28/2020   HDL 43 07/28/2020   LDLCALC 87 07/28/2020   TRIG 142 07/28/2020   CHOLHDL 3.6 07/28/2020   Lab Results  Component Value Date   WBC 7.3 02/28/2020   HGB 12.2 02/28/2020   HCT 37.8 02/28/2020   MCV 95.0 02/28/2020   PLT 189 02/28/2020   No results found for: IRON, TIBC, FERRITIN  Attestation Statements:   Reviewed by clinician on day of visit: allergies, medications, problem list, medical history, surgical history, family history, social history, and previous encounter notes.   Wilhemena Durie, am acting as transcriptionist for [employer], PA-C.  I have reviewed the above documentation for accuracy and completeness, and I agree with the above. Abby Potash, PA-C

## 2020-12-08 ENCOUNTER — Encounter

## 2020-12-08 ENCOUNTER — Ambulatory Visit: Admit: 2020-12-08 | Discharge: 2020-12-08 | Payer: MEDICAID | Attending: Family Medicine | Primary: Family Medicine

## 2020-12-08 DIAGNOSIS — K219 Gastro-esophageal reflux disease without esophagitis: Secondary | ICD-10-CM

## 2020-12-08 LAB — LIPID PANEL
Chol/HDL Ratio: 3 NA
Cholesterol: 293 mg/dL — AB (ref <200)
HDL: 103 mg/dL — ABNORMAL HIGH (ref 40–60)
LDL Cholesterol: 164 mg/dL — AB (ref <100)
Triglycerides: 131 mg/dL (ref <150)

## 2020-12-08 MED ORDER — FAMOTIDINE 20 MG PO TABS
20 MG | ORAL_TABLET | Freq: Two times a day (BID) | ORAL | 3 refills | Status: DC
Start: 2020-12-08 — End: 2021-04-13

## 2020-12-08 NOTE — Assessment & Plan Note (Signed)
Refill on famotidine 20 mg tablet BID as needed for acid reflux.

## 2020-12-08 NOTE — Assessment & Plan Note (Signed)
Patient uses HRT and is doing well. Patient is to continue the Estring 2 mg vaginal ring every 90 days she can replace. Premarin 0.625mg  vaginal cream as needed.

## 2020-12-08 NOTE — Progress Notes (Signed)
Marland Kitchen   [facility]  [address] [place] [address]  Dept: [phone]  Dept Fax: [phone]    Visit type: New patient    Reason for Visit: New Patient (Established Care.)         Assessment and Plan       1. Gastroesophageal reflux disease without esophagitis  Assessment & Plan:  Refill on famotidine 20 mg tablet BID as needed for acid reflux.   2. Postmenopausal atrophic vaginitis  Assessment & Plan:  Patient uses HRT and is doing well. Patient is to continue the Estring 2 mg vaginal ring every 90 days she can replace. Premarin 0.625mg  vaginal cream as needed.   3. Screening mammogram for breast cancer  -     MAM DIGITAL SCREEN W OR WO CAD BILATERAL; Future  4. Colon cancer screening  -     FECAL DNA COLORECTAL CANCER SCREENING (COLOGUARD)  5. Lipid screening  -     Lipid Panel; Future      Return in about 10 months (around 10/07/2021) for Physical Exam or as needed. .       Subjective       Ms. Garretson is a 65 y/o Caucasian female who presents today to establish care. She has no major concerns today. She is up to date with pap testing. She is due for some routine health maintenance- mammogram and colonoscopy.    Her previous OB/GYN has retired. She is using HRT and has been for a number of years. She has a years supply of refills on her E-string. She wants to make sure she can continue this medication with our office. She will use premarin cream on occasion as needed for vaginal dryness.     Patient also has a history of GERD relieved with famotidine. She needs a refill today.        Review of Systems   All other systems reviewed and are negative.       No Known Allergies    Outpatient Medications Prior to Visit   Medication Sig Dispense Refill   ??? ESTRING 2 MG vaginal ring 2 mg     ??? PREMARIN 0.625 MG/GM vaginal cream USE 1 GRAM VAGINALLY ONCE EACH WEEK.  3   ??? famotidine (PEPCID) 20 MG tablet Take 20 mg by mouth 2 times daily     ??? predniSONE (DELTASONE) 10 MG tablet  TAKE 2 TABLET BY MOUTH TWICE A DAY FOR 5 DAYS (Patient not taking: Reported on 12/08/2020)  0   ??? fluticasone (FLONASE) 50 MCG/ACT nasal spray 1 spray by Nasal route daily 1 Bottle 0   ??? VENTOLIN HFA 108 (90 Base) MCG/ACT inhaler INHALE 1 TO 2 PUFFS BY MOUTH EVERY 4 TO 6 HOURS AS NEEDED  1   ??? fluticasone (FLONASE) 50 MCG/ACT nasal spray 1 spray by Nasal route daily 1 Bottle 0     No facility-administered medications prior to visit.        History reviewed. No pertinent past medical history.     Social History     Tobacco Use   ??? Smoking status: Never Smoker   ??? Smokeless tobacco: Never Used   Substance Use Topics   ??? Alcohol use: Not on file        History reviewed. No pertinent surgical history.    Family History   Problem Relation Age of Onset   ??? Osteoporosis Mother    ??? Heart Disease  Father        Objective       BP 124/82 (Site: Left Upper Arm, Position: Sitting)    Pulse 103    Temp 97.1 ??F (36.2 ??C)    Ht 5\' 3"  (1.6 m)    Wt 138 lb 11.2 oz (62.9 kg)    SpO2 99%    BMI 24.57 kg/m??   Physical Exam  Vitals reviewed.   Constitutional:       General: She is not in acute distress.     Appearance: Normal appearance.   Cardiovascular:      Rate and Rhythm: Normal rate and regular rhythm.      Pulses: Normal pulses.      Heart sounds: Normal heart sounds. No murmur heard.  No friction rub. No gallop.    Pulmonary:      Effort: Pulmonary effort is normal.      Breath sounds: Normal breath sounds. No wheezing, rhonchi or rales.   Musculoskeletal:      Cervical back: Neck supple. No tenderness.   Neurological:      Mental Status: She is alert.           Data Reviewed and Summarized       Labs:  No labs available to review; Reviewed previous OB/GYN OV note.         , MD

## 2020-12-08 NOTE — Patient Instructions (Signed)
Patient Education        Mammogram: About This Test  What is it?     A mammogram is an X-ray of the breast that is used to screen for breast cancer. This test can find tumors that are too small for you or your doctor to feel. Cancer is most easily treated when it is found at an early stage.  Why is this test done?  A mammogram is done to:  ?? Look for breast cancer when there are no symptoms.  ?? Find breast cancer when there are symptoms. Symptoms of breast cancer may include a lump or thickening in the breast, nipple discharge, or dimpling of the skin on one area of the breast.  ?? Find an area of suspicious breast tissue to remove for an exam under a microscope (biopsy).  How do you prepare for the test?  If you've had a mammogram before at another clinic, have the results sent or bring them with you to your appointment.  On the day of the mammogram, don't use any deodorant. And don't use perfume, powders, or ointments near or on your breasts. The residue left on your skin by these substances may interfere with the X-rays.  How is the test done?  ?? You will need to take off any jewelry that might interfere with the X-ray pictures.  ?? You will need to take off your clothes above the waist.  ?? You will be given a cloth or paper gown to use during the test.  ?? You probably will stand during the mammogram.  ?? One at a time, your breasts will be placed on a flat plate.  ?? Another plate is then pressed firmly against your breast to help flatten out the breast tissue. You may be asked to lift your arm.  ?? For a few seconds while the X-ray picture is being taken, you will need to hold your breath.  ?? At least two pictures are taken of each breast. One is taken from the top and one from the side.  How does having a mammogram feel?  A mammogram is often uncomfortable but rarely painful. If you have sensitive or fragile skin or a skin condition, let the technician know before you have your exam. If you have menstrual  periods, the procedure is more comfortable when done within 2 weeks after your period has ended.  Having your breasts flattened is usually uncomfortable, but it helps the technician get the best images.  How long does the test take?  ?? The test will take about 10 to 15 minutes. You may be in the clinic for up to an hour.  ?? You may be asked to wait a few minutes while the images are checked to make sure they don't need to be redone.  What happens after the test?  ?? You will probably be able to go home right away.  ?? You can go back to your usual activities right away.  Follow-up care is a key part of your treatment and safety. Be sure to make and go to all appointments, and call your doctor if you are having problems. It's also a good idea to keep a list of the medicines you take. Ask your doctor when you can expect to have your test results.  Where can you learn more?  Go to [email or web address] and sign in to your MyChart account. Enter Z238 in the Search Health Information box to learn more about "Mammogram: About This Test."       If you do not have an account, please click on the "Sign Up Now" link.  Current as of: July 08, 2020??????????????????????????????Content Version: 13.1  ?? 2006-2021 [employer].   Care instructions adapted under license by [facility]. If you have questions about a medical condition or this instruction, always ask your healthcare professional. [employer], Inc[employer] disclaims any warranty or liability for your use of this information.

## 2020-12-10 NOTE — Telephone Encounter (Signed)
I am sorry. Chart says that she has seen her labs so I assumed she knew her HDL was 103. HDL is the good cholesterol.

## 2020-12-10 NOTE — Telephone Encounter (Signed)
From: Charlie Pitter  To: Dr. Jennette Kettle  Sent: 12/09/2020 7:54 AM EST  Subject: Question regarding LIPID PANEL    Thank you for this information. What is my HDL?

## 2020-12-24 ENCOUNTER — Other Ambulatory Visit (INDEPENDENT_AMBULATORY_CARE_PROVIDER_SITE_OTHER): Payer: Self-pay | Admitting: Physician Assistant

## 2020-12-24 ENCOUNTER — Encounter (INDEPENDENT_AMBULATORY_CARE_PROVIDER_SITE_OTHER): Payer: Self-pay | Admitting: Physician Assistant

## 2020-12-24 ENCOUNTER — Ambulatory Visit (INDEPENDENT_AMBULATORY_CARE_PROVIDER_SITE_OTHER): Payer: 59 | Admitting: Physician Assistant

## 2020-12-24 ENCOUNTER — Other Ambulatory Visit: Payer: Self-pay

## 2020-12-24 VITALS — BP 118/75 | HR 74 | Temp 97.6°F | Ht 63.0 in | Wt 186.0 lb

## 2020-12-24 DIAGNOSIS — E559 Vitamin D deficiency, unspecified: Secondary | ICD-10-CM

## 2020-12-24 DIAGNOSIS — E66811 Obesity, class 1: Secondary | ICD-10-CM

## 2020-12-24 DIAGNOSIS — E7849 Other hyperlipidemia: Secondary | ICD-10-CM

## 2020-12-24 DIAGNOSIS — R7303 Prediabetes: Secondary | ICD-10-CM

## 2020-12-24 DIAGNOSIS — Z6833 Body mass index (BMI) 33.0-33.9, adult: Secondary | ICD-10-CM | POA: Diagnosis not present

## 2020-12-24 DIAGNOSIS — Z9189 Other specified personal risk factors, not elsewhere classified: Secondary | ICD-10-CM

## 2020-12-24 DIAGNOSIS — E669 Obesity, unspecified: Secondary | ICD-10-CM

## 2020-12-24 DIAGNOSIS — H40023 Open angle with borderline findings, high risk, bilateral: Secondary | ICD-10-CM | POA: Diagnosis not present

## 2020-12-24 MED ORDER — WEGOVY 1.7 MG/0.75ML ~~LOC~~ SOAJ: 1.7000 mg | SUBCUTANEOUS | 0 refills | Status: DC

## 2020-12-24 MED FILL — WEGOVY 1.7 MG/0.75ML SOAJ: 1.7 | 28 days supply | Qty: 3 | Fill #0

## 2020-12-25 LAB — VITAMIN D 25 HYDROXY (VIT D DEFICIENCY, FRACTURES): Vit D, 25-Hydroxy: 66.2 ng/mL (ref 30.0–100.0)

## 2020-12-25 LAB — LIPID PANEL
Chol/HDL Ratio: 3.3 ratio (ref 0.0–4.4)
Cholesterol, Total: 127 mg/dL (ref 100–199)
HDL: 39 mg/dL — ABNORMAL LOW (ref >39)
LDL Chol Calc (NIH): 66 mg/dL (ref 0–99)
Triglycerides: 122 mg/dL (ref 0–149)
VLDL Cholesterol Cal: 22 mg/dL (ref 5–40)

## 2020-12-25 LAB — COMPREHENSIVE METABOLIC PANEL
ALT: 13 IU/L (ref 0–32)
AST: 18 IU/L (ref 0–40)
Albumin/Globulin Ratio: 1.8 (ref 1.2–2.2)
Albumin: 4.6 g/dL (ref 3.8–4.8)
Alkaline Phosphatase: 72 IU/L (ref 44–121)
BUN/Creatinine Ratio: 25 (ref 12–28)
BUN: 20 mg/dL (ref 8–27)
Bilirubin Total: 0.5 mg/dL (ref 0.0–1.2)
CO2: 24 mmol/L (ref 20–29)
Calcium: 9.6 mg/dL (ref 8.7–10.3)
Chloride: 103 mmol/L (ref 96–106)
Creatinine, Ser: 0.8 mg/dL (ref 0.57–1.00)
GFR calc Af Amer: 91 mL/min/{1.73_m2} (ref >59)
GFR calc non Af Amer: 79 mL/min/{1.73_m2} (ref >59)
Globulin, Total: 2.5 g/dL (ref 1.5–4.5)
Glucose: 88 mg/dL (ref 65–99)
Potassium: 3.8 mmol/L (ref 3.5–5.2)
Sodium: 143 mmol/L (ref 134–144)
Total Protein: 7.1 g/dL (ref 6.0–8.5)

## 2020-12-25 LAB — INSULIN, RANDOM: INSULIN: 15.8 u[IU]/mL (ref 2.6–24.9)

## 2020-12-25 LAB — HEMOGLOBIN A1C
Est. average glucose Bld gHb Est-mCnc: 111 mg/dL
Hgb A1c MFr Bld: 5.5 % (ref 4.8–5.6)

## 2020-12-28 NOTE — Progress Notes (Signed)
Chief Complaint:   OBESITY Morgan Williams is here to discuss her progress with her obesity treatment plan along with follow-up of her obesity related diagnoses. Morgan Williams is on the Category 2 Plan and states she is following her eating plan approximately 50% of the time. Morgan Williams states she is walking for 60+ minutes 2-3 times per week.  Today's visit was #: 52 Starting weight: 219 lbs Starting date: 09/20/2018 Today's weight: 186 lbs Today's date: 12/24/2020 Total lbs lost to date: 33 Total lbs lost since last in-office visit: 0  Interim History: Morgan Williams states that the [facility] is no longer helping decrease her hunger. She continues to report cravings, especially after indulging in cake for her birthday.  Subjective:   1. Other hyperlipidemia Morgan Williams is on Zocor, and she is due for labs. She denies chest pain or myalgias.  2. Pre-diabetes Morgan Williams's last A1c was 5.8. She reports polyphagia and cravings. She is exercising at [employer].  3. Vitamin D deficiency Morgan Williams is on vit D, and she denies nausea, vomiting, or muscle weakness.  4. At risk for diabetes mellitus Morgan Williams is at higher than average risk for developing diabetes due to obesity.   Assessment/Plan:   1. Other hyperlipidemia Cardiovascular risk and specific lipid/LDL goals reviewed. We discussed several lifestyle modifications today. Nikeya will continue her medications, and will continue to work on diet, exercise and weight loss efforts. We will check labs today. Orders and follow up as documented in patient record.   Counseling Intensive lifestyle modifications are the first line treatment for this issue. . Dietary changes: Increase soluble fiber. Decrease simple carbohydrates. . Exercise changes: Moderate to vigorous-intensity aerobic activity 150 minutes per week if tolerated. . Lipid-lowering medications: see documented in medical record.  - Lipid panel  2. Pre-diabetes Prudy will continue to work on weight loss, exercise,  and decreasing simple carbohydrates to help decrease the risk of diabetes. We will check labs today.  - Comprehensive metabolic panel - Hemoglobin A1c - Insulin, random  3. Vitamin D deficiency Low Vitamin D level contributes to fatigue and are associated with obesity, breast, and colon cancer. We will check labs today. Annelyse agreed to continue taking prescription Vitamin D 50,000 IU every week and will follow-up for routine testing of Vitamin D, at least 2-3 times per year to avoid over-replacement.  - VITAMIN D 25 Hydroxy (Vit-D Deficiency, Fractures)  4. At risk for diabetes mellitus Morgan Williams was given approximately 15 minutes of diabetes education and counseling today. We discussed intensive lifestyle modifications today with an emphasis on weight loss as well as increasing exercise and decreasing simple carbohydrates in her diet. We also reviewed medication options with an emphasis on risk versus benefit of those discussed.   Repetitive spaced learning was employed today to elicit superior memory formation and behavioral change.  5. Class 1 obesity with serious comorbidity and body mass index (BMI) of 33.0 to 33.9 in adult, unspecified obesity type Morgan Williams is currently in the action stage of change. As such, her goal is to continue with weight loss efforts. She has agreed to the Category 2 Plan.   We discussed various medication options to help Morgan Williams with her weight loss efforts and we both agreed to increase Wegovy to 1.7 mg #4 with no refills.  - Semaglutide-Weight Management (WEGOVY) 1.7 MG/0.75ML SOAJ; Inject 1.7 mg into the skin once a week.  Dispense: 3 mL; Refill: 0  Exercise goals: As is.  Behavioral modification strategies: keeping healthy foods in the home and  better snacking choices.  Morgan Williams has agreed to follow-up with our clinic in 2 to 3 weeks. She was informed of the importance of frequent follow-up visits to maximize her success with intensive lifestyle modifications for her  multiple health conditions.   Morgan Williams was informed we would discuss her lab results at her next visit unless there is a critical issue that needs to be addressed sooner. Morgan Williams agreed to keep her next visit at the agreed upon time to discuss these results.  Objective:   Blood pressure 118/75, pulse 74, temperature 97.6 F (36.4 C), height 5\' 3"  (1.6 m), weight 186 lb (84.4 kg), last menstrual period 11/01/2003, SpO2 97 %. Body mass index is 32.95 kg/m.  General: Cooperative, alert, well developed, in no acute distress. HEENT: Conjunctivae and lids unremarkable. Cardiovascular: Regular rhythm.  Lungs: Normal work of breathing. Neurologic: No focal deficits.   Lab Results  Component Value Date   CREATININE 0.80 12/24/2020   BUN 20 12/24/2020   NA 143 12/24/2020   K 3.8 12/24/2020   CL 103 12/24/2020   CO2 24 12/24/2020   Lab Results  Component Value Date   ALT 13 12/24/2020   AST 18 12/24/2020   ALKPHOS 72 12/24/2020   BILITOT 0.5 12/24/2020   Lab Results  Component Value Date   HGBA1C 5.5 12/24/2020   HGBA1C 5.8 (H) 07/28/2020   HGBA1C 5.7 (H) 02/28/2020   HGBA1C 5.6 09/05/2019   HGBA1C 5.5 05/02/2019   Lab Results  Component Value Date   INSULIN 15.8 12/24/2020   INSULIN 12.7 07/28/2020   INSULIN 8.3 09/05/2019   INSULIN 11.3 05/02/2019   INSULIN 17.2 09/20/2018   Lab Results  Component Value Date   TSH 1.680 07/28/2020   Lab Results  Component Value Date   CHOL 127 12/24/2020   HDL 39 (L) 12/24/2020   LDLCALC 66 12/24/2020   TRIG 122 12/24/2020   CHOLHDL 3.3 12/24/2020   Lab Results  Component Value Date   WBC 7.3 02/28/2020   HGB 12.2 02/28/2020   HCT 37.8 02/28/2020   MCV 95.0 02/28/2020   PLT 189 02/28/2020   No results found for: IRON, TIBC, FERRITIN  Attestation Statements:   Reviewed by clinician on day of visit: allergies, medications, problem list, medical history, surgical history, family history, social history, and previous encounter  notes.   Wilhemena Durie, am acting as transcriptionist for [employer], PA-C.  I have reviewed the above documentation for accuracy and completeness, and I agree with the above. Abby Potash, PA-C

## 2020-12-29 DIAGNOSIS — L814 Other melanin hyperpigmentation: Secondary | ICD-10-CM | POA: Diagnosis not present

## 2020-12-29 DIAGNOSIS — L570 Actinic keratosis: Secondary | ICD-10-CM | POA: Diagnosis not present

## 2020-12-29 DIAGNOSIS — D2262 Melanocytic nevi of left upper limb, including shoulder: Secondary | ICD-10-CM | POA: Diagnosis not present

## 2020-12-29 DIAGNOSIS — L821 Other seborrheic keratosis: Secondary | ICD-10-CM | POA: Diagnosis not present

## 2020-12-29 DIAGNOSIS — D225 Melanocytic nevi of trunk: Secondary | ICD-10-CM | POA: Diagnosis not present

## 2021-01-11 ENCOUNTER — Other Ambulatory Visit (HOSPITAL_COMMUNITY): Payer: Self-pay | Admitting: Family Medicine

## 2021-01-11 DIAGNOSIS — E7800 Pure hypercholesterolemia, unspecified: Secondary | ICD-10-CM | POA: Diagnosis not present

## 2021-01-11 DIAGNOSIS — E039 Hypothyroidism, unspecified: Secondary | ICD-10-CM | POA: Diagnosis not present

## 2021-01-11 DIAGNOSIS — I10 Essential (primary) hypertension: Secondary | ICD-10-CM | POA: Diagnosis not present

## 2021-01-11 DIAGNOSIS — Z0000 Encounter for general adult medical examination without abnormal findings: Secondary | ICD-10-CM | POA: Diagnosis not present

## 2021-01-11 DIAGNOSIS — Z6832 Body mass index (BMI) 32.0-32.9, adult: Secondary | ICD-10-CM | POA: Diagnosis not present

## 2021-01-11 DIAGNOSIS — G4700 Insomnia, unspecified: Secondary | ICD-10-CM | POA: Diagnosis not present

## 2021-01-11 DIAGNOSIS — K219 Gastro-esophageal reflux disease without esophagitis: Secondary | ICD-10-CM | POA: Diagnosis not present

## 2021-01-11 DIAGNOSIS — E669 Obesity, unspecified: Secondary | ICD-10-CM | POA: Diagnosis not present

## 2021-01-11 DIAGNOSIS — M8588 Other specified disorders of bone density and structure, other site: Secondary | ICD-10-CM | POA: Diagnosis not present

## 2021-01-12 ENCOUNTER — Ambulatory Visit (INDEPENDENT_AMBULATORY_CARE_PROVIDER_SITE_OTHER): Payer: 59 | Admitting: Physician Assistant

## 2021-01-12 ENCOUNTER — Ambulatory Visit
Admission: RE | Admit: 2021-01-12 | Discharge: 2021-01-12 | Disposition: A | Discharge status: Home / Self Care | Payer: 59 | Source: Ambulatory Visit | Attending: Obstetrics & Gynecology | Admitting: Obstetrics & Gynecology

## 2021-01-12 ENCOUNTER — Other Ambulatory Visit: Payer: Self-pay

## 2021-01-12 ENCOUNTER — Other Ambulatory Visit (INDEPENDENT_AMBULATORY_CARE_PROVIDER_SITE_OTHER): Payer: Self-pay | Admitting: Physician Assistant

## 2021-01-12 VITALS — BP 103/67 | HR 77 | Temp 97.7°F | Ht 63.0 in | Wt 183.0 lb

## 2021-01-12 DIAGNOSIS — E038 Other specified hypothyroidism: Secondary | ICD-10-CM | POA: Diagnosis not present

## 2021-01-12 DIAGNOSIS — Z6833 Body mass index (BMI) 33.0-33.9, adult: Secondary | ICD-10-CM | POA: Diagnosis not present

## 2021-01-12 DIAGNOSIS — Z1231 Encounter for screening mammogram for malignant neoplasm of breast: Secondary | ICD-10-CM | POA: Diagnosis not present

## 2021-01-12 DIAGNOSIS — Z9189 Other specified personal risk factors, not elsewhere classified: Secondary | ICD-10-CM

## 2021-01-12 DIAGNOSIS — I10 Essential (primary) hypertension: Secondary | ICD-10-CM

## 2021-01-12 DIAGNOSIS — E669 Obesity, unspecified: Secondary | ICD-10-CM

## 2021-01-12 MED ORDER — LEVOTHYROXINE SODIUM 100 MCG PO TABS: 100.0000 ug | ORAL_TABLET | Freq: Every day | ORAL | 0 refills | Status: DC

## 2021-01-12 MED ORDER — LEVOTHYROXINE SODIUM 88 MCG PO TABS: 88.0000 ug | ORAL_TABLET | Freq: Every day | ORAL | 0 refills | Status: DC

## 2021-01-12 MED ORDER — WEGOVY 1.7 MG/0.75ML ~~LOC~~ SOAJ: 1.7000 mg | SUBCUTANEOUS | 0 refills | Status: DC

## 2021-01-15 ENCOUNTER — Encounter: Attending: Family Medicine | Primary: Family Medicine

## 2021-01-18 NOTE — Progress Notes (Signed)
Chief Complaint:   OBESITY Morgan Williams is here to discuss her progress with her obesity treatment plan along with follow-up of her obesity related diagnoses. Morgan Williams is on the Category 2 Plan and states she is following her eating plan approximately 70% of the time. Morgan Williams states she is bike riding and walking for 40-45 minutes 3-4 times per week.  Today's visit was #: 67 Starting weight: 219 lbs Starting date: 09/20/2018 Today's weight: 183 lbs Today's date: 01/12/2021 Total lbs lost to date: 37 Total lbs lost since last in-office visit: 5  Interim History: Morgan Williams states the increase to [facility] 11.7 mg helped decrease her appetite. Morgan Williams is no longer going to be covered by her insurance. Her plan is going well overall and she is portion controlling well.  Subjective:   1. Other specified hypothyroidism Anyla denies excessive fatigue or hair loss. She is on levothyroxine.  2. Essential hypertension with hyperlipidemia Morgan Williams's blood pressure is controlled. She denies chest pain or headache. She is on Microzide and Cozaar.  3. At risk for heart disease Morgan Williams is at a higher than average risk for cardiovascular disease due to obesity.   Assessment/Plan:   1. Other specified hypothyroidism We will refill Levothyroxine 100 mcg for 90 days with no refills, and we will refill levothyroxine 88 mcg for 90 days with no refills. Orders and follow up as documented in patient record.  Counseling . Good thyroid control is important for overall health. Supratherapeutic thyroid levels are dangerous and will not improve weight loss results. . Counseling: The correct way to take levothyroxine is fasting, with water, separated by at least 30 minutes from breakfast, and separated by more than 4 hours from calcium, iron, multivitamins, acid reflux medications (PPIs).   - levothyroxine (SYNTHROID) 100 MCG tablet; Take 1 tablet (100 mcg total) by mouth daily.  Dispense: 90 tablet; Refill: 0 - levothyroxine  (SYNTHROID) 88 MCG tablet; Take 1 tablet (88 mcg total) by mouth daily before breakfast.  Dispense: 90 tablet; Refill: 0  2. Essential hypertension with hyperlipidemia [name] will continue her medications, and will continue working on healthy weight loss and increasing exercise to improve blood pressure control. We will watch for signs of hypotension as she continues her lifestyle modifications.  3. At risk for heart disease Morgan Williams was given approximately 15 minutes of coronary artery disease prevention counseling today. She is 65 y.o. female and has risk factors for heart disease including obesity. We discussed intensive lifestyle modifications today with an emphasis on specific weight loss instructions and strategies.   Repetitive spaced learning was employed today to elicit superior memory formation and behavioral change.  4. Class 1 obesity with serious comorbidity and body mass index (BMI) of 33.0 to 33.9 in adult, unspecified obesity type Morgan Williams is currently in the action stage of change. As such, her goal is to continue with weight loss efforts. She has agreed to the Category 2 Plan.   We discussed various medication options to help Morgan Williams with her weight loss efforts and we both agreed to continue [facility], and we will refill for 1 month.  - Semaglutide-Weight Management (WEGOVY) 1.7 MG/0.75ML SOAJ; Inject 1.7 mg into the skin once a week.  Dispense: 9 mL; Refill: 0  Exercise goals: As is.  Behavioral modification strategies: meal planning and cooking strategies and keeping healthy foods in the home.  Morgan Williams has agreed to follow-up with our clinic in 2 weeks. She was informed of the importance of frequent follow-up visits to maximize her  success with intensive lifestyle modifications for her multiple health conditions.   Objective:   Blood pressure 103/67, pulse 77, temperature 97.7 F (36.5 C), height 5\' 3"  (1.6 m), weight 183 lb (83 kg), last menstrual period 11/01/2003, SpO2 99 %. Body  mass index is 32.42 kg/m.  General: Cooperative, alert, well developed, in no acute distress. HEENT: Conjunctivae and lids unremarkable. Cardiovascular: Regular rhythm.  Lungs: Normal work of breathing. Neurologic: No focal deficits.   Lab Results  Component Value Date   CREATININE 0.80 12/24/2020   BUN 20 12/24/2020   NA 143 12/24/2020   K 3.8 12/24/2020   CL 103 12/24/2020   CO2 24 12/24/2020   Lab Results  Component Value Date   ALT 13 12/24/2020   AST 18 12/24/2020   ALKPHOS 72 12/24/2020   BILITOT 0.5 12/24/2020   Lab Results  Component Value Date   HGBA1C 5.5 12/24/2020   HGBA1C 5.8 (H) 07/28/2020   HGBA1C 5.7 (H) 02/28/2020   HGBA1C 5.6 09/05/2019   HGBA1C 5.5 05/02/2019   Lab Results  Component Value Date   INSULIN 15.8 12/24/2020   INSULIN 12.7 07/28/2020   INSULIN 8.3 09/05/2019   INSULIN 11.3 05/02/2019   INSULIN 17.2 09/20/2018   Lab Results  Component Value Date   TSH 1.680 07/28/2020   Lab Results  Component Value Date   CHOL 127 12/24/2020   HDL 39 (L) 12/24/2020   LDLCALC 66 12/24/2020   TRIG 122 12/24/2020   CHOLHDL 3.3 12/24/2020   Lab Results  Component Value Date   WBC 7.3 02/28/2020   HGB 12.2 02/28/2020   HCT 37.8 02/28/2020   MCV 95.0 02/28/2020   PLT 189 02/28/2020   No results found for: IRON, TIBC, FERRITIN  Attestation Statements:   Reviewed by clinician on day of visit: allergies, medications, problem list, medical history, surgical history, family history, social history, and previous encounter notes.   Wilhemena Durie, am acting as transcriptionist for [employer], PA-C.  I have reviewed the above documentation for accuracy and completeness, and I agree with the above. Abby Potash, PA-C

## 2021-01-26 ENCOUNTER — Encounter (INDEPENDENT_AMBULATORY_CARE_PROVIDER_SITE_OTHER): Payer: Self-pay | Admitting: Physician Assistant

## 2021-01-26 ENCOUNTER — Ambulatory Visit (INDEPENDENT_AMBULATORY_CARE_PROVIDER_SITE_OTHER): Payer: 59 | Admitting: Physician Assistant

## 2021-01-26 ENCOUNTER — Other Ambulatory Visit: Payer: Self-pay

## 2021-01-26 VITALS — BP 108/72 | HR 79 | Temp 98.3°F | Ht 63.0 in | Wt 182.0 lb

## 2021-01-26 DIAGNOSIS — R7303 Prediabetes: Secondary | ICD-10-CM | POA: Diagnosis not present

## 2021-01-26 DIAGNOSIS — Z6835 Body mass index (BMI) 35.0-35.9, adult: Secondary | ICD-10-CM | POA: Diagnosis not present

## 2021-02-01 NOTE — Progress Notes (Signed)
Chief Complaint:   OBESITY Morgan Williams is here to discuss her progress with her obesity treatment plan along with follow-up of her obesity related diagnoses. Morgan Williams is on the Category 2 Plan and states she is following her eating plan approximately 70% of the time. Morgan Williams states she is walking for 40 minutes 7 times per week.  Today's visit was #: 11 Starting weight: 219 lbs Starting date: 09/20/2018 Today's weight: 182 lbs Today's date: 01/26/2021 Total lbs lost to date: 37 Total lbs lost since last in-office visit: 1  Interim History: Morgan Williams reports that she has not been on the meal plan as closely the past 2 weeks. She had a friend visiting and they drank wine. She notes Morgan Williams is helping her portion control and she is not as hungry.  Subjective:   1. Pre-diabetes Morgan Williams is not on medications, and her last A1c was 5.5. She denies polyphagia, and she is exercising regularly.  Assessment/Plan:   1. Pre-diabetes Morgan Williams will continue her meal plan, weight loss, exercise, and decreasing simple carbohydrates to help decrease the risk of diabetes.   2. Class 2 severe obesity with serious comorbidity and body mass index (BMI) of 35.0 to 35.9 in adult, unspecified obesity type [facility]) Morgan Williams is currently in the action stage of change. As such, her goal is to continue with weight loss efforts. She has agreed to the Category 2 Plan.   Exercise goals: As is.  Behavioral modification strategies: meal planning and cooking strategies and keeping healthy foods in the home.  Morgan Williams has agreed to follow-up with our clinic in 2 weeks. She was informed of the importance of frequent follow-up visits to maximize her success with intensive lifestyle modifications for her multiple health conditions.   Objective:   Blood pressure 108/72, pulse 79, temperature 98.3 F (36.8 C), height 5\' 3"  (1.6 m), weight 182 lb (82.6 kg), last menstrual period 11/01/2003, SpO2 98 %. Body mass index is 32.24  kg/m.  General: Cooperative, alert, well developed, in no acute distress. HEENT: Conjunctivae and lids unremarkable. Cardiovascular: Regular rhythm.  Lungs: Normal work of breathing. Neurologic: No focal deficits.   Lab Results  Component Value Date   CREATININE 0.80 12/24/2020   BUN 20 12/24/2020   NA 143 12/24/2020   K 3.8 12/24/2020   CL 103 12/24/2020   CO2 24 12/24/2020   Lab Results  Component Value Date   ALT 13 12/24/2020   AST 18 12/24/2020   ALKPHOS 72 12/24/2020   BILITOT 0.5 12/24/2020   Lab Results  Component Value Date   HGBA1C 5.5 12/24/2020   HGBA1C 5.8 (H) 07/28/2020   HGBA1C 5.7 (H) 02/28/2020   HGBA1C 5.6 09/05/2019   HGBA1C 5.5 05/02/2019   Lab Results  Component Value Date   INSULIN 15.8 12/24/2020   INSULIN 12.7 07/28/2020   INSULIN 8.3 09/05/2019   INSULIN 11.3 05/02/2019   INSULIN 17.2 09/20/2018   Lab Results  Component Value Date   TSH 1.680 07/28/2020   Lab Results  Component Value Date   CHOL 127 12/24/2020   HDL 39 (L) 12/24/2020   LDLCALC 66 12/24/2020   TRIG 122 12/24/2020   CHOLHDL 3.3 12/24/2020   Lab Results  Component Value Date   WBC 7.3 02/28/2020   HGB 12.2 02/28/2020   HCT 37.8 02/28/2020   MCV 95.0 02/28/2020   PLT 189 02/28/2020   No results found for: IRON, TIBC, FERRITIN  Attestation Statements:   Reviewed by clinician on day of visit:  allergies, medications, problem list, medical history, surgical history, family history, social history, and previous encounter notes.  Time spent on visit including pre-visit chart review and post-visit care and charting was 31 minutes.    Wilhemena Durie, am acting as transcriptionist for [employer], PA-C.  I have reviewed the above documentation for accuracy and completeness, and I agree with the above. Abby Potash, PA-C

## 2021-02-09 ENCOUNTER — Ambulatory Visit (INDEPENDENT_AMBULATORY_CARE_PROVIDER_SITE_OTHER): Payer: 59 | Admitting: Physician Assistant

## 2021-02-12 ENCOUNTER — Other Ambulatory Visit (HOSPITAL_COMMUNITY): Payer: Self-pay

## 2021-02-12 MED FILL — Semaglutide (Weight Mngmt) Soln Auto-Injector 1.7 MG/0.75ML: SUBCUTANEOUS | 28 days supply | Qty: 3 | Fill #0 | Status: AC

## 2021-02-24 ENCOUNTER — Other Ambulatory Visit (HOSPITAL_COMMUNITY): Payer: Self-pay

## 2021-03-18 ENCOUNTER — Other Ambulatory Visit: Payer: Self-pay

## 2021-03-18 ENCOUNTER — Encounter (INDEPENDENT_AMBULATORY_CARE_PROVIDER_SITE_OTHER): Payer: Self-pay | Admitting: Physician Assistant

## 2021-03-18 ENCOUNTER — Other Ambulatory Visit (HOSPITAL_COMMUNITY): Payer: Self-pay

## 2021-03-18 ENCOUNTER — Ambulatory Visit (INDEPENDENT_AMBULATORY_CARE_PROVIDER_SITE_OTHER): Payer: 59 | Admitting: Physician Assistant

## 2021-03-18 VITALS — BP 109/70 | HR 71 | Temp 98.4°F | Ht 63.0 in | Wt 181.0 lb

## 2021-03-18 DIAGNOSIS — Z9189 Other specified personal risk factors, not elsewhere classified: Secondary | ICD-10-CM | POA: Diagnosis not present

## 2021-03-18 DIAGNOSIS — E559 Vitamin D deficiency, unspecified: Secondary | ICD-10-CM | POA: Diagnosis not present

## 2021-03-18 DIAGNOSIS — Z6835 Body mass index (BMI) 35.0-35.9, adult: Secondary | ICD-10-CM | POA: Diagnosis not present

## 2021-03-18 MED ORDER — WEGOVY 2.4 MG/0.75ML ~~LOC~~ SOAJ
2.4000 mg | SUBCUTANEOUS | 0 refills | Status: DC
  Filled 2021-03-18: qty 3, 28d supply, fill #0
  Filled 2021-04-14: qty 6, 56d supply, fill #1

## 2021-03-18 NOTE — Progress Notes (Signed)
Chief Complaint:   OBESITY Morgan Williams is here to discuss her progress with her obesity treatment plan along with follow-up of her obesity related diagnoses. Morgan Williams is on the Category 2 Plan and states she is following her eating plan approximately 50% of the time. Morgan Williams states she is doing 0 minutes 0 times per week.  Today's visit was #: 61 Starting weight: 219 lbs Starting date: 09/20/2018 Today's weight: 181 lbs Today's date: 03/18/2021 Total lbs lost to date: 38 Total lbs lost since last in-office visit: 1  Interim History: Morgan Williams did well with weight loss. She reports that she needs the accountability of coming in for office visits every 2-3 weeks, otherwise she gets way off the plan.  Subjective:   1. Vitamin D deficiency Morgan Williams is on Vit D, and she denies nausea, vomiting, or muscle weakness.  2. At risk for osteoporosis Morgan Williams is at higher risk of osteopenia and osteoporosis due to Vitamin D deficiency.   Assessment/Plan:   1. Vitamin D deficiency Low Vitamin D level contributes to fatigue and are associated with obesity, breast, and colon cancer. Avalina agreed to continue taking prescription Vitamin D 50,000 IU every week and will follow-up for routine testing of Vitamin D, at least 2-3 times per year to avoid over-replacement.  2. At risk for osteoporosis Morgan Williams was given approximately 15 minutes of osteoporosis prevention counseling today. Morgan Williams is at risk for osteopenia and osteoporosis due to her Vitamin D deficiency. She was encouraged to take her Vitamin D and follow her higher calcium diet and increase strengthening exercise to help strengthen her bones and decrease her risk of osteopenia and osteoporosis.  Repetitive spaced learning was employed today to elicit superior memory formation and behavioral change.  3. Class 2 severe obesity with serious comorbidity and body mass index (BMI) of 35.0 to 35.9 in adult, unspecified obesity type [facility]) Morgan Williams is currently in the  action stage of change. As such, her goal is to continue with weight loss efforts. She has agreed to the Category 2 Plan.   We discussed various medication options to help Morgan Williams with her weight loss efforts and we both agreed to increase Wegovy to 2.4 mg with no refills.  - Semaglutide-Weight Management (WEGOVY) 2.4 MG/0.75ML SOAJ; Inject 2.4 mg into the skin once a week.  Dispense: 9 mL; Refill: 0  Exercise goals: No exercise has been prescribed at this time.  Behavioral modification strategies: meal planning and cooking strategies and planning for success.  Morgan Williams has agreed to follow-up with our clinic in 3 weeks. She was informed of the importance of frequent follow-up visits to maximize her success with intensive lifestyle modifications for her multiple health conditions.   Objective:   Blood pressure 109/70, pulse 71, temperature 98.4 F (36.9 C), temperature source Oral, height 5\' 3"  (1.6 m), weight 181 lb (82.1 kg), last menstrual period 11/01/2003, SpO2 96 %. Body mass index is 32.06 kg/m.  General: Cooperative, alert, well developed, in no acute distress. HEENT: Conjunctivae and lids unremarkable. Cardiovascular: Regular rhythm.  Lungs: Normal work of breathing. Neurologic: No focal deficits.   Lab Results  Component Value Date   CREATININE 0.80 12/24/2020   BUN 20 12/24/2020   NA 143 12/24/2020   K 3.8 12/24/2020   CL 103 12/24/2020   CO2 24 12/24/2020   Lab Results  Component Value Date   ALT 13 12/24/2020   AST 18 12/24/2020   ALKPHOS 72 12/24/2020   BILITOT 0.5 12/24/2020   Lab Results  Component Value Date   HGBA1C 5.5 12/24/2020   HGBA1C 5.8 (H) 07/28/2020   HGBA1C 5.7 (H) 02/28/2020   HGBA1C 5.6 09/05/2019   HGBA1C 5.5 05/02/2019   Lab Results  Component Value Date   INSULIN 15.8 12/24/2020   INSULIN 12.7 07/28/2020   INSULIN 8.3 09/05/2019   INSULIN 11.3 05/02/2019   INSULIN 17.2 09/20/2018   Lab Results  Component Value Date   TSH 1.680  07/28/2020   Lab Results  Component Value Date   CHOL 127 12/24/2020   HDL 39 (L) 12/24/2020   LDLCALC 66 12/24/2020   TRIG 122 12/24/2020   CHOLHDL 3.3 12/24/2020   Lab Results  Component Value Date   WBC 7.3 02/28/2020   HGB 12.2 02/28/2020   HCT 37.8 02/28/2020   MCV 95.0 02/28/2020   PLT 189 02/28/2020   No results found for: IRON, TIBC, FERRITIN  Attestation Statements:   Reviewed by clinician on day of visit: allergies, medications, problem list, medical history, surgical history, family history, social history, and previous encounter notes.   Wilhemena Durie, am acting as transcriptionist for [employer], PA-C.  I have reviewed the above documentation for accuracy and completeness, and I agree with the above. Abby Potash, PA-C

## 2021-04-05 ENCOUNTER — Encounter (INDEPENDENT_AMBULATORY_CARE_PROVIDER_SITE_OTHER): Payer: Self-pay | Admitting: Physician Assistant

## 2021-04-05 ENCOUNTER — Other Ambulatory Visit: Payer: Self-pay

## 2021-04-05 ENCOUNTER — Ambulatory Visit (INDEPENDENT_AMBULATORY_CARE_PROVIDER_SITE_OTHER): Payer: 59 | Admitting: Physician Assistant

## 2021-04-05 VITALS — BP 106/69 | HR 80 | Temp 98.8°F | Ht 63.0 in | Wt 179.0 lb

## 2021-04-05 DIAGNOSIS — I10 Essential (primary) hypertension: Secondary | ICD-10-CM

## 2021-04-05 DIAGNOSIS — Z6835 Body mass index (BMI) 35.0-35.9, adult: Secondary | ICD-10-CM

## 2021-04-07 ENCOUNTER — Other Ambulatory Visit (HOSPITAL_COMMUNITY): Payer: Self-pay

## 2021-04-07 ENCOUNTER — Other Ambulatory Visit: Payer: Self-pay

## 2021-04-07 MED ORDER — CARESTART COVID-19 HOME TEST VI KIT
PACK | 0 refills | Status: DC
  Filled 2021-04-07: qty 4, 4d supply, fill #0

## 2021-04-07 NOTE — Progress Notes (Signed)
Chief Complaint:   OBESITY Morgan Williams is here to discuss her progress with her obesity treatment plan along with follow-up of her obesity related diagnoses. Morgan Williams is on the Category 2 Plan and states she is following her eating plan approximately 0% of the time. Morgan Williams states she is doing 0 minutes 0 times per week.  Today's visit was #: 43 Starting weight: 219 lbs Starting date: 09/20/2018 Today's weight: 179 lbs Today's date: 04/05/2021 Total lbs lost to date: 40 Total lbs lost since last in-office visit: 2  Interim History: Morgan Williams's mom fell recently and broke her hip. She is currently in the emergency room, which has caused a great deal of stress. She is skipping meals and eating out often. She started 2.4 mg of Wegovy this past week and she is tolerating it well.  Subjective:   1. Essential hypertension with hyperlipidemia Morgan Williams is on losartan, and her blood pressure is well controlled.  Assessment/Plan:   1. Essential hypertension with hyperlipidemia Morgan Williams will continue with her medications, healthy weight loss, and exercise to improve blood pressure control. We will watch for signs of hypotension as she continues her lifestyle modifications.  2. Class 2 severe obesity with serious comorbidity and body mass index (BMI) of 35.0 to 35.9 in adult, unspecified obesity type [facility]) Morgan Williams is currently in the action stage of change. As such, her goal is to continue with weight loss efforts. She has agreed to the Category 2 Plan.   Exercise goals: No exercise has been prescribed at this time.  Behavioral modification strategies: meal planning and cooking strategies and keeping healthy foods in the home.  Morgan Williams has agreed to follow-up with our clinic in 3 weeks. She was informed of the importance of frequent follow-up visits to maximize her success with intensive lifestyle modifications for her multiple health conditions.   Objective:   Blood pressure 106/69, pulse 80, temperature 98.8  F (37.1 C), height 5\' 3"  (1.6 m), weight 179 lb (81.2 kg), last menstrual period 11/01/2003, SpO2 99 %. Body mass index is 31.71 kg/m.  General: Cooperative, alert, well developed, in no acute distress. HEENT: Conjunctivae and lids unremarkable. Cardiovascular: Regular rhythm.  Lungs: Normal work of breathing. Neurologic: No focal deficits.   Lab Results  Component Value Date   CREATININE 0.80 12/24/2020   BUN 20 12/24/2020   NA 143 12/24/2020   K 3.8 12/24/2020   CL 103 12/24/2020   CO2 24 12/24/2020   Lab Results  Component Value Date   ALT 13 12/24/2020   AST 18 12/24/2020   ALKPHOS 72 12/24/2020   BILITOT 0.5 12/24/2020   Lab Results  Component Value Date   HGBA1C 5.5 12/24/2020   HGBA1C 5.8 (H) 07/28/2020   HGBA1C 5.7 (H) 02/28/2020   HGBA1C 5.6 09/05/2019   HGBA1C 5.5 05/02/2019   Lab Results  Component Value Date   INSULIN 15.8 12/24/2020   INSULIN 12.7 07/28/2020   INSULIN 8.3 09/05/2019   INSULIN 11.3 05/02/2019   INSULIN 17.2 09/20/2018   Lab Results  Component Value Date   TSH 1.680 07/28/2020   Lab Results  Component Value Date   CHOL 127 12/24/2020   HDL 39 (L) 12/24/2020   LDLCALC 66 12/24/2020   TRIG 122 12/24/2020   CHOLHDL 3.3 12/24/2020   Lab Results  Component Value Date   WBC 7.3 02/28/2020   HGB 12.2 02/28/2020   HCT 37.8 02/28/2020   MCV 95.0 02/28/2020   PLT 189 02/28/2020   No results  found for: IRON, TIBC, FERRITIN  Attestation Statements:   Reviewed by clinician on day of visit: allergies, medications, problem list, medical history, surgical history, family history, social history, and previous encounter notes.  Time spent on visit including pre-visit chart review and post-visit care and charting was 30 minutes.    Wilhemena Durie, am acting as transcriptionist for [employer], PA-C.  I have reviewed the above documentation for accuracy and completeness, and I agree with the above. Abby Potash, PA-C

## 2021-04-13 MED ORDER — FAMOTIDINE 20 MG PO TABS
20 MG | ORAL_TABLET | ORAL | 3 refills | Status: DC
Start: 2021-04-13 — End: 2021-08-25

## 2021-04-13 NOTE — Telephone Encounter (Signed)
LOV 12/08/20

## 2021-04-14 ENCOUNTER — Other Ambulatory Visit (HOSPITAL_COMMUNITY): Payer: Self-pay

## 2021-04-14 MED FILL — Simvastatin Tab 40 MG: ORAL | 90 days supply | Qty: 90 | Fill #0 | Status: AC

## 2021-04-14 MED FILL — Losartan Potassium Tab 50 MG: ORAL | 90 days supply | Qty: 90 | Fill #0 | Status: AC

## 2021-04-14 MED FILL — Famotidine Tab 20 MG: ORAL | 90 days supply | Qty: 180 | Fill #0 | Status: AC

## 2021-04-14 MED FILL — Hydrochlorothiazide Cap 12.5 MG: ORAL | 90 days supply | Qty: 90 | Fill #0 | Status: AC

## 2021-04-18 IMAGING — MG MM DIGITAL SCREENING BILAT W/ TOMO AND CAD
8 series · 8 of 24 positions shown · non-contrast
Comparison: Previous exam(s).

CLINICAL DATA: Screening.

EXAM:
DIGITAL SCREENING BILATERAL MAMMOGRAM WITH TOMOSYNTHESIS AND CAD
TECHNIQUE: Bilateral screening digital craniocaudal and mediolateral oblique
mammograms were obtained. Bilateral screening digital breast
tomosynthesis was performed. The images were evaluated with
computer-aided detection.

[R CC synth-2D]
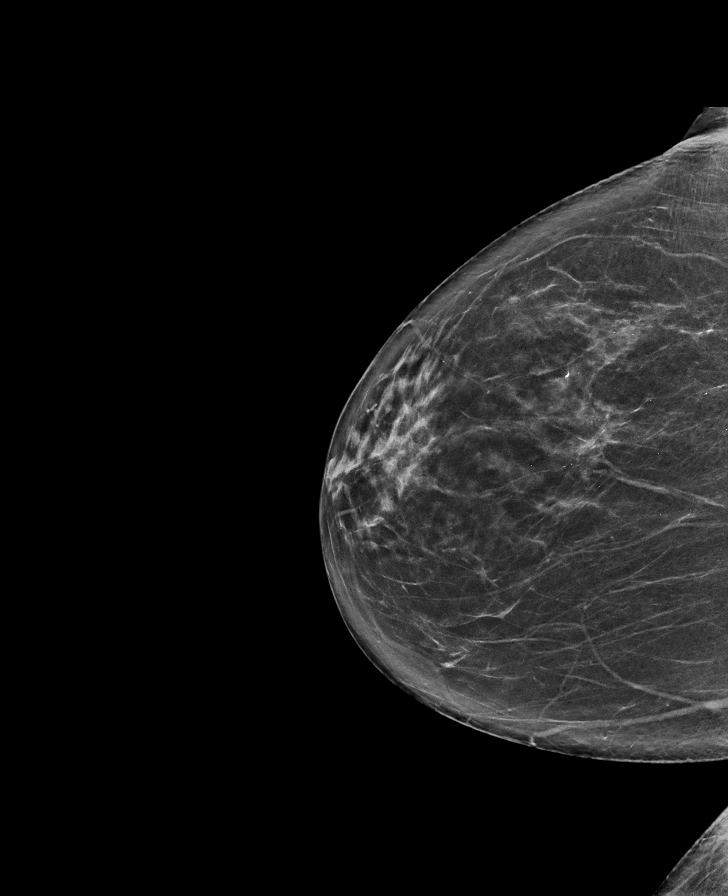

[R MLO synth-2D]
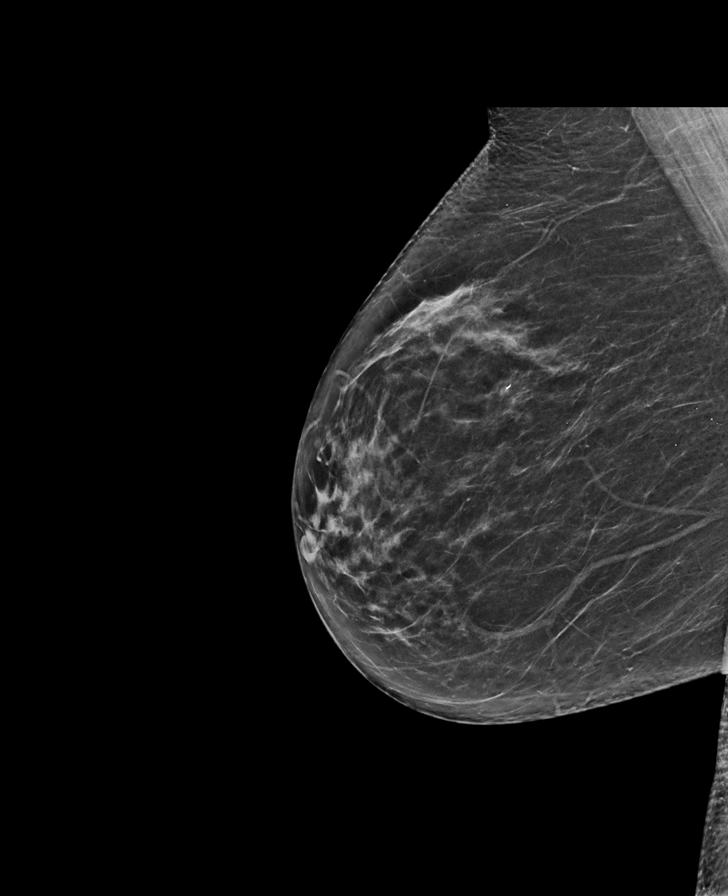

[L MLO synth-2D]
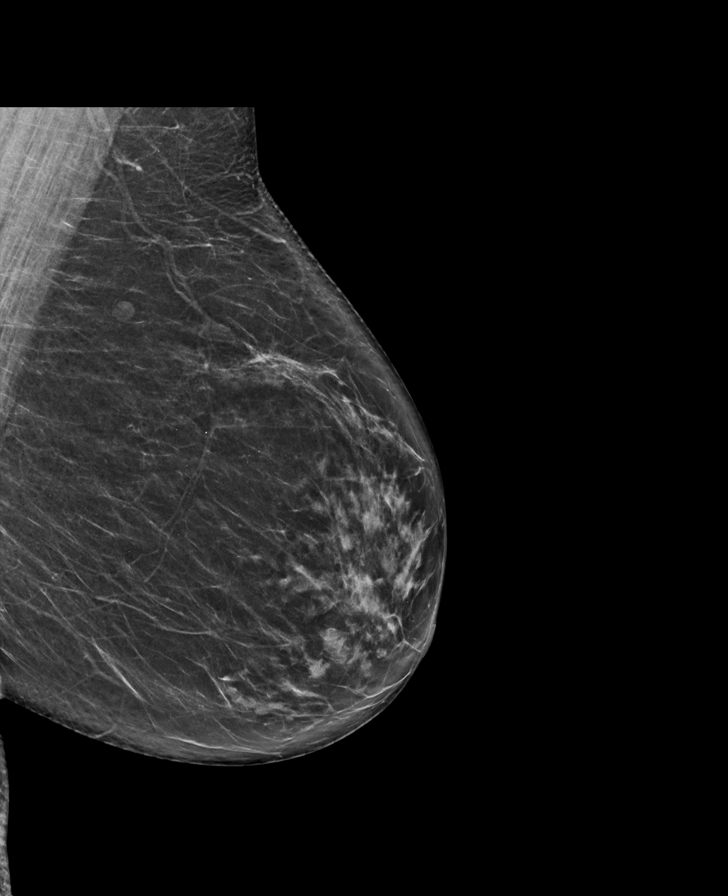

[L CC synth-2D]
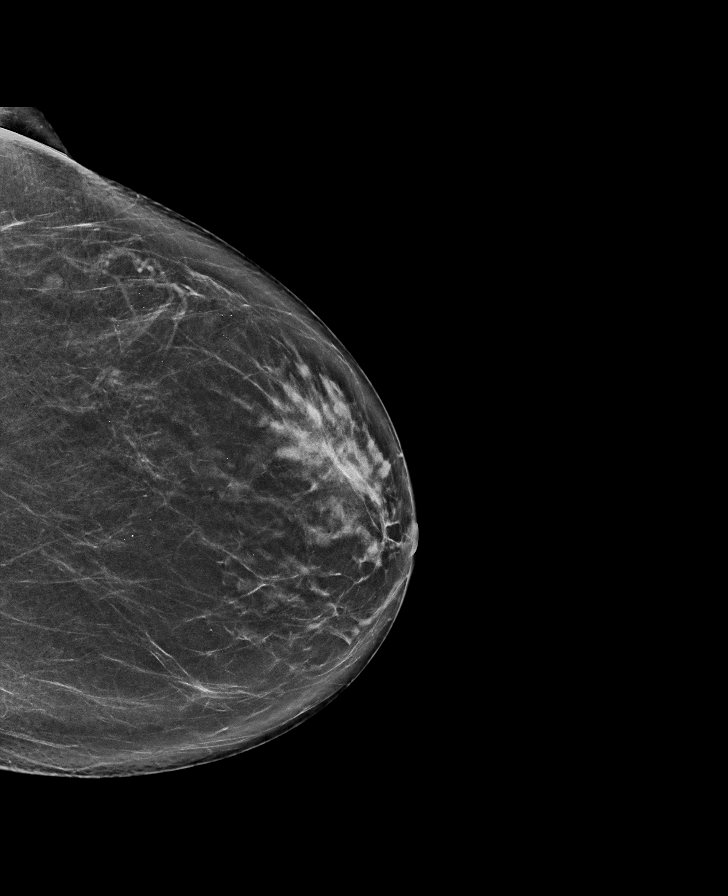

[R CC tomo · tomo slice 36/71.0]
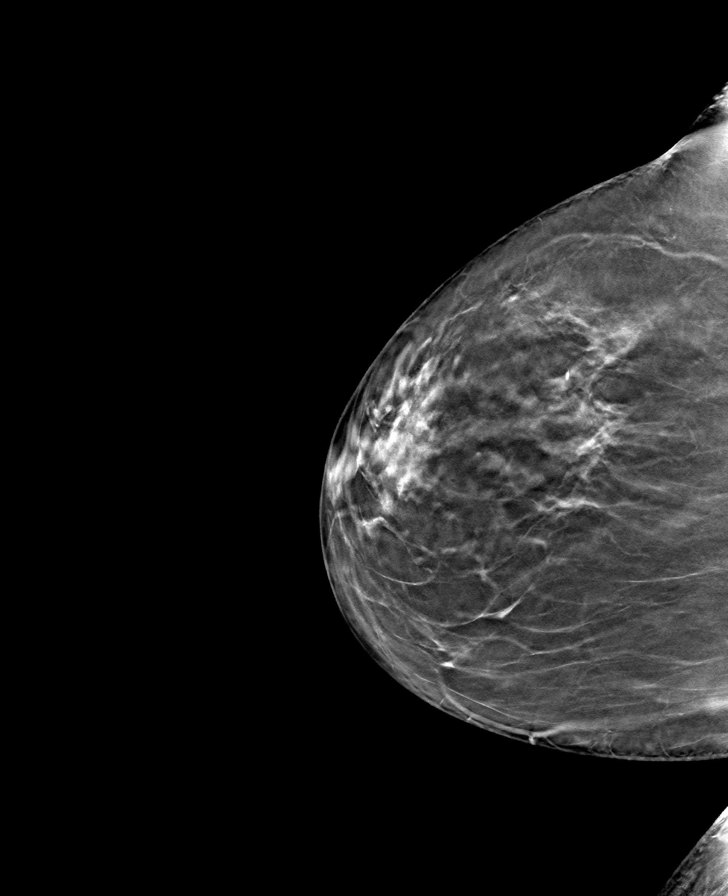

[L CC tomo · tomo slice 37/72.0]
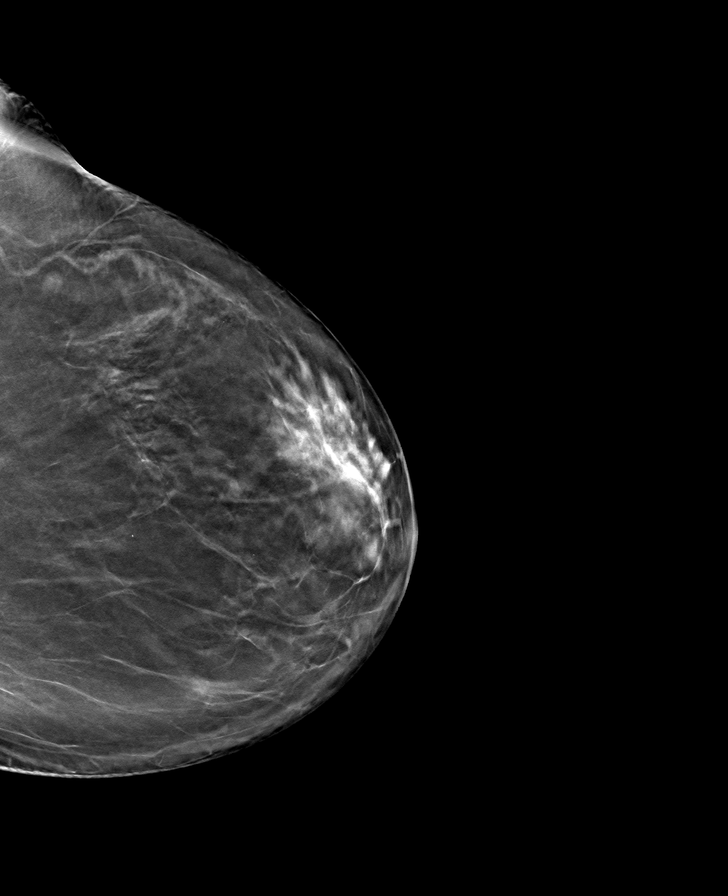

[R MLO tomo · tomo slice 36/71.0]
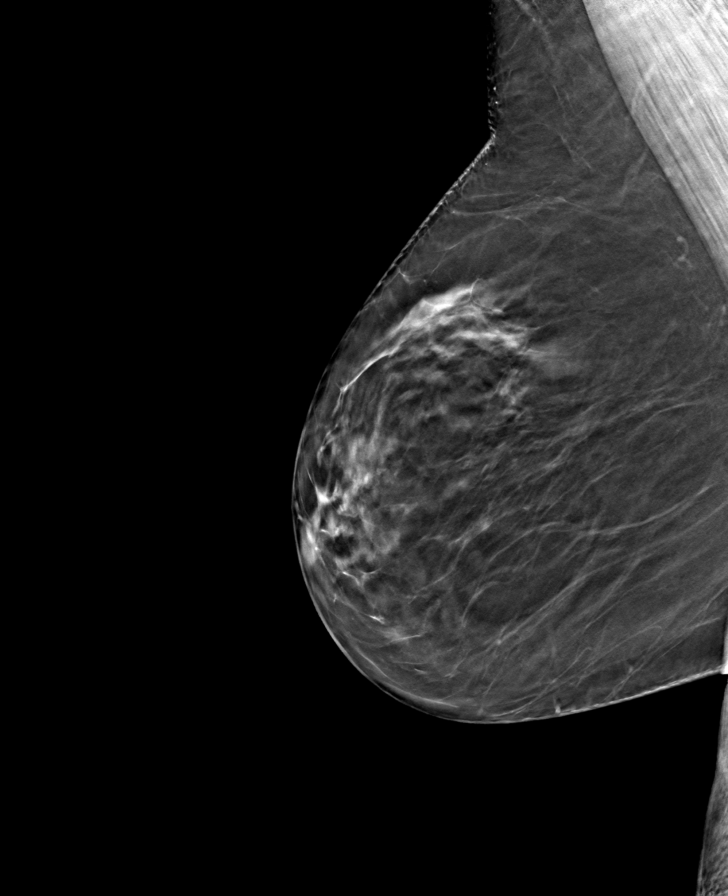

[L MLO tomo · tomo slice 37/74.0]
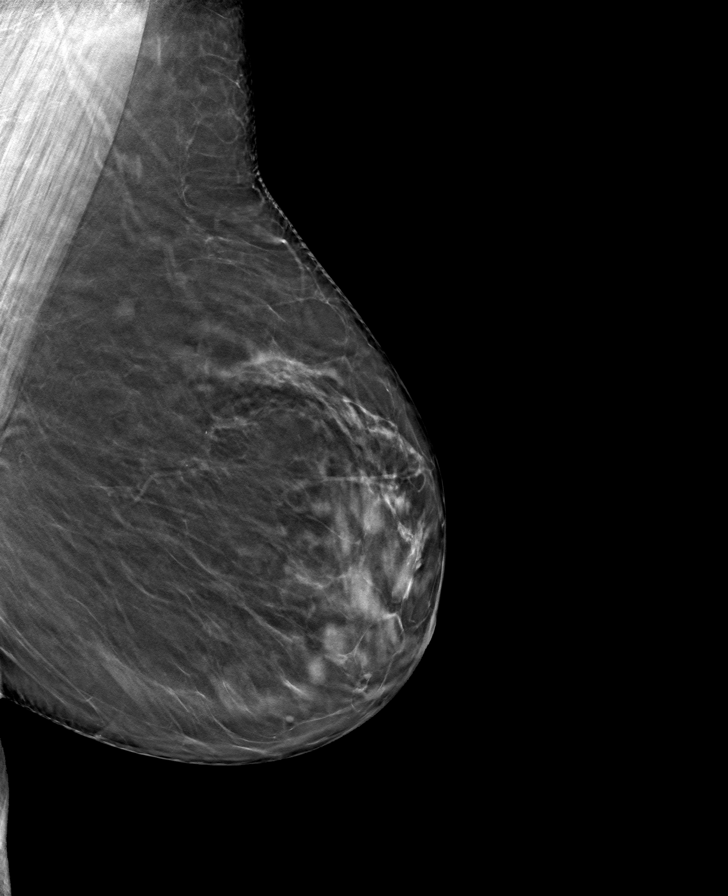

[8 of 24 positions shown; findings below may reference images not displayed]

ACR Breast Density Category b: There are scattered areas of
fibroglandular density.
FINDINGS: There are no findings suspicious for malignancy. The images were
evaluated with computer-aided detection.
IMPRESSION: No mammographic evidence of malignancy. A result letter of this
screening mammogram will be mailed directly to the patient.

RECOMMENDATION:
Screening mammogram in one year. (Code:WJ-I-BG6)

BI-RADS CATEGORY  1: Negative.

## 2021-04-23 ENCOUNTER — Other Ambulatory Visit (HOSPITAL_COMMUNITY): Payer: Self-pay

## 2021-04-29 ENCOUNTER — Ambulatory Visit (INDEPENDENT_AMBULATORY_CARE_PROVIDER_SITE_OTHER): Payer: 59 | Admitting: Physician Assistant

## 2021-04-29 ENCOUNTER — Other Ambulatory Visit: Payer: Self-pay

## 2021-04-29 ENCOUNTER — Encounter (INDEPENDENT_AMBULATORY_CARE_PROVIDER_SITE_OTHER): Payer: Self-pay | Admitting: Physician Assistant

## 2021-04-29 VITALS — BP 98/66 | HR 75 | Temp 98.2°F | Ht 63.0 in | Wt 179.0 lb

## 2021-04-29 DIAGNOSIS — Z6835 Body mass index (BMI) 35.0-35.9, adult: Secondary | ICD-10-CM

## 2021-04-29 DIAGNOSIS — R7303 Prediabetes: Secondary | ICD-10-CM

## 2021-05-06 NOTE — Progress Notes (Signed)
Chief Complaint:   OBESITY Morgan Williams is here to discuss her progress with her obesity treatment plan along with follow-up of her obesity related diagnoses. Morgan Williams is on the Category 2 Plan and states she is following her eating plan approximately 50% of the time. Morgan Williams states she is doing 0 minutes 0 times per week.  Today's visit was #: 50 Starting weight: 219 lbs Starting date: 09/20/2018 Today's weight: 179 lbs Today's date: 04/29/2021 Total lbs lost to date: 40 Total lbs lost since last in-office visit: 0  Interim History: Morgan Williams has had a lot of stress since her mom became sick and she is now in a skilled nursing facility. She does well with the plan during the day but she struggles with dinner due to getting home so late after seeing her mom.  Subjective:   1. Pre-diabetes Morgan Williams is not on medications and she denies polyphagia.  Assessment/Plan:   1. Pre-diabetes Morgan Williams will continue with her meal plan, and will continue to work on weight loss, exercise, and decreasing simple carbohydrates to help decrease the risk of diabetes.   2. Class 2 severe obesity with serious comorbidity and body mass index (BMI) of 35.0 to 35.9 in adult, unspecified obesity type [name] is currently in the action stage of change. As such, her goal is to continue with weight loss efforts. She has agreed to the Category 2 Plan and keeping a food journal and adhering to recommended goals of 400-500 calories and 35 grams of protein at supper daily.   Exercise goals: No exercise has been prescribed at this time.  Behavioral modification strategies: increasing lean protein intake and no skipping meals.  Adam has agreed to follow-up with our clinic in 2 to 3 weeks. She was informed of the importance of frequent follow-up visits to maximize her success with intensive lifestyle modifications for her multiple health conditions.   Objective:   Blood pressure 98/66, pulse 75, temperature 98.2 F (36.8 C),  height 5\' 3"  (1.6 m), weight 179 lb (81.2 kg), last menstrual period 11/01/2003, SpO2 96 %. Body mass index is 31.71 kg/m.  General: Cooperative, alert, well developed, in no acute distress. HEENT: Conjunctivae and lids unremarkable. Cardiovascular: Regular rhythm.  Lungs: Normal work of breathing. Neurologic: No focal deficits.   Lab Results  Component Value Date   CREATININE 0.80 12/24/2020   BUN 20 12/24/2020   NA 143 12/24/2020   K 3.8 12/24/2020   CL 103 12/24/2020   CO2 24 12/24/2020   Lab Results  Component Value Date   ALT 13 12/24/2020   AST 18 12/24/2020   ALKPHOS 72 12/24/2020   BILITOT 0.5 12/24/2020   Lab Results  Component Value Date   HGBA1C 5.5 12/24/2020   HGBA1C 5.8 (H) 07/28/2020   HGBA1C 5.7 (H) 02/28/2020   HGBA1C 5.6 09/05/2019   HGBA1C 5.5 05/02/2019   Lab Results  Component Value Date   INSULIN 15.8 12/24/2020   INSULIN 12.7 07/28/2020   INSULIN 8.3 09/05/2019   INSULIN 11.3 05/02/2019   INSULIN 17.2 09/20/2018   Lab Results  Component Value Date   TSH 1.680 07/28/2020   Lab Results  Component Value Date   CHOL 127 12/24/2020   HDL 39 (L) 12/24/2020   LDLCALC 66 12/24/2020   TRIG 122 12/24/2020   CHOLHDL 3.3 12/24/2020   Lab Results  Component Value Date   VD25OH 66.2 12/24/2020   VD25OH 72.1 07/28/2020   VD25OH 46.0 09/05/2019   Lab Results  Component Value Date   WBC 7.3 02/28/2020   HGB 12.2 02/28/2020   HCT 37.8 02/28/2020   MCV 95.0 02/28/2020   PLT 189 02/28/2020   No results found for: IRON, TIBC, FERRITIN  Attestation Statements:   Reviewed by clinician on day of visit: allergies, medications, problem list, medical history, surgical history, family history, social history, and previous encounter notes.  Time spent on visit including pre-visit chart review and post-visit care and charting was 31 minutes.    Wilhemena Durie, am acting as transcriptionist for [employer], PA-C.  I have reviewed the above  documentation for accuracy and completeness, and I agree with the above. Abby Potash, PA-C

## 2021-05-11 ENCOUNTER — Other Ambulatory Visit (HOSPITAL_COMMUNITY): Payer: Self-pay

## 2021-05-11 MED ORDER — AMOXICILLIN 500 MG PO CAPS
2000.0000 mg | ORAL_CAPSULE | ORAL | 0 refills | Status: DC
  Filled 2021-05-11: qty 8, 2d supply, fill #0

## 2021-05-14 ENCOUNTER — Other Ambulatory Visit (HOSPITAL_BASED_OUTPATIENT_CLINIC_OR_DEPARTMENT_OTHER): Payer: Self-pay

## 2021-05-14 ENCOUNTER — Ambulatory Visit: Payer: 59 | Attending: Internal Medicine

## 2021-05-14 DIAGNOSIS — Z23 Encounter for immunization: Secondary | ICD-10-CM

## 2021-05-14 MED ORDER — PFIZER-BIONT COVID-19 VAC-TRIS 30 MCG/0.3ML IM SUSP
INTRAMUSCULAR | 0 refills | Status: DC
  Filled 2021-05-14: qty 0.3, 1d supply, fill #0

## 2021-05-14 NOTE — Progress Notes (Signed)
   Covid-19 Vaccination Clinic  Name:  Morgan Williams    MRN: [id number] DOB: [birth year]  05/14/2021  [name] was observed post Covid-19 immunization for 15 minutes without incident. She was provided with Vaccine Information Sheet and instruction to access the V-Safe system.   [name] was instructed to call 911 with any severe reactions post vaccine: Difficulty breathing  Swelling of face and throat  A fast heartbeat  A bad rash all over body  Dizziness and weakness   Immunizations Administered     Name Date Dose VIS Date Route   PFIZER Comrnaty(Gray TOP) Covid-19 Vaccine 05/14/2021  2:05 PM 0.3 mL 10/08/2020 Intramuscular   Manufacturer: [place]   Lot: [id number]   [place]: [phone]

## 2021-05-18 ENCOUNTER — Ambulatory Visit (INDEPENDENT_AMBULATORY_CARE_PROVIDER_SITE_OTHER): Payer: 59 | Admitting: Physician Assistant

## 2021-06-02 ENCOUNTER — Other Ambulatory Visit: Payer: Self-pay

## 2021-06-04 ENCOUNTER — Ambulatory Visit
Admit: 2021-06-04 | Discharge: 2021-06-09 | Payer: MEDICAID | Attending: Student in an Organized Health Care Education/Training Program | Primary: Family Medicine

## 2021-06-04 DIAGNOSIS — T63484A Toxic effect of venom of other arthropod, undetermined, initial encounter: Secondary | ICD-10-CM

## 2021-06-04 MED ORDER — HYDROCORTISONE 2.5 % EX OINT
2.5 | CUTANEOUS | 1 refills | Status: AC
Start: 2021-06-04 — End: 2021-06-11

## 2021-06-04 MED ORDER — AMOXICILLIN-POT CLAVULANATE 875-125 MG PO TABS
875-125 MG | ORAL_TABLET | Freq: Two times a day (BID) | ORAL | 0 refills | Status: AC
Start: 2021-06-04 — End: 2021-06-09

## 2021-06-04 MED ORDER — EPINEPHRINE 0.15 MG/0.3ML IJ SOAJ
0.15 | INTRAMUSCULAR | 3 refills | Status: DC
Start: 2021-06-04 — End: 2021-06-08

## 2021-06-04 NOTE — Assessment & Plan Note (Signed)
Uncontrolled, unknown insect sting with extensive localized reactions.  We will send her Augmentin 875-125 mg twice daily for 5 days.  Discussed with her that if worsening symptoms to call and we can do Doxy for deeper skin tissue penetration.  Also printed information about insect bites and things she can do at home including continuing to ice and doing antihistamine over-the-counter as well as soothing lotions.

## 2021-06-04 NOTE — Telephone Encounter (Signed)
S:  Patient is calling the [facility] with complaint of worsening insect bite. Posterior left thigh .      B:  Onset 06/02/2021     A:  Patient was seen today in office for insect bite.  Patient states she feels area is getting bigger .  Patient reports continued redness and warmth to touch.   Did take one dose of antibiotic.     No red streaks or lines - patient states area is more tender to touch than it was previously.    No blisters, no purplish skin.   No fever.  Patient advised if she felt area was significantly worse, she could be seen in UC.  Patient states she will continue to take antibiotic and treat at home at this time.       R:   Home Care advice given for insect bite per protocol.  Patient instructed to call back with new or worsening symptoms.  Patient verbalized understanding    Message to the office for review by the provider .      Reason for Disposition   Painful insect bite    Protocols used: Insect Bite-ADULT-AH
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
## 2021-06-08 MED ORDER — EPINEPHRINE 0.3 MG/0.3ML IJ SOAJ
0.3 | INTRAMUSCULAR | 1 refills | Status: AC
Start: 2021-06-08 — End: ?

## 2021-06-08 NOTE — Addendum Note (Signed)
Addended by: Caron Presume on: 06/08/2021 06:57 AM     Modules accepted: Orders

## 2021-06-10 ENCOUNTER — Ambulatory Visit (HOSPITAL_BASED_OUTPATIENT_CLINIC_OR_DEPARTMENT_OTHER): Payer: 59 | Admitting: Obstetrics & Gynecology

## 2021-06-10 ENCOUNTER — Ambulatory Visit (INDEPENDENT_AMBULATORY_CARE_PROVIDER_SITE_OTHER): Payer: 59 | Admitting: Physician Assistant

## 2021-06-14 ENCOUNTER — Other Ambulatory Visit: Payer: Self-pay

## 2021-06-14 ENCOUNTER — Other Ambulatory Visit (HOSPITAL_COMMUNITY): Payer: Self-pay

## 2021-06-14 ENCOUNTER — Ambulatory Visit (INDEPENDENT_AMBULATORY_CARE_PROVIDER_SITE_OTHER): Payer: 59 | Admitting: Family Medicine

## 2021-06-14 ENCOUNTER — Encounter (INDEPENDENT_AMBULATORY_CARE_PROVIDER_SITE_OTHER): Payer: Self-pay | Admitting: Family Medicine

## 2021-06-14 VITALS — BP 108/70 | HR 76 | Temp 98.0°F | Ht 63.0 in | Wt 180.0 lb

## 2021-06-14 DIAGNOSIS — R7301 Impaired fasting glucose: Secondary | ICD-10-CM | POA: Diagnosis not present

## 2021-06-14 DIAGNOSIS — Z6835 Body mass index (BMI) 35.0-35.9, adult: Secondary | ICD-10-CM | POA: Diagnosis not present

## 2021-06-14 DIAGNOSIS — I10 Essential (primary) hypertension: Secondary | ICD-10-CM | POA: Diagnosis not present

## 2021-06-14 DIAGNOSIS — E559 Vitamin D deficiency, unspecified: Secondary | ICD-10-CM

## 2021-06-14 DIAGNOSIS — Z9189 Other specified personal risk factors, not elsewhere classified: Secondary | ICD-10-CM | POA: Diagnosis not present

## 2021-06-14 DIAGNOSIS — E66812 Obesity, class 2: Secondary | ICD-10-CM

## 2021-06-14 MED ORDER — TIRZEPATIDE 5 MG/0.5ML ~~LOC~~ SOAJ
5.0000 mg | SUBCUTANEOUS | 0 refills | Status: DC
  Filled 2021-06-14: qty 2, 28d supply, fill #0

## 2021-06-14 MED ORDER — TIRZEPATIDE 7.5 MG/0.5ML ~~LOC~~ SOAJ
7.5000 mg | SUBCUTANEOUS | 0 refills | Status: DC
  Filled 2021-06-14 – 2021-07-14 (×2): qty 2, 28d supply, fill #0

## 2021-06-15 ENCOUNTER — Ambulatory Visit (INDEPENDENT_AMBULATORY_CARE_PROVIDER_SITE_OTHER): Payer: 59 | Admitting: Physician Assistant

## 2021-06-16 ENCOUNTER — Other Ambulatory Visit (HOSPITAL_COMMUNITY): Payer: Self-pay

## 2021-06-16 MED ORDER — ALPRAZOLAM 0.5 MG PO TABS
0.2500 mg | ORAL_TABLET | Freq: Every evening | ORAL | 0 refills | Status: DC | PRN
  Filled 2021-06-16: qty 30, 30d supply, fill #0

## 2021-06-16 NOTE — Progress Notes (Signed)
Chief Complaint:   OBESITY Morgan Williams is here to discuss her progress with her obesity treatment plan along with follow-up of her obesity related diagnoses.   Today's visit was #: 65 Starting weight: 219 lbs Starting date: 09/20/2018 Today's weight: 180 lbs Today's date: 06/14/2021 Weight change since last visit: +1 lb Total lbs lost to date: 39 lbs Body mass index is 31.89 kg/m.  Total weight loss percentage to date: -17.81%  Current Meal Plan: Category 2 Plan and keeping a food journal and adhering to recommended goals of 400-500 calories and 35 grams of protein at supper daily for 50-60% of the time.  Current Exercise Plan: Increased walking.  Current Anti-Obesity Medications: Wegovy 2.4 mg subcutaneously weekly. Side effects: None.  Interim History: Morgan Williams tolerates the Wegovy 2.4 mg with no polyphagia but is still struggling with emotional eating especially at night. 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 Her mom is in a skilled nursing facility and she will stay with her until late evening. She does well with breakfast and lunch, but then eats fast food for dinner.  Assessment/Plan:   1. Impaired fasting glucose Morgan Williams will continue with her meal plan, and will continue to work on weight loss, exercise, and decreasing simple carbohydrates to help decrease the risk of diabetes. Stop taking Ozempic and start Mounjaro 5 mg subcutaneously weekly, then increase to 7.5 mg.  - Start tirzepatide [employer]) 5 MG/0.5ML Pen; Inject 5 mg into the skin once a week.  Dispense: 2 mL; Refill: 0 - Then start tirzepatide (MOUNJARO) 7.5 MG/0.5ML Pen; Inject 7.5 mg into the skin once a week.  Dispense: 2 mL; Refill: 0  2. Essential hypertension with hyperlipidemia Controlled. Medications: Cozaar 50 mg daily, Hydrochlorothiazide 12.5 mg daily, Zocor 40 mg daily.   Plan: Avoid buying foods that are: processed, frozen, or prepackaged to avoid excess salt. Increase soluble fiber, decrease simple  carbohydrates, decrease saturated fat. Exercise changes: Moderate to vigorous-intensity aerobic activity 150 minutes per week or as tolerated. We will continue to monitor along with PCP/specialists as it pertains to her weight loss journey.  BP Readings from Last 3 Encounters:  06/14/21 108/70  04/29/21 98/66  04/05/21 106/69   Lab Results  Component Value Date   CREATININE 0.80 12/24/2020   Lab Results  Component Value Date   CHOL 127 12/24/2020   HDL 39 (L) 12/24/2020   LDLCALC 66 12/24/2020   TRIG 122 12/24/2020   CHOLHDL 3.3 12/24/2020   Lab Results  Component Value Date   ALT 13 12/24/2020   AST 18 12/24/2020   ALKPHOS 72 12/24/2020   BILITOT 0.5 12/24/2020   3. Vitamin D deficiency At goal.   Plan: Continue to take prescription Vitamin D '@50'$ ,000 IU every week as prescribed.  Follow-up for routine testing of Vitamin D, at least 2-3 times per year to avoid over-replacement.  Lab Results  Component Value Date   VD25OH 66.2 12/24/2020   VD25OH 72.1 07/28/2020   VD25OH 46.0 09/05/2019   4. At risk for heart disease Due to Morgan Williams's current state of health and medical condition(s), she is at a higher risk for heart disease.   This puts the patient at much greater risk to subsequently develop cardiopulmonary conditions that can significantly affect patient's quality of life in a negative manner as well.    At least 8 minutes was spent on counseling Morgan Williams about these concerns today. Initial goal is to lose at least 5-10% of starting weight to help  reduce these risk factors.  We will continue to reassess these conditions on a fairly regular basis in an attempt to decrease patient's overall morbidity and mortality.  Evidence-based interventions for health behavior change were utilized today including the discussion of self monitoring techniques, problem-solving barriers and SMART goal setting techniques.  Specifically regarding patient's less desirable eating habits and patterns,  we employed the technique of small changes when Morgan Williams has not been able to fully commit to her prudent nutritional plan.  5. Obesity, current BMI 32 Course: Morgan Williams is currently in the action stage of change. As such, her goal is to continue with weight loss efforts.   Nutrition goals: She has agreed to the Category 2 Plan and keeping a food journal and adhering to recommended goals of 400-500 calories and 35 grams of protein at supper daily.   Discussed meal prep groups.   Exercise goals: As is.  Behavioral modification strategies: increasing lean protein intake, decreasing simple carbohydrates, increasing vegetables, increasing water intake, and emotional eating strategies.  Morgan Williams has agreed to follow-up with our clinic in 4 weeks. She was informed of the importance of frequent follow-up visits to maximize her success with intensive lifestyle modifications for her multiple health conditions.   Objective:   Blood pressure 108/70, pulse 76, temperature 98 F (36.7 C), temperature source Oral, height '5\' 3"'$  (1.6 m), weight 180 lb (81.6 kg), last menstrual period 11/01/2003, SpO2 98 %. Body mass index is 31.89 kg/m.  General: Cooperative, alert, well developed, in no acute distress. HEENT: Conjunctivae and lids unremarkable. Cardiovascular: Regular rhythm.  Lungs: Normal work of breathing. Neurologic: No focal deficits.   Lab Results  Component Value Date   CREATININE 0.80 12/24/2020   BUN 20 12/24/2020   NA 143 12/24/2020   K 3.8 12/24/2020   CL 103 12/24/2020   CO2 24 12/24/2020   Lab Results  Component Value Date   ALT 13 12/24/2020   AST 18 12/24/2020   ALKPHOS 72 12/24/2020   BILITOT 0.5 12/24/2020   Lab Results  Component Value Date   HGBA1C 5.5 12/24/2020   HGBA1C 5.8 (H) 07/28/2020   HGBA1C 5.7 (H) 02/28/2020   HGBA1C 5.6 09/05/2019   HGBA1C 5.5 05/02/2019   Lab Results  Component Value Date   INSULIN 15.8 12/24/2020   INSULIN 12.7 07/28/2020   INSULIN 8.3  09/05/2019   INSULIN 11.3 05/02/2019   INSULIN 17.2 09/20/2018   Lab Results  Component Value Date   TSH 1.680 07/28/2020   Lab Results  Component Value Date   CHOL 127 12/24/2020   HDL 39 (L) 12/24/2020   LDLCALC 66 12/24/2020   TRIG 122 12/24/2020   CHOLHDL 3.3 12/24/2020   Lab Results  Component Value Date   VD25OH 66.2 12/24/2020   VD25OH 72.1 07/28/2020   VD25OH 46.0 09/05/2019   Lab Results  Component Value Date   WBC 7.3 02/28/2020   HGB 12.2 02/28/2020   HCT 37.8 02/28/2020   MCV 95.0 02/28/2020   PLT 189 02/28/2020   Attestation Statements:   Reviewed by clinician on day of visit: allergies, medications, problem list, medical history, surgical history, family history, social history, and previous encounter notes.  Leodis Binet Friedenbach, CMA, am acting as Location manager for [employer], DO.  I have reviewed the above documentation for accuracy and completeness, and I agree with the above. Briscoe Deutscher, DO

## 2021-06-21 ENCOUNTER — Encounter

## 2021-06-25 DIAGNOSIS — H40023 Open angle with borderline findings, high risk, bilateral: Secondary | ICD-10-CM | POA: Diagnosis not present

## 2021-06-25 DIAGNOSIS — H353131 Nonexudative age-related macular degeneration, bilateral, early dry stage: Secondary | ICD-10-CM | POA: Diagnosis not present

## 2021-06-25 DIAGNOSIS — E119 Type 2 diabetes mellitus without complications: Secondary | ICD-10-CM | POA: Diagnosis not present

## 2021-06-25 DIAGNOSIS — H524 Presbyopia: Secondary | ICD-10-CM | POA: Diagnosis not present

## 2021-06-25 DIAGNOSIS — H2513 Age-related nuclear cataract, bilateral: Secondary | ICD-10-CM | POA: Diagnosis not present

## 2021-06-29 DIAGNOSIS — Z7689 Persons encountering health services in other specified circumstances: Secondary | ICD-10-CM | POA: Diagnosis not present

## 2021-06-29 DIAGNOSIS — R21 Rash and other nonspecific skin eruption: Secondary | ICD-10-CM | POA: Diagnosis not present

## 2021-06-29 DIAGNOSIS — Z6833 Body mass index (BMI) 33.0-33.9, adult: Secondary | ICD-10-CM | POA: Diagnosis not present

## 2021-06-29 DIAGNOSIS — W57XXXA Bitten or stung by nonvenomous insect and other nonvenomous arthropods, initial encounter: Secondary | ICD-10-CM | POA: Diagnosis not present

## 2021-07-01 ENCOUNTER — Other Ambulatory Visit (HOSPITAL_COMMUNITY): Payer: Self-pay

## 2021-07-07 ENCOUNTER — Other Ambulatory Visit (HOSPITAL_COMMUNITY): Payer: Self-pay

## 2021-07-09 DIAGNOSIS — E039 Hypothyroidism, unspecified: Secondary | ICD-10-CM | POA: Diagnosis not present

## 2021-07-09 DIAGNOSIS — R7303 Prediabetes: Secondary | ICD-10-CM | POA: Diagnosis not present

## 2021-07-09 DIAGNOSIS — N182 Chronic kidney disease, stage 2 (mild): Secondary | ICD-10-CM | POA: Diagnosis not present

## 2021-07-09 DIAGNOSIS — N281 Cyst of kidney, acquired: Secondary | ICD-10-CM | POA: Diagnosis not present

## 2021-07-09 DIAGNOSIS — I129 Hypertensive chronic kidney disease with stage 1 through stage 4 chronic kidney disease, or unspecified chronic kidney disease: Secondary | ICD-10-CM | POA: Diagnosis not present

## 2021-07-09 DIAGNOSIS — E785 Hyperlipidemia, unspecified: Secondary | ICD-10-CM | POA: Diagnosis not present

## 2021-07-14 ENCOUNTER — Other Ambulatory Visit (HOSPITAL_COMMUNITY): Payer: Self-pay

## 2021-07-14 MED FILL — Losartan Potassium Tab 50 MG: ORAL | 90 days supply | Qty: 90 | Fill #1 | Status: AC

## 2021-07-14 MED FILL — Hydrochlorothiazide Cap 12.5 MG: ORAL | 90 days supply | Qty: 90 | Fill #1 | Status: AC

## 2021-07-14 MED FILL — Famotidine Tab 20 MG: ORAL | 90 days supply | Qty: 180 | Fill #1 | Status: AC

## 2021-07-14 MED FILL — Levothyroxine Sodium Tab 100 MCG: ORAL | 90 days supply | Qty: 90 | Fill #0 | Status: AC

## 2021-07-14 MED FILL — Simvastatin Tab 40 MG: ORAL | 90 days supply | Qty: 90 | Fill #1 | Status: AC

## 2021-07-27 ENCOUNTER — Other Ambulatory Visit (HOSPITAL_COMMUNITY): Payer: Self-pay

## 2021-07-27 ENCOUNTER — Ambulatory Visit (INDEPENDENT_AMBULATORY_CARE_PROVIDER_SITE_OTHER): Payer: 59 | Admitting: Family Medicine

## 2021-07-27 ENCOUNTER — Other Ambulatory Visit: Payer: Self-pay

## 2021-07-27 ENCOUNTER — Encounter (INDEPENDENT_AMBULATORY_CARE_PROVIDER_SITE_OTHER): Payer: Self-pay | Admitting: Family Medicine

## 2021-07-27 VITALS — BP 99/64 | HR 81 | Temp 98.0°F | Ht 63.0 in | Wt 181.0 lb

## 2021-07-27 DIAGNOSIS — E038 Other specified hypothyroidism: Secondary | ICD-10-CM | POA: Diagnosis not present

## 2021-07-27 DIAGNOSIS — R7303 Prediabetes: Secondary | ICD-10-CM | POA: Diagnosis not present

## 2021-07-27 DIAGNOSIS — Z9189 Other specified personal risk factors, not elsewhere classified: Secondary | ICD-10-CM

## 2021-07-27 DIAGNOSIS — E559 Vitamin D deficiency, unspecified: Secondary | ICD-10-CM | POA: Diagnosis not present

## 2021-07-27 DIAGNOSIS — R7301 Impaired fasting glucose: Secondary | ICD-10-CM | POA: Diagnosis not present

## 2021-07-27 DIAGNOSIS — E7849 Other hyperlipidemia: Secondary | ICD-10-CM | POA: Diagnosis not present

## 2021-07-27 DIAGNOSIS — Z6835 Body mass index (BMI) 35.0-35.9, adult: Secondary | ICD-10-CM | POA: Diagnosis not present

## 2021-07-27 MED ORDER — TIRZEPATIDE 12.5 MG/0.5ML ~~LOC~~ SOAJ
12.5000 mg | SUBCUTANEOUS | 0 refills | Status: DC
  Filled 2021-07-27: qty 2, 28d supply, fill #0

## 2021-07-28 LAB — CBC WITH DIFFERENTIAL/PLATELET
Basophils Absolute: 0 10*3/uL (ref 0.0–0.2)
Basos: 0 %
EOS (ABSOLUTE): 0.2 10*3/uL (ref 0.0–0.4)
Eos: 3 %
Hematocrit: 37.2 % (ref 34.0–46.6)
Hemoglobin: 12.6 g/dL (ref 11.1–15.9)
Immature Grans (Abs): 0 10*3/uL (ref 0.0–0.1)
Immature Granulocytes: 0 %
Lymphocytes Absolute: 1.7 10*3/uL (ref 0.7–3.1)
Lymphs: 24 %
MCH: 31 pg (ref 26.6–33.0)
MCHC: 33.9 g/dL (ref 31.5–35.7)
MCV: 91 fL (ref 79–97)
Monocytes Absolute: 0.7 10*3/uL (ref 0.1–0.9)
Monocytes: 9 %
Neutrophils Absolute: 4.6 10*3/uL (ref 1.4–7.0)
Neutrophils: 64 %
Platelets: 195 10*3/uL (ref 150–450)
RBC: 4.07 x10E6/uL (ref 3.77–5.28)
RDW: 12.6 % (ref 11.7–15.4)
WBC: 7.3 10*3/uL (ref 3.4–10.8)

## 2021-07-28 LAB — COMPREHENSIVE METABOLIC PANEL
ALT: 13 IU/L (ref 0–32)
AST: 16 IU/L (ref 0–40)
Albumin/Globulin Ratio: 2 (ref 1.2–2.2)
Albumin: 4.7 g/dL (ref 3.8–4.8)
Alkaline Phosphatase: 78 IU/L (ref 44–121)
BUN/Creatinine Ratio: 24 (ref 12–28)
BUN: 20 mg/dL (ref 8–27)
Bilirubin Total: 0.5 mg/dL (ref 0.0–1.2)
CO2: 22 mmol/L (ref 20–29)
Calcium: 9.9 mg/dL (ref 8.7–10.3)
Chloride: 102 mmol/L (ref 96–106)
Creatinine, Ser: 0.82 mg/dL (ref 0.57–1.00)
Globulin, Total: 2.4 g/dL (ref 1.5–4.5)
Glucose: 84 mg/dL (ref 70–99)
Potassium: 3.8 mmol/L (ref 3.5–5.2)
Sodium: 140 mmol/L (ref 134–144)
Total Protein: 7.1 g/dL (ref 6.0–8.5)
eGFR: 80 mL/min/{1.73_m2} (ref >59)

## 2021-07-28 LAB — LIPID PANEL
Chol/HDL Ratio: 3.1 ratio (ref 0.0–4.4)
Cholesterol, Total: 129 mg/dL (ref 100–199)
HDL: 42 mg/dL (ref >39)
LDL Chol Calc (NIH): 68 mg/dL (ref 0–99)
Triglycerides: 102 mg/dL (ref 0–149)
VLDL Cholesterol Cal: 19 mg/dL (ref 5–40)

## 2021-07-28 LAB — HEMOGLOBIN A1C
Est. average glucose Bld gHb Est-mCnc: 114 mg/dL
Hgb A1c MFr Bld: 5.6 % (ref 4.8–5.6)

## 2021-07-28 LAB — VITAMIN D 25 HYDROXY (VIT D DEFICIENCY, FRACTURES): Vit D, 25-Hydroxy: 77.6 ng/mL (ref 30.0–100.0)

## 2021-07-28 LAB — INSULIN, RANDOM: INSULIN: 13.5 u[IU]/mL (ref 2.6–24.9)

## 2021-07-28 LAB — T4, FREE: Free T4: 1.45 ng/dL (ref 0.82–1.77)

## 2021-07-28 LAB — TSH: TSH: 0.959 u[IU]/mL (ref 0.450–4.500)

## 2021-07-28 NOTE — Progress Notes (Signed)
Chief Complaint:   OBESITY Morgan Williams is here to discuss her progress with her obesity treatment plan along with follow-up of her obesity related diagnoses.   Today's visit was #: 75 Starting weight: 219 lbs Starting date: 09/20/2018 Today's weight: 181 lbs Today's date: 07/27/2021 Weight change since last visit: +1 lb Total lbs lost to date: 38 lbs Body mass index is 32.06 kg/m.  Total weight loss percentage to date: -17.35%  Current Meal Plan: the Category 2 Plan and keeping a food journal and adhering to recommended goals of 400-500 calories and 35 grams of protein at supper for 50% of the time.  Current Exercise Plan: Increased walking. Current Anti-Obesity Medications: Mounjaro 7.5 mg subcutaneously weekly. Side effects: None.  Interim History:  Morgan Williams was in [place] for vacation and had lobster for the first time.  She says she ate as much as possible.  She has had increased polyphagia since changing Wegovy 2.4 mg to Mounjaro 7.5 mg.  No side effects from [facility].  Assessment/Plan:   1. Impaired fasting glucose, with polyphagia Not at goal. Current treatment: Mounjaro 7.5 mg subcutaneously weekly. She will continue to focus on protein-rich, low simple carbohydrate foods. We reviewed the importance of hydration, regular exercise for stress reduction, and restorative sleep.  Plan:  Will increase Mounjaro 12.5 mg subcutaneously weekly.  - Increase tirzepatide (MOUNJARO) 12.5 MG/0.5ML Pen; Inject 12.5 mg into the skin once a week.  Dispense: 2 mL; Refill: 0  2. Vitamin D deficiency At goal.  She is taking vitamin D 50,000 IU weekly.  Plan: Continue to take prescription Vitamin D @50 ,000 IU every week as prescribed.  Will check vitamin D level today, as per below.  Lab Results  Component Value Date   VD25OH 77.6 07/27/2021   VD25OH 66.2 12/24/2020   VD25OH 72.1 07/28/2020   - VITAMIN D 25 Hydroxy (Vit-D Deficiency, Fractures)  3. Prediabetes At goal. Goal is HgbA1c <  5.7.  Medication: Mounjaro 7.5 mg subcutaneously weekly.    Plan:  She will continue to focus on protein-rich, low simple carbohydrate foods. We reviewed the importance of hydration, regular exercise for stress reduction, and restorative sleep.  Will check labs today.  Lab Results  Component Value Date   HGBA1C 5.6 07/27/2021   Lab Results  Component Value Date   INSULIN 13.5 07/27/2021   INSULIN 15.8 12/24/2020   INSULIN 12.7 07/28/2020   INSULIN 8.3 09/05/2019   INSULIN 11.3 05/02/2019   - CBC with Differential/Platelet - Comprehensive metabolic panel - Hemoglobin A1c - Insulin, random  4. Other hyperlipidemia Course: At goal. Lipid-lowering medications: Zocor 40 mg daily.   Plan: Dietary changes: Increase soluble fiber, decrease simple carbohydrates, decrease saturated fat. Exercise changes: Moderate to vigorous-intensity aerobic activity 150 minutes per week or as tolerated. We will continue to monitor along with PCP/specialists as it pertains to her weight loss journey.  Will check lipid panel today.  Lab Results  Component Value Date   CHOL 129 07/27/2021   HDL 42 07/27/2021   LDLCALC 68 07/27/2021   TRIG 102 07/27/2021   CHOLHDL 3.1 07/27/2021   Lab Results  Component Value Date   ALT 13 07/27/2021   AST 16 07/27/2021   ALKPHOS 78 07/27/2021   BILITOT 0.5 07/27/2021   - Lipid panel  5. Other specified hypothyroidism Course: Controlled. Medication: 188 mcg daily.   Plan: Patient was instructed not to take MVM or iron within 4 hours of taking thyroid medications. We will continue to  monitor alongside Endocrinology/PCP as it relates to her weight loss journey.  Will check TSH and free T4 today.  Lab Results  Component Value Date   TSH 0.959 07/27/2021   - TSH - T4, free  6. At risk for heart disease Due to Morgan Williams's current state of health and medical condition(s), she is at a higher risk for heart disease.  This puts the patient at much greater risk to  subsequently develop cardiopulmonary conditions that can significantly affect patient's quality of life in a negative manner.    At least 8 minutes were spent on counseling Morgan Williams about these concerns today. Evidence-based interventions for health behavior change were utilized today including the discussion of self monitoring techniques, problem-solving barriers, and SMART goal setting techniques.  Specifically, regarding patient's less desirable eating habits and patterns, we employed the technique of small changes when Morgan Williams has not been able to fully commit to her prudent nutritional plan.  7. Obesity, current BMI 32.1  Course: Morgan Williams is currently in the action stage of change. As such, her goal is to continue with weight loss efforts.   Nutrition goals: She has agreed to the Category 2 Plan and keeping a food journal and adhering to recommended goals of 400-500 calories and 35 grams of protein at supper.   Exercise goals:  As is.  Behavioral modification strategies: increasing lean protein intake, decreasing simple carbohydrates, increasing vegetables, and increasing water intake.  Morgan Williams has agreed to follow-up with our clinic in 4 weeks. She was informed of the importance of frequent follow-up visits to maximize her success with intensive lifestyle modifications for her multiple health conditions.   Morgan Williams was informed we would discuss her lab results at her next visit unless there is a critical issue that needs to be addressed sooner. Morgan Williams agreed to keep her next visit at the agreed upon time to discuss these results.  Objective:   Blood pressure 99/64, pulse 81, temperature 98 F (36.7 C), temperature source Oral, height 5\' 3"  (1.6 m), weight 181 lb (82.1 kg), last menstrual period 11/01/2003, SpO2 98 %. Body mass index is 32.06 kg/m.  General: Cooperative, alert, well developed, in no acute distress. HEENT: Conjunctivae and lids unremarkable. Cardiovascular: Regular rhythm.  Lungs:  Normal work of breathing. Neurologic: No focal deficits.   Lab Results  Component Value Date   CREATININE 0.82 07/27/2021   BUN 20 07/27/2021   NA 140 07/27/2021   K 3.8 07/27/2021   CL 102 07/27/2021   CO2 22 07/27/2021   Lab Results  Component Value Date   ALT 13 07/27/2021   AST 16 07/27/2021   ALKPHOS 78 07/27/2021   BILITOT 0.5 07/27/2021   Lab Results  Component Value Date   HGBA1C 5.6 07/27/2021   HGBA1C 5.5 12/24/2020   HGBA1C 5.8 (H) 07/28/2020   HGBA1C 5.7 (H) 02/28/2020   HGBA1C 5.6 09/05/2019   Lab Results  Component Value Date   INSULIN 13.5 07/27/2021   INSULIN 15.8 12/24/2020   INSULIN 12.7 07/28/2020   INSULIN 8.3 09/05/2019   INSULIN 11.3 05/02/2019   Lab Results  Component Value Date   TSH 0.959 07/27/2021   Lab Results  Component Value Date   CHOL 129 07/27/2021   HDL 42 07/27/2021   LDLCALC 68 07/27/2021   TRIG 102 07/27/2021   CHOLHDL 3.1 07/27/2021   Lab Results  Component Value Date   VD25OH 77.6 07/27/2021   VD25OH 66.2 12/24/2020   VD25OH 72.1 07/28/2020   Lab Results  Component Value Date   WBC 7.3 07/27/2021   HGB 12.6 07/27/2021   HCT 37.2 07/27/2021   MCV 91 07/27/2021   PLT 195 07/27/2021   Attestation Statements:   Reviewed by clinician on day of visit: allergies, medications, problem list, medical history, surgical history, family history, social history, and previous encounter notes.  I, Water quality scientist, CMA, am acting as transcriptionist for Briscoe Deutscher, DO  I have reviewed the above documentation for accuracy and completeness, and I agree with the above. Briscoe Deutscher, DO

## 2021-08-02 ENCOUNTER — Other Ambulatory Visit (HOSPITAL_COMMUNITY): Payer: Self-pay

## 2021-08-02 ENCOUNTER — Other Ambulatory Visit (INDEPENDENT_AMBULATORY_CARE_PROVIDER_SITE_OTHER): Payer: Self-pay | Admitting: Family Medicine

## 2021-08-02 DIAGNOSIS — E038 Other specified hypothyroidism: Secondary | ICD-10-CM

## 2021-08-02 MED ORDER — LEVOTHYROXINE SODIUM 88 MCG PO TABS
88.0000 ug | ORAL_TABLET | Freq: Every day | ORAL | 0 refills | Status: DC
  Filled 2021-08-02: qty 90, 90d supply, fill #0

## 2021-08-02 MED ORDER — INFLUENZA VAC SPLIT QUAD 0.5 ML IM SUSY
PREFILLED_SYRINGE | INTRAMUSCULAR | 0 refills | Status: DC
  Filled 2021-08-02: qty 0.5, 1d supply, fill #0

## 2021-08-06 ENCOUNTER — Telehealth

## 2021-08-06 NOTE — Telephone Encounter (Signed)
Patient scheduled for Dx mammogram and [name]. Please sign order

## 2021-08-07 NOTE — Telephone Encounter (Signed)
S:  Patient is calling the [facility] with complaint of eye pain .     B:  Left eye   Started this past week,  New to wearing eye contacts 3 weeks      A:  Pt states has been using same eye cleaner and has not changed out eye solution ,pt has eye discharge ,feels like sand eye with pressure behind eye.Pt states swelling and pain is sharp and stabbing . Pt denies visual changes. Applying cool compress to eye now no relief. Pain 8/10 and constant .       R:   Pt will have family take her to ER. Pt instructed to call back with questions or concerns.     Pt verbalizes understanding.   Pharmacy and allergies verified in the chart.         Reason for Disposition   SEVERE eye pain (e.g., excruciating)    Answer Assessment - Initial Assessment Questions  1. EYE DISCHARGE: "Is the discharge in one or both eyes?" "What color is it?" "How much is there?" "When did the discharge start?"       Started today crusty and yellow   2. REDNESS OF SCLERA: "Is the redness in one or both eyes?" "When did the redness start?"       Yes   3. EYELIDS: "Are the eyelids red or swollen?" If Yes, ask: "How much?"       Red   4. VISION: "Is there any difficulty seeing clearly?"       Denies   5. PAIN: "Is there any pain? If Yes, ask: "How bad is it?" (Scale 1-10; or mild, moderate, severe)     - MILD (1-3): doesn't interfere with normal activities      - MODERATE (4-7): interferes with normal activities or awakens from sleep     - SEVERE (8-10): excruciating pain, unable to do any normal activities        7-8/10  6. CONTACT LENS: "Do you wear contacts?"      Yes   7. OTHER SYMPTOMS: "Do you have any other symptoms?" (e.g., fever, runny nose, cough)      Discharge   8. PREGNANCY: "Is there any chance you are pregnant?" "When was your last menstrual period?"      Denies    Protocols used: Eye - Pus or Discharge-ADULT-AH

## 2021-08-10 NOTE — Telephone Encounter (Signed)
Reason for Disposition   Caller has already spoken with the PCP (or office), and has no further questions    Answer Assessment - Initial Assessment Questions  ,    Protocols used: No Contact or Duplicate Contact Call-ADULT-[place]

## 2021-08-11 ENCOUNTER — Ambulatory Visit: Admit: 2021-08-11 | Discharge: 2021-08-11 | Payer: MEDICAID | Attending: Family | Primary: Family Medicine

## 2021-08-11 DIAGNOSIS — H1033 Unspecified acute conjunctivitis, bilateral: Secondary | ICD-10-CM

## 2021-08-11 NOTE — Progress Notes (Signed)
Tammy Amos APRN CNP  Family Medicine    [facility]  [address] [place] [address]  Dept: [phone]  Loc: [phone]    Reason for Appointment:     Tammy Bruce is a 65 y.o. female who presents today for:    Chief Complaint   Patient presents with    Other     [facility], they said pt have infection on bilateral eye, worse is Left eye. Pt states having bilateral eye itchy, burring, blurry vision, discomfort. Pt states using TOBREX eye drops, pt states not working.       Assessments/Plan:     Acute conjunctivitis of both eyes, unspecified acute conjunctivitis type  Left eye pain  -Pt continues tobrex gtts for bilateral eye symptome L>R  -Considered changing therapy.  As she is scheduled w ophthalmology later this afternoon, advised to continue same gtts at this time  -Recommend to report to ED for symptoms of increased pain or vision loss  There are no discontinued medications.      Return if symptoms worsen or fail to improve.        Electronically signed by Tammy Amos, APRN - CNP on 08/11/21     Subjective:     HPI  New contact wearer x 3 week  Saturday 10/8 she started to have some irritation  [facility] and using tobrex gtts since-4days now-  Eye is painful on left >Right  Sensations is pressure and pain, left eye is painful, gritty, burning, light sensitivity  Some blurry vision on left, both eyes red  Drainage that is gooey and stringly, tan in color, crusted in the AM  Is adamant that she will not returning to ED with symptoms of worsening pain and blurry vision.    Review of Systems   Constitutional:  Negative for fatigue and fever.   HENT:  Negative for congestion, ear pain, postnasal drip and rhinorrhea.    Eyes:  Positive for photophobia, pain, discharge, redness, itching and visual disturbance.   Neurological:  Negative for dizziness and headaches.    Psychiatric/Behavioral:  The patient is nervous/anxious.    As per HPI  Current Outpatient Medications on File Prior to Visit   Medication Sig Dispense Refill    tobramycin (TOBREX) 0.3 % ophthalmic solution ADMINISTER 1-2 DROPS TO BOTH EYES EVERY 4 HOURS FOR 7 DAYS.      EPINEPHrine (EPIPEN) 0.3 MG/0.3ML SOAJ injection Use as directed for allergic reaction 1 each 1    famotidine (PEPCID) 20 MG tablet TAKE 1 TABLET BY MOUTH TWICE A DAY 60 tablet 3    ESTRING 2 MG vaginal ring 2 mg      PREMARIN 0.625 MG/GM vaginal cream USE 1 GRAM VAGINALLY ONCE EACH WEEK.  3     No current facility-administered medications on file prior to visit.     History reviewed. No pertinent past medical history.  History reviewed. No pertinent surgical history.  Family History   Problem Relation Age of Onset    Osteoporosis Mother     Heart Disease Father      Social History     Socioeconomic History    Marital status: Married     Spouse name: Not on file    Number of children: Not on file    Years of education: Not on file    Highest education level:  Not on file   Occupational History    Not on file   Tobacco Use    Smoking status: Never    Smokeless tobacco: Never   Substance and Sexual Activity    Alcohol use: Not on file    Drug use: Not on file    Sexual activity: Not on file   Other Topics Concern    Not on file   Social History Narrative    Not on file     Social Determinants of Health     Financial Resource Strain: Low Risk     Difficulty of Paying Living Expenses: Not hard at all   Food Insecurity: No Food Insecurity    Worried About Running Out of Food in the Last Year: Never true    Ran Out of Food in the Last Year: Never true   Transportation Needs: Not on file   Physical Activity: Not on file   Stress: Not on file   Social Connections: Not on file   Intimate Partner Violence: Not on file   Housing Stability: Not on file     No Known Allergies    Objective:   BP 120/78 (Site: Right Upper Arm, Position: Sitting)    Pulse 93     Temp 97 ??F (36.1 ??C)    Ht 5\' 3"  (1.6 m)    Wt 138 lb 6.4 oz (62.8 kg)    SpO2 98%    BMI 24.52 kg/m??     Physical Exam  Vitals and nursing note reviewed.   Constitutional:       Appearance: Normal appearance. She is not ill-appearing.   Eyes:      General: Lids are normal.         Right eye: No discharge.         Left eye: No discharge.      Extraocular Movements: Extraocular movements intact.      Conjunctiva/sclera:      Right eye: No exudate.     Left eye: No exudate.     Pupils: Pupils are equal, round, and reactive to light.        Comments: Mild conjunctival erythema- generalized in left eye, minor near inner canthus of right, both without drainage, lid or periorbital edema   Pulmonary:      Effort: Pulmonary effort is normal.   Skin:     General: Skin is warm and dry.   Neurological:      Mental Status: She is alert and oriented to person, place, and time.   Psychiatric:         Attention and Perception: Attention and perception normal.         Mood and Affect: Affect normal. Mood is anxious.       No results found for: NA, K, CL, CO2, BUN, CREATININE, GLUCOSE, CALCIUM, PROT, LABALBU, BILITOT, ALKPHOS, AST, ALT, LABGLOM, GFRAA  No results found for: WBC, HGB, HCT, MCV, PLT  Lab Results   Component Value Date    CHOL 293 (A) 12/08/2020     Lab Results   Component Value Date    TRIG 131 12/08/2020     Lab Results   Component Value Date    HDL 103 (H) 12/08/2020     Lab Results   Component Value Date    LDLCHOLESTEROL 164 (A) 12/08/2020     No results found for: LABA1C  No results found for: TSH

## 2021-08-11 NOTE — Telephone Encounter (Signed)
S:   Patient calling [facility] persistent eye infection    B:    Onset 10/8    A:  Patient taking antibiotics since 10/9 without improvement and wants to be seen. Eye is painful  Patient states received call back from office when [facility] starting assessment.    R:    Patient PCP office called patient at start of assessment and made her a same day appointment today at 1140 today per patient. No further assessment.      Reason for Disposition   Caller has already spoken with the PCP (or office), and has no further questions    Protocols used: No Contact or Duplicate Contact Call-ADULT-[place]

## 2021-08-17 NOTE — Telephone Encounter (Signed)
From: Tammy Bruce  To: Dr. Jennette Kettle  Sent: 08/16/2021 12:44 PM EDT  Subject: Prescription request    I am requesting a prescription for anti anxiety. Please I am having a mammogram and ultrasound for my breast on Friday. I get all upset and my blood pressure becomes quite elevated. I???m the past I have used anti anxiety medication for minor procedures and it has helped me.

## 2021-08-19 ENCOUNTER — Other Ambulatory Visit (HOSPITAL_COMMUNITY)
Admission: RE | Admit: 2021-08-19 | Discharge: 2021-08-19 | Disposition: A | Discharge status: Home / Self Care | Payer: 59 | Source: Ambulatory Visit | Attending: Obstetrics & Gynecology | Admitting: Obstetrics & Gynecology

## 2021-08-19 ENCOUNTER — Ambulatory Visit (INDEPENDENT_AMBULATORY_CARE_PROVIDER_SITE_OTHER): Payer: 59 | Admitting: Obstetrics & Gynecology

## 2021-08-19 ENCOUNTER — Other Ambulatory Visit: Payer: Self-pay

## 2021-08-19 ENCOUNTER — Encounter (HOSPITAL_BASED_OUTPATIENT_CLINIC_OR_DEPARTMENT_OTHER): Payer: Self-pay | Admitting: Obstetrics & Gynecology

## 2021-08-19 VITALS — BP 112/62 | HR 78 | Ht 63.5 in | Wt 184.6 lb

## 2021-08-19 DIAGNOSIS — E559 Vitamin D deficiency, unspecified: Secondary | ICD-10-CM | POA: Diagnosis not present

## 2021-08-19 DIAGNOSIS — Z01419 Encounter for gynecological examination (general) (routine) without abnormal findings: Secondary | ICD-10-CM | POA: Diagnosis not present

## 2021-08-19 DIAGNOSIS — Z780 Asymptomatic menopausal state: Secondary | ICD-10-CM

## 2021-08-19 DIAGNOSIS — I10 Essential (primary) hypertension: Secondary | ICD-10-CM | POA: Diagnosis not present

## 2021-08-19 DIAGNOSIS — Z124 Encounter for screening for malignant neoplasm of cervix: Secondary | ICD-10-CM

## 2021-08-19 NOTE — Telephone Encounter (Signed)
Outgoing call to Pt. Pt informed of procedure to the best of knowledge. Pt concerns answered with the evaluation and wait time. Pt understood all information given and remined that her concerns are not being dismissed. Pt reminded that as soon as the procedure is released that the information will be conveyed. No other concerns voiced.

## 2021-08-19 NOTE — Progress Notes (Signed)
65 y.o. G55P2002 Divorced White or Caucasian female here for annual exam.  Denies vaginal bleeding.  Patient's last menstrual period was 11/01/2003 (approximate).          Sexually active: No.  The current method of family planning is post menopausal status.    Exercising: No.   Smoker:  no  Health Maintenance: Pap:  12/28/2018 Negative History of abnormal Pap:  no MMG:  01/12/2021 Negative Colonoscopy:  12/28/2017, follow up 10 years BMD:   09/03/2020. -1.7 Screening Labs: Dr. Sabra Heck   reports that she quit smoking about 22 years ago. Her smoking use included cigarettes. She has never used smokeless tobacco. She reports current alcohol use of about 2.0 standard drinks per week. She reports that she does not use drugs.  Past Medical History:  Diagnosis Date   Allergy    Anxiety    Aortic atherosclerosis (HCC)    Arthritis    Asthma    Depression    Eczema    Fatty liver    GERD (gastroesophageal reflux disease)    Glaucoma    Heart murmur    Hyperlipidemia    Hypertension    Hypertensive retinopathy    Hypothyroidism    Insomnia    Kidney problem    Obesity    Osteoarthritis    Osteopenia    Prediabetes    Rosacea    Thyroid disease    Vitamin D deficiency    Vocal cord nodules     Past Surgical History:  Procedure Laterality Date   CESAREAN SECTION     x 1   MICROLARYNGOSCOPY Left 11/06/2017   Procedure: MICRO DIRECT LARYNGOSCOPY REMOVAL OF VOCAL CORD;  Surgeon: Jodi Marble, MD;  Location: [facility];  Service: ENT;  Laterality: Left;   THYROIDECTOMY, PARTIAL  1975   due to [place] disease   TOTAL KNEE ARTHROPLASTY Left 03/09/2020   Procedure: TOTAL KNEE ARTHROPLASTY;  Surgeon: Gaynelle Arabian, MD;  Location: [facility];  Service: Orthopedics;  Laterality: Left;    Current Outpatient Medications  Medication Sig Dispense Refill   acetaminophen (TYLENOL) 500 MG tablet Take 500 mg by mouth every 6 (six) hours as needed for moderate pain.       albuterol (VENTOLIN HFA) 108 (90 Base) MCG/ACT inhaler INHALE 2 PUFFS BY MOUTH AS NEEDED 18 g 2   ALPRAZolam (XANAX) 0.5 MG tablet Take 0.5-1 tablet (0.25-0.5 mg total) by mouth at bedtime as needed for sleep 30 tablet 0   amoxicillin (AMOXIL) 500 MG capsule Take 4 capsules by mouth 1 hour prior to dental treatment 8 capsule 0   cetirizine (ZYRTEC) 10 MG tablet Take 10 mg by mouth daily. At bedtime     Cholecalciferol (VITAMIN D3) 1.25 MG (50000 UT) CAPS SMARTSIG:By Mouth     COVID-19 mRNA Vac-TriS, Pfizer, (PFIZER-BIONT COVID-19 VAC-TRIS) SUSP injection Inject into the muscle. 0.3 mL 0   famotidine (PEPCID) 20 MG tablet TAKE 1 TABLET BY MOUTH TWICE A DAY 180 tablet 3   hydrochlorothiazide (MICROZIDE) 12.5 MG capsule Take 1 capsule (12.5 mg total) by mouth daily. 90 capsule 3   influenza vac split quadrivalent PF (FLUARIX) 0.5 ML injection Inject into the muscle. 0.5 mL 0   levothyroxine (SYNTHROID) 100 MCG tablet Take 1 tablet (100 mcg total) by mouth daily. 90 tablet 0   levothyroxine (SYNTHROID) 88 MCG tablet TAKE 1 TABLET IN THE MORNING ON AN EMPTY STOMACH BY MOUTH FOUR TIMES A WEEK 90 tablet 3   levothyroxine (SYNTHROID) 88  MCG tablet Take 1 tablet (88 mcg total) by mouth daily before breakfast. 90 tablet 0   losartan (COZAAR) 50 MG tablet TAKE 1 TABLET BY MOUTH ONCE A DAY 90 tablet 3   olopatadine (PATANOL) 0.1 % ophthalmic solution olopatadine 0.1 % eye drops  INSTILL 1 DROP BOTH EYES TWICE A DAY     simvastatin (ZOCOR) 40 MG tablet TAKE 1 TABLET BY MOUTH EVERY EVENING ONCE A DAY 90 tablet 3   tirzepatide (MOUNJARO) 12.5 MG/0.5ML Pen Inject 12.5 mg into the skin once a week. 2 mL 0   No current facility-administered medications for this visit.    Family History  Problem Relation Age of Onset   Hypertension Mother    Hyperlipidemia Mother    Diabetes Mother    Thyroid disease Mother    Hypertension Father    Heart disease Father        CHF   Colon cancer Father 72       stage 4    Kidney disease Father    Sleep apnea Father    Obesity Father    Thyroid disease Daughter    Esophageal cancer Neg Hx    Rectal cancer Neg Hx    Liver cancer Neg Hx    Pancreatic cancer Neg Hx    Stomach cancer Neg Hx     Review of Systems  All other systems reviewed and are negative.  Exam:   BP 112/62 (BP Location: Left Arm, Patient Position: Sitting, Cuff Size: Large)   Pulse 78   Ht 5' 3.5" (1.613 m)   Wt 184 lb 9.6 oz (83.7 kg)   LMP 11/01/2003 (Approximate)   BMI 32.19 kg/m   Height: 5' 3.5" (161.3 cm)  General appearance: alert, cooperative and appears stated age Head: Normocephalic, without obvious abnormality, atraumatic Neck: no adenopathy, supple, symmetrical, trachea midline and thyroid normal to inspection and palpation Lungs: clear to auscultation bilaterally Breasts: normal appearance, no masses or tenderness Heart: regular rate and rhythm Abdomen: soft, non-tender; bowel sounds normal; no masses,  no organomegaly Extremities: extremities normal, atraumatic, no cyanosis or edema Skin: Skin color, texture, turgor normal. No rashes or lesions Lymph nodes: Cervical, supraclavicular, and axillary nodes normal. No abnormal inguinal nodes palpated Neurologic: Grossly normal   Pelvic: External genitalia:  no lesions              Urethra:  normal appearing urethra with no masses, tenderness or lesions              Bartholins and Skenes: normal                 Vagina: normal appearing vagina with normal color and no discharge, no lesions              Cervix: no lesions              Pap taken: Yes.   Bimanual Exam:  Uterus:  normal size, contour, position, consistency, mobility, non-tender              Adnexa: normal adnexa and no mass, fullness, tenderness               Rectovaginal: Confirms               Anus:  normal sphincter tone, no lesions  Chaperone, Octaviano Batty, CMA, was present for exam.  Assessment/Plan: 1. Well woman exam with routine  gynecological exam - pap smear obtained today - MMG 12/2020 - Colonoscopy 12/2017, follow  up 10 years - BMD 08/2020 - lab work done with Dr. Migdalia Dk - care gaps reviewed/updated  2. Postmenopausal - not on HRT  3. Primary hypertension  4. Vitamin D deficiency

## 2021-08-20 ENCOUNTER — Inpatient Hospital Stay: Admit: 2021-08-20 | Attending: Diagnostic Radiology | Primary: Family Medicine

## 2021-08-20 ENCOUNTER — Ambulatory Visit: Primary: Family Medicine

## 2021-08-20 DIAGNOSIS — N644 Mastodynia: Secondary | ICD-10-CM

## 2021-08-20 LAB — CYTOLOGY - PAP
Comment: NEGATIVE
Diagnosis: NEGATIVE
High risk HPV: NEGATIVE

## 2021-08-20 NOTE — Other (Unsigned)
Patient Acct Nbr: [id number]   Primary AUTH/CERT:   Primary Insurance Company Name: [employer]  Primary Insurance Plan name: [facility]  Primary Insurance Group Number:   Primary Insurance Plan Type: Health  Primary Insurance Policy Number: [id number]

## 2021-08-21 DIAGNOSIS — Z780 Asymptomatic menopausal state: Secondary | ICD-10-CM | POA: Insufficient documentation

## 2021-08-21 DIAGNOSIS — E559 Vitamin D deficiency, unspecified: Secondary | ICD-10-CM | POA: Insufficient documentation

## 2021-08-23 LAB — FECAL DNA COLORECTAL CANCER SCREENING (COLOGUARD): FIT-DNA (Cologuard): NEGATIVE

## 2021-08-25 MED ORDER — FAMOTIDINE 20 MG PO TABS
20 MG | ORAL_TABLET | ORAL | 3 refills | Status: AC
Start: 2021-08-25 — End: ?

## 2021-08-25 NOTE — Telephone Encounter (Signed)
Verified pharmacy: Yes  Verified day(s) supplied: Yes  Medication:      Requested Prescriptions     Pending Prescriptions Disp Refills    famotidine (PEPCID) 20 MG tablet [Pharmacy Med Name: FAMOTIDINE 20 MG TABLET] 60 tablet 3     Sig: TAKE 1 TABLET BY MOUTH TWICE A DAY      Last  appointment 08/11/2021,  Next appointment is Visit date not found  Medication Contract and Consent for Opioid Use Documents Filed        No documents found

## 2021-08-26 ENCOUNTER — Other Ambulatory Visit (HOSPITAL_COMMUNITY): Payer: Self-pay

## 2021-09-02 ENCOUNTER — Encounter (INDEPENDENT_AMBULATORY_CARE_PROVIDER_SITE_OTHER): Payer: Self-pay | Admitting: Family Medicine

## 2021-09-02 ENCOUNTER — Other Ambulatory Visit (HOSPITAL_COMMUNITY): Payer: Self-pay

## 2021-09-02 ENCOUNTER — Ambulatory Visit (INDEPENDENT_AMBULATORY_CARE_PROVIDER_SITE_OTHER): Payer: 59 | Admitting: Family Medicine

## 2021-09-02 ENCOUNTER — Other Ambulatory Visit: Payer: Self-pay

## 2021-09-02 VITALS — BP 109/69 | HR 67 | Temp 97.9°F | Ht 63.0 in | Wt 179.0 lb

## 2021-09-02 DIAGNOSIS — E669 Obesity, unspecified: Secondary | ICD-10-CM

## 2021-09-02 DIAGNOSIS — E559 Vitamin D deficiency, unspecified: Secondary | ICD-10-CM | POA: Diagnosis not present

## 2021-09-02 DIAGNOSIS — R7301 Impaired fasting glucose: Secondary | ICD-10-CM

## 2021-09-02 DIAGNOSIS — E039 Hypothyroidism, unspecified: Secondary | ICD-10-CM | POA: Diagnosis not present

## 2021-09-02 DIAGNOSIS — Z6831 Body mass index (BMI) 31.0-31.9, adult: Secondary | ICD-10-CM | POA: Diagnosis not present

## 2021-09-02 DIAGNOSIS — E66811 Obesity, class 1: Secondary | ICD-10-CM

## 2021-09-02 MED ORDER — TIRZEPATIDE 15 MG/0.5ML ~~LOC~~ SOAJ
15.0000 mg | SUBCUTANEOUS | 0 refills | Status: DC
Start: 2021-09-02 — End: 2021-10-14
  Filled 2021-09-02: qty 6, 84d supply, fill #0

## 2021-09-06 NOTE — Progress Notes (Signed)
Chief Complaint:   OBESITY Morgan Williams is here to discuss her progress with her obesity treatment plan along with follow-up of her obesity related diagnoses. See Medical Weight Management Flowsheet for complete bioelectrical impedance results.  Today's visit was #: 59 Starting weight: 219 lbs Starting date: 09/20/2018 Weight change since last visit: 2 lbs Total lbs lost to date: 40 lbs Total weight loss percentage to date: -18.26%  Nutrition Plan: Category 2 Plan with 400-500 calories and 35 grams of protein at supper for 60% of the time. Activity: Walking for 45 minutes 3-4 times per week.  Anti-obesity medications: Mounjaro 12.5 mg subcutaneously weekly. Reported side effects: None.  Interim History: [name] is tolerating Mounjaro well.  She reports that she is still having polyphagia.  Assessment/Plan:   1. Impaired fasting glucose, with polyphagia Not at goal. Current treatment: Mounjaro 12.5 mg subcutaneously weekly.    Plan:  Increase Mounjaro to 15 mg subcutaneously weekly.  Refill provided today, as per below.  She will continue to focus on protein-rich, low simple carbohydrate foods. We reviewed the importance of hydration, regular exercise for stress reduction, and restorative sleep.  - Increase and refill tirzepatide (MOUNJARO) 15 MG/0.5ML Pen; Inject 15 mg into the skin once a week.  Dispense: 6 mL; Refill: 0  2. Hypothyroidism, unspecified type Course: Stable. Medication: levothyroxine 100 mcg three times per week and 88 mcg four times per week.  TSH 0.959 on 07/27/2021.  Plan: Patient was instructed not to take MVM or iron within 4 hours of taking thyroid medications. This issue is managed by Dr. Sabra Heck. We will continue to monitor alongside Endocrinology/PCP as it relates to her weight loss journey.   Lab Results  Component Value Date   TSH 0.959 07/27/2021   3. Vitamin D deficiency Above goal.  She is taking OTC vitamin D 4,000 IU daily.  Plan: Decrease vitamin D  from 4,000 IU daily to 2,000 IU daily.  Follow-up for routine testing of Vitamin D, at least 2-3 times per year to avoid over-replacement.  Lab Results  Component Value Date   VD25OH 77.6 07/27/2021   VD25OH 66.2 12/24/2020   VD25OH 72.1 07/28/2020   4. Obesity, current BMI 31.8  Course: [name] is currently in the action stage of change. As such, her goal is to continue with weight loss efforts.   Nutrition goals: She has agreed to the Category 2 Plan and keeping a food journal and adhering to recommended goals of 400-500 calories and 35 grams of protein at supper.   Exercise goals:  As is.  Behavioral modification strategies: increasing lean protein intake, decreasing simple carbohydrates, and increasing vegetables.  [name] has agreed to follow-up with our clinic in 4-6 weeks. She was informed of the importance of frequent follow-up visits to maximize her success with intensive lifestyle modifications for her multiple health conditions.   Objective:   Blood pressure 109/69, pulse 67, temperature 97.9 F (36.6 C), temperature source Oral, height 5\' 3"  (1.6 m), weight 179 lb (81.2 kg), last menstrual period 11/01/2003, SpO2 99 %. Body mass index is 31.71 kg/m.  General: Cooperative, alert, well developed, in no acute distress. HEENT: Conjunctivae and lids unremarkable. Cardiovascular: Regular rhythm.  Lungs: Normal work of breathing. Neurologic: No focal deficits.   Lab Results  Component Value Date   CREATININE 0.82 07/27/2021   BUN 20 07/27/2021   NA 140 07/27/2021   K 3.8 07/27/2021   CL 102 07/27/2021   CO2 22 07/27/2021   Lab Results  Component Value Date   ALT 13 07/27/2021   AST 16 07/27/2021   ALKPHOS 78 07/27/2021   BILITOT 0.5 07/27/2021   Lab Results  Component Value Date   HGBA1C 5.6 07/27/2021   HGBA1C 5.5 12/24/2020   HGBA1C 5.8 (H) 07/28/2020   HGBA1C 5.7 (H) 02/28/2020   HGBA1C 5.6 09/05/2019   Lab Results  Component Value Date   INSULIN 13.5  07/27/2021   INSULIN 15.8 12/24/2020   INSULIN 12.7 07/28/2020   INSULIN 8.3 09/05/2019   INSULIN 11.3 05/02/2019   Lab Results  Component Value Date   TSH 0.959 07/27/2021   Lab Results  Component Value Date   CHOL 129 07/27/2021   HDL 42 07/27/2021   LDLCALC 68 07/27/2021   TRIG 102 07/27/2021   CHOLHDL 3.1 07/27/2021   Lab Results  Component Value Date   VD25OH 77.6 07/27/2021   VD25OH 66.2 12/24/2020   VD25OH 72.1 07/28/2020   Lab Results  Component Value Date   WBC 7.3 07/27/2021   HGB 12.6 07/27/2021   HCT 37.2 07/27/2021   MCV 91 07/27/2021   PLT 195 07/27/2021   Attestation Statements:   Reviewed by clinician on day of visit: allergies, medications, problem list, medical history, surgical history, family history, social history, and previous encounter notes.  I, Water quality scientist, CMA, am acting as transcriptionist for Briscoe Deutscher, DO  I have reviewed the above documentation for accuracy and completeness, and I agree with the above. -  Briscoe Deutscher, DO, MS, FAAFP, DABOM - Family and Bariatric Medicine.

## 2021-09-07 ENCOUNTER — Ambulatory Visit: Payer: 59 | Attending: Internal Medicine

## 2021-09-07 ENCOUNTER — Other Ambulatory Visit (HOSPITAL_BASED_OUTPATIENT_CLINIC_OR_DEPARTMENT_OTHER): Payer: Self-pay

## 2021-09-07 ENCOUNTER — Other Ambulatory Visit (HOSPITAL_COMMUNITY): Payer: Self-pay

## 2021-09-07 DIAGNOSIS — Z23 Encounter for immunization: Secondary | ICD-10-CM

## 2021-09-07 MED ORDER — PFIZER COVID-19 VAC BIVALENT 30 MCG/0.3ML IM SUSP
INTRAMUSCULAR | 0 refills | Status: DC
  Filled 2021-09-07: qty 0.3, 1d supply, fill #0

## 2021-09-07 NOTE — Progress Notes (Signed)
   Covid-19 Vaccination Clinic  Name:  Avah Bashor    MRN: [id number] DOB: [birth year]  09/07/2021  Ms. Wilmouth was observed post Covid-19 immunization for 15 minutes without incident. She was provided with Vaccine Information Sheet and instruction to access the V-Safe system.   Ms. Fellows was instructed to call 911 with any severe reactions post vaccine: Difficulty breathing  Swelling of face and throat  A fast heartbeat  A bad rash all over body  Dizziness and weakness   Immunizations Administered     Name Date Dose VIS Date Route   Pfizer Covid-19 Vaccine Bivalent Booster 09/07/2021  9:30 AM 0.3 mL 06/30/2021 Intramuscular   Manufacturer: [employer]   Lot: [id number]   [place]: [phone]

## 2021-09-08 ENCOUNTER — Other Ambulatory Visit (HOSPITAL_COMMUNITY): Payer: Self-pay

## 2021-09-13 ENCOUNTER — Encounter: Payer: MEDICAID | Attending: Family Medicine | Primary: Family Medicine

## 2021-10-05 ENCOUNTER — Other Ambulatory Visit (HOSPITAL_COMMUNITY): Payer: Self-pay

## 2021-10-13 ENCOUNTER — Other Ambulatory Visit (HOSPITAL_COMMUNITY): Payer: Self-pay

## 2021-10-13 MED ORDER — METRONIDAZOLE 0.75 % EX LOTN
TOPICAL_LOTION | CUTANEOUS | 0 refills | Status: DC
  Filled 2021-10-13: qty 177, 90d supply, fill #0

## 2021-10-13 MED FILL — Simvastatin Tab 40 MG: ORAL | 90 days supply | Qty: 90 | Fill #2 | Status: AC

## 2021-10-13 MED FILL — Albuterol Sulfate Inhal Aero 108 MCG/ACT (90MCG Base Equiv): RESPIRATORY_TRACT | 25 days supply | Qty: 18 | Fill #0 | Status: AC

## 2021-10-13 MED FILL — Hydrochlorothiazide Cap 12.5 MG: ORAL | 90 days supply | Qty: 90 | Fill #2 | Status: AC

## 2021-10-13 MED FILL — Losartan Potassium Tab 50 MG: ORAL | 90 days supply | Qty: 90 | Fill #2 | Status: AC

## 2021-10-13 MED FILL — Famotidine Tab 20 MG: ORAL | 90 days supply | Qty: 180 | Fill #2 | Status: AC

## 2021-10-14 ENCOUNTER — Other Ambulatory Visit (HOSPITAL_COMMUNITY): Payer: Self-pay

## 2021-10-14 ENCOUNTER — Other Ambulatory Visit: Payer: Self-pay

## 2021-10-14 ENCOUNTER — Encounter (INDEPENDENT_AMBULATORY_CARE_PROVIDER_SITE_OTHER): Payer: Self-pay | Admitting: Family Medicine

## 2021-10-14 ENCOUNTER — Ambulatory Visit (INDEPENDENT_AMBULATORY_CARE_PROVIDER_SITE_OTHER): Payer: 59 | Admitting: Family Medicine

## 2021-10-14 VITALS — BP 109/71 | HR 77 | Temp 97.8°F | Ht 63.0 in | Wt 175.0 lb

## 2021-10-14 DIAGNOSIS — E669 Obesity, unspecified: Secondary | ICD-10-CM

## 2021-10-14 DIAGNOSIS — R7303 Prediabetes: Secondary | ICD-10-CM | POA: Diagnosis not present

## 2021-10-14 DIAGNOSIS — I10 Essential (primary) hypertension: Secondary | ICD-10-CM

## 2021-10-14 DIAGNOSIS — E038 Other specified hypothyroidism: Secondary | ICD-10-CM | POA: Diagnosis not present

## 2021-10-14 DIAGNOSIS — Z6831 Body mass index (BMI) 31.0-31.9, adult: Secondary | ICD-10-CM | POA: Diagnosis not present

## 2021-10-14 MED ORDER — ALPRAZOLAM 0.5 MG PO TABS
0.2500 mg | ORAL_TABLET | Freq: Every evening | ORAL | 0 refills | Status: DC | PRN
  Filled 2021-10-14: qty 30, 30d supply, fill #0

## 2021-10-14 MED ORDER — TIRZEPATIDE 12.5 MG/0.5ML ~~LOC~~ SOAJ
12.5000 mg | SUBCUTANEOUS | 0 refills | Status: DC
Start: 2021-10-14 — End: 2021-11-30
  Filled 2021-10-14: qty 6, 84d supply, fill #0

## 2021-10-14 MED ORDER — TIRZEPATIDE 15 MG/0.5ML ~~LOC~~ SOAJ
15.0000 mg | SUBCUTANEOUS | 0 refills | Status: DC
  Filled 2021-10-14 – 2021-12-07 (×3): qty 6, 84d supply, fill #0

## 2021-10-15 ENCOUNTER — Other Ambulatory Visit (HOSPITAL_COMMUNITY): Payer: Self-pay

## 2021-10-27 ENCOUNTER — Other Ambulatory Visit (HOSPITAL_COMMUNITY): Payer: Self-pay

## 2021-10-27 MED FILL — Levothyroxine Sodium Tab 88 MCG: ORAL | 84 days supply | Qty: 48 | Fill #0 | Status: AC

## 2021-10-28 NOTE — Progress Notes (Signed)
Chief Complaint:   OBESITY Morgan Williams is here to discuss her progress with her obesity treatment plan along with follow-up of her obesity related diagnoses. See Medical Weight Management Flowsheet for complete bioelectrical impedance results.  Today's visit was #: 71 Starting weight: 219 lbs Starting date: 09/20/2018 Weight change since last visit: 4 lbs Total lbs lost to date: 44 lbs Total weight loss percentage to date: 20.09%  Nutrition Plan: Category 2 Plan and keeping a food journal and adhering to recommended goals of 400-500 calories and 35 grams of protein at supper for 60% of the time. Activity: Walking a few miles 3-4 times per week. Anti-obesity medications: Mounjaro 15 mg subcutaneously weekly. Reported side effects: None.  Interim History: Morgan Williams says she is happy with her medications and progress.  Assessment/Plan:   1. Prediabetes, with polyphagia At goal. Goal is HgbA1c < 5.7.  Medication: Mounjaro 15 mg subcutaneously weekly.    Plan: Continue Mounjaro at current dose.  Refill provided today.  She will continue to focus on protein-rich, low simple carbohydrate foods. We reviewed the importance of hydration, regular exercise for stress reduction, and restorative sleep.   Lab Results  Component Value Date   HGBA1C 5.6 07/27/2021   Lab Results  Component Value Date   INSULIN 13.5 07/27/2021   INSULIN 15.8 12/24/2020   INSULIN 12.7 07/28/2020   INSULIN 8.3 09/05/2019   INSULIN 11.3 05/02/2019   - Refill tirzepatide (MOUNJARO) 15 MG/0.5ML Pen; Inject 15 mg into the skin once a week.  Dispense: 6 mL; Refill: 0 - Refill tirzepatide (MOUNJARO) 12.5 MG/0.5ML Pen; Inject 12.5 mg into the skin once a week.  Dispense: 6 mL; Refill: 0  2. Essential hypertension At goal. Medications: HCTZ 12.5 mg daily, losartan 50 mg daily.   Plan: Avoid buying foods that are: processed, frozen, or prepackaged to avoid excess salt. We will watch for signs of hypotension as she  continues lifestyle modifications.  BP Readings from Last 3 Encounters:  10/14/21 109/71  09/02/21 109/69  08/19/21 112/62   Lab Results  Component Value Date   CREATININE 0.82 07/27/2021   3. Other specified hypothyroidism Course: Stable. Medication: levothyroxine 100 mcg three times per week and 88 mcg four times per week..   Plan: Patient was instructed not to take MVM or iron within 4 hours of taking thyroid medications.  We will continue to monitor alongside Endocrinology/PCP as it relates to her weight loss journey.   Lab Results  Component Value Date   TSH 0.959 07/27/2021   4. Obesity, current BMI 31.1  Course: Morgan Williams is currently in the action stage of change. As such, her goal is to continue with weight loss efforts.   Nutrition goals: She has agreed to the Category 2 Plan and keeping a food journal and adhering to recommended goals of 400-500 calories and 35 grams of protein at supper for 60% of the time..   Exercise goals:  As is.  Behavioral modification strategies: increasing lean protein intake, decreasing simple carbohydrates, increasing vegetables, and increasing water intake.  [name] has agreed to follow-up with our clinic in 4 weeks. She was informed of the importance of frequent follow-up visits to maximize her success with intensive lifestyle modifications for her multiple health conditions.   Objective:   Blood pressure 109/71, pulse 77, temperature 97.8 F (36.6 C), temperature source Oral, height 5\' 3"  (1.6 m), weight 175 lb (79.4 kg), last menstrual period 11/01/2003, SpO2 97 %. Body mass index is 31 kg/m.  General: Cooperative, alert, well developed, in no acute distress. HEENT: Conjunctivae and lids unremarkable. Cardiovascular: Regular rhythm.  Lungs: Normal work of breathing. Neurologic: No focal deficits.   Lab Results  Component Value Date   CREATININE 0.82 07/27/2021   BUN 20 07/27/2021   NA 140 07/27/2021   K 3.8 07/27/2021   CL 102  07/27/2021   CO2 22 07/27/2021   Lab Results  Component Value Date   ALT 13 07/27/2021   AST 16 07/27/2021   ALKPHOS 78 07/27/2021   BILITOT 0.5 07/27/2021   Lab Results  Component Value Date   HGBA1C 5.6 07/27/2021   HGBA1C 5.5 12/24/2020   HGBA1C 5.8 (H) 07/28/2020   HGBA1C 5.7 (H) 02/28/2020   HGBA1C 5.6 09/05/2019   Lab Results  Component Value Date   INSULIN 13.5 07/27/2021   INSULIN 15.8 12/24/2020   INSULIN 12.7 07/28/2020   INSULIN 8.3 09/05/2019   INSULIN 11.3 05/02/2019   Lab Results  Component Value Date   TSH 0.959 07/27/2021   Lab Results  Component Value Date   CHOL 129 07/27/2021   HDL 42 07/27/2021   LDLCALC 68 07/27/2021   TRIG 102 07/27/2021   CHOLHDL 3.1 07/27/2021   Lab Results  Component Value Date   VD25OH 77.6 07/27/2021   VD25OH 66.2 12/24/2020   VD25OH 72.1 07/28/2020   Lab Results  Component Value Date   WBC 7.3 07/27/2021   HGB 12.6 07/27/2021   HCT 37.2 07/27/2021   MCV 91 07/27/2021   PLT 195 07/27/2021   Attestation Statements:   Reviewed by clinician on day of visit: allergies, medications, problem list, medical history, surgical history, family history, social history, and previous encounter notes.  I, Water quality scientist, CMA, am acting as transcriptionist for Briscoe Deutscher, DO  I have reviewed the above documentation for accuracy and completeness, and I agree with the above. -  Briscoe Deutscher, DO, MS, FAAFP, DABOM - Family and Bariatric Medicine.

## 2021-11-18 ENCOUNTER — Other Ambulatory Visit (HOSPITAL_COMMUNITY): Payer: Self-pay

## 2021-11-24 ENCOUNTER — Other Ambulatory Visit (HOSPITAL_COMMUNITY): Payer: Self-pay

## 2021-11-29 ENCOUNTER — Ambulatory Visit (INDEPENDENT_AMBULATORY_CARE_PROVIDER_SITE_OTHER): Payer: 59 | Admitting: Family Medicine

## 2021-11-29 ENCOUNTER — Encounter (INDEPENDENT_AMBULATORY_CARE_PROVIDER_SITE_OTHER): Payer: Self-pay | Admitting: Family Medicine

## 2021-11-29 ENCOUNTER — Other Ambulatory Visit: Payer: Self-pay

## 2021-11-29 VITALS — BP 101/65 | HR 70 | Temp 98.3°F | Ht 63.0 in | Wt 174.0 lb

## 2021-11-29 DIAGNOSIS — Z6838 Body mass index (BMI) 38.0-38.9, adult: Secondary | ICD-10-CM | POA: Diagnosis not present

## 2021-11-29 DIAGNOSIS — G4709 Other insomnia: Secondary | ICD-10-CM | POA: Diagnosis not present

## 2021-11-29 DIAGNOSIS — R7303 Prediabetes: Secondary | ICD-10-CM

## 2021-11-29 DIAGNOSIS — E669 Obesity, unspecified: Secondary | ICD-10-CM

## 2021-11-29 DIAGNOSIS — I10 Essential (primary) hypertension: Secondary | ICD-10-CM | POA: Diagnosis not present

## 2021-11-30 ENCOUNTER — Other Ambulatory Visit: Payer: Self-pay | Admitting: Obstetrics & Gynecology

## 2021-11-30 ENCOUNTER — Other Ambulatory Visit (HOSPITAL_COMMUNITY): Payer: Self-pay

## 2021-11-30 DIAGNOSIS — Z1231 Encounter for screening mammogram for malignant neoplasm of breast: Secondary | ICD-10-CM

## 2021-11-30 MED ORDER — LOSARTAN POTASSIUM 50 MG PO TABS: ORAL_TABLET | ORAL | 3 refills | Status: DC

## 2021-11-30 MED ORDER — TRAZODONE HCL 50 MG PO TABS
50.0000 mg | ORAL_TABLET | Freq: Every day | ORAL | 1 refills | Status: DC
  Filled 2021-11-30: qty 90, 90d supply, fill #0

## 2021-12-01 ENCOUNTER — Other Ambulatory Visit (HOSPITAL_COMMUNITY): Payer: Self-pay

## 2021-12-06 NOTE — Progress Notes (Signed)
Chief Complaint:   OBESITY Morgan Williams is here to discuss her progress with her obesity treatment plan along with follow-up of her obesity related diagnoses. See Medical Weight Management Flowsheet for complete bioelectrical impedance results.  Today's visit was #: 58 Starting weight: 219 lbs Starting date: 09/20/2018 Weight change since last visit: 1 lb Total lbs lost to date: 45 lbs Total weight loss percentage to date: -20.55%  Nutrition Plan: Category 2 Plan with keeping a food journal and adhering to recommended goals of 400-500 calories and 35 grams of protein at supper daily for 25% of the time. Activity: Walking for 45 minutes 3-4 times per week.   Interim History: Morgan Williams says she overindulged over the holidays.  Assessment/Plan:   1. Essential hypertension Low. Medications: losartan 50 mg daily, HCTZ 12.5 mg daily.   Plan:  Decrease losartan by half. Avoid buying foods that are: processed, frozen, or prepackaged to avoid excess salt. We will watch for signs of hypotension as she continues lifestyle modifications.  BP Readings from Last 3 Encounters:  11/29/21 101/65  10/14/21 109/71  09/02/21 109/69   Lab Results  Component Value Date   CREATININE 0.82 07/27/2021   - Decrease losartan (COZAAR) 50 MG tablet; Take 1/2 tablet by mouth once daily.  Dispense: 30 tablet; Refill: 3  2. Prediabetes, with polyphagia At goal. Goal is HgbA1c < 5.7.  Medication: Mounjaro 15 mg subcutaneously weekly.    Plan:  Continue Mounjaro 15 mg subcutaneously weekly. She will continue to focus on protein-rich, low simple carbohydrate foods. We reviewed the importance of hydration, regular exercise for stress reduction, and restorative sleep.   Lab Results  Component Value Date   HGBA1C 5.6 07/27/2021   Lab Results  Component Value Date   INSULIN 13.5 07/27/2021   INSULIN 15.8 12/24/2020   INSULIN 12.7 07/28/2020   INSULIN 8.3 09/05/2019   INSULIN 11.3 05/02/2019   3. Other  insomnia This is poorly controlled. Dysfunction:  Difficulty staying asleep .  Sleeps from 11-6:30, but wakes between 1-2 and 4:30.  Current treatment: Takes Xanax as needed.  Plan: Start trazodone 50 mg at bedtime. Recommend sleep hygiene measures including regular sleep schedule, optimal sleep environment, and relaxing presleep rituals.   - Start traZODone (DESYREL) 50 MG tablet; Take 1 tablet (50 mg total) by mouth at bedtime.  Dispense: 90 tablet; Refill: 1  4. Obesity with current BMI of 38.79  Course: Morgan Williams is currently in the action stage of change. As such, her goal is to continue with weight loss efforts.   Nutrition goals: She has agreed to the Category 2 Plan and keeping a food journal and adhering to recommended goals of 400-500 calories and 35 grams of protein at supper.   Exercise goals:  As is.  Behavioral modification strategies: increasing lean protein intake, decreasing simple carbohydrates, increasing vegetables, and increasing water intake.  Morgan Williams has agreed to follow-up with our clinic in 4 weeks. She was informed of the importance of frequent follow-up visits to maximize her success with intensive lifestyle modifications for her multiple health conditions.   Objective:   Blood pressure 101/65, pulse 70, temperature 98.3 F (36.8 C), height 5\' 3"  (1.6 m), weight 174 lb (78.9 kg), last menstrual period 11/01/2003, SpO2 99 %. Body mass index is 30.82 kg/m.  General: Cooperative, alert, well developed, in no acute distress. HEENT: Conjunctivae and lids unremarkable. Cardiovascular: Regular rhythm.  Lungs: Normal work of breathing. Neurologic: No focal deficits.   Lab Results  Component  Value Date   CREATININE 0.82 07/27/2021   BUN 20 07/27/2021   NA 140 07/27/2021   K 3.8 07/27/2021   CL 102 07/27/2021   CO2 22 07/27/2021   Lab Results  Component Value Date   ALT 13 07/27/2021   AST 16 07/27/2021   ALKPHOS 78 07/27/2021   BILITOT 0.5 07/27/2021   Lab  Results  Component Value Date   HGBA1C 5.6 07/27/2021   HGBA1C 5.5 12/24/2020   HGBA1C 5.8 (H) 07/28/2020   HGBA1C 5.7 (H) 02/28/2020   HGBA1C 5.6 09/05/2019   Lab Results  Component Value Date   INSULIN 13.5 07/27/2021   INSULIN 15.8 12/24/2020   INSULIN 12.7 07/28/2020   INSULIN 8.3 09/05/2019   INSULIN 11.3 05/02/2019   Lab Results  Component Value Date   TSH 0.959 07/27/2021   Lab Results  Component Value Date   CHOL 129 07/27/2021   HDL 42 07/27/2021   LDLCALC 68 07/27/2021   TRIG 102 07/27/2021   CHOLHDL 3.1 07/27/2021   Lab Results  Component Value Date   VD25OH 77.6 07/27/2021   VD25OH 66.2 12/24/2020   VD25OH 72.1 07/28/2020   Lab Results  Component Value Date   WBC 7.3 07/27/2021   HGB 12.6 07/27/2021   HCT 37.2 07/27/2021   MCV 91 07/27/2021   PLT 195 07/27/2021   Attestation Statements:   Reviewed by clinician on day of visit: allergies, medications, problem list, medical history, surgical history, family history, social history, and previous encounter notes.  I, Water quality scientist, CMA, am acting as transcriptionist for Briscoe Deutscher, DO  I have reviewed the above documentation for accuracy and completeness, and I agree with the above. -  Briscoe Deutscher, DO, MS, FAAFP, DABOM - Family and Bariatric Medicine.

## 2021-12-07 ENCOUNTER — Other Ambulatory Visit (HOSPITAL_COMMUNITY): Payer: Self-pay

## 2021-12-28 DIAGNOSIS — H04123 Dry eye syndrome of bilateral lacrimal glands: Secondary | ICD-10-CM | POA: Diagnosis not present

## 2021-12-28 DIAGNOSIS — H40023 Open angle with borderline findings, high risk, bilateral: Secondary | ICD-10-CM | POA: Diagnosis not present

## 2021-12-30 ENCOUNTER — Other Ambulatory Visit (HOSPITAL_COMMUNITY): Payer: Self-pay

## 2021-12-30 MED ORDER — CARESTART COVID-19 HOME TEST VI KIT
PACK | 0 refills | Status: DC
  Filled 2021-12-30: qty 4, 4d supply, fill #0

## 2022-01-06 DIAGNOSIS — L821 Other seborrheic keratosis: Secondary | ICD-10-CM | POA: Diagnosis not present

## 2022-01-06 DIAGNOSIS — L812 Freckles: Secondary | ICD-10-CM | POA: Diagnosis not present

## 2022-01-06 DIAGNOSIS — D2261 Melanocytic nevi of right upper limb, including shoulder: Secondary | ICD-10-CM | POA: Diagnosis not present

## 2022-01-06 DIAGNOSIS — L820 Inflamed seborrheic keratosis: Secondary | ICD-10-CM | POA: Diagnosis not present

## 2022-01-06 DIAGNOSIS — I788 Other diseases of capillaries: Secondary | ICD-10-CM | POA: Diagnosis not present

## 2022-01-11 ENCOUNTER — Encounter (INDEPENDENT_AMBULATORY_CARE_PROVIDER_SITE_OTHER): Payer: Self-pay | Admitting: Family Medicine

## 2022-01-11 ENCOUNTER — Ambulatory Visit (INDEPENDENT_AMBULATORY_CARE_PROVIDER_SITE_OTHER): Payer: 59 | Admitting: Family Medicine

## 2022-01-11 ENCOUNTER — Other Ambulatory Visit: Payer: Self-pay

## 2022-01-11 VITALS — BP 100/65 | HR 71 | Temp 97.9°F | Ht 63.0 in | Wt 170.0 lb

## 2022-01-11 DIAGNOSIS — Z6830 Body mass index (BMI) 30.0-30.9, adult: Secondary | ICD-10-CM | POA: Diagnosis not present

## 2022-01-11 DIAGNOSIS — G4709 Other insomnia: Secondary | ICD-10-CM

## 2022-01-11 DIAGNOSIS — E669 Obesity, unspecified: Secondary | ICD-10-CM | POA: Diagnosis not present

## 2022-01-11 DIAGNOSIS — Z9189 Other specified personal risk factors, not elsewhere classified: Secondary | ICD-10-CM

## 2022-01-11 DIAGNOSIS — R7303 Prediabetes: Secondary | ICD-10-CM

## 2022-01-11 DIAGNOSIS — I10 Essential (primary) hypertension: Secondary | ICD-10-CM

## 2022-01-12 ENCOUNTER — Other Ambulatory Visit (HOSPITAL_COMMUNITY): Payer: Self-pay

## 2022-01-12 MED ORDER — FAMOTIDINE 20 MG PO TABS
20.0000 mg | ORAL_TABLET | Freq: Two times a day (BID) | ORAL | 0 refills | Status: DC
  Filled 2022-01-12: qty 180, 90d supply, fill #0

## 2022-01-12 MED ORDER — LOSARTAN POTASSIUM 50 MG PO TABS
50.0000 mg | ORAL_TABLET | Freq: Every day | ORAL | 0 refills | Status: DC
  Filled 2022-01-12: qty 90, 90d supply, fill #0

## 2022-01-12 MED ORDER — SIMVASTATIN 40 MG PO TABS
40.0000 mg | ORAL_TABLET | Freq: Every evening | ORAL | 0 refills | Status: DC
  Filled 2022-01-12: qty 90, 90d supply, fill #0

## 2022-01-12 MED ORDER — TRAZODONE HCL 50 MG PO TABS
50.0000 mg | ORAL_TABLET | Freq: Every day | ORAL | 1 refills | Status: DC
  Filled 2022-01-12 – 2022-02-23 (×2): qty 90, 90d supply, fill #0
  Filled 2022-05-18: qty 90, 90d supply, fill #1

## 2022-01-12 MED ORDER — HYDROCHLOROTHIAZIDE 12.5 MG PO CAPS
12.5000 mg | ORAL_CAPSULE | ORAL | 0 refills | Status: DC
  Filled 2022-01-12: qty 90, 90d supply, fill #0

## 2022-01-13 NOTE — Progress Notes (Signed)
Chief Complaint:   OBESITY Morgan Williams is here to discuss her progress with her obesity treatment plan along with follow-up of her obesity related diagnoses. See Medical Weight Management Flowsheet for complete bioelectrical impedance results.  Today's visit was #: 54 Starting weight: 219 lbs Starting date: 09/20/2018 Weight change since last visit: 4 lbs Total lbs lost to date: 49 lbs Total weight loss percentage to date: -22.37%  Nutrition Plan: Category 2 Meal Plan with keeping a food journal and adhering to recommended goals of 400-500 calories and 35 grams of protein daily for 50% of the time. Activity: Increased walking. Anti-obesity medications: Mounjaro 15 mg subcutaneously weekly. Reported side effects: None.  Interim History: Morgan Williams says that trazodone is helping her sleep.  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  Assessment/Plan:   1. Other insomnia This is well controlled. Current treatment: trazodone 50 mg at bedtime.  Plan: Continue trazodone 50 mg at bedtime for sleep.  Will refill today.  Recommend sleep hygiene measures including regular sleep schedule, optimal sleep environment, and relaxing presleep rituals.   - Refill traZODone (DESYREL) 50 MG tablet; Take 1 tablet (50 mg total) by mouth at bedtime.  Dispense: 90 tablet; Refill: 1  2. Prediabetes, with polyphagia At goal. Goal is HgbA1c < 5.7.  Medication: Mounjaro 15 mg subcutaneously weekly.    Plan: Continue Mounjaro 15 mg subcutaneously weekly.  She will continue to focus on protein-rich, low simple carbohydrate foods. We reviewed the importance of hydration, regular exercise for stress reduction, and restorative sleep.   Lab Results  Component Value Date   HGBA1C 5.6 07/27/2021   Lab Results  Component Value Date   INSULIN 13.5 07/27/2021   INSULIN 15.8 12/24/2020   INSULIN 12.7 07/28/2020   INSULIN 8.3  09/05/2019   INSULIN 11.3 05/02/2019   3. Essential hypertension At goal. Medications: HCTZ 12.5 mg daily, losartan 50 mg daily.   Plan: Avoid buying foods that are: processed, frozen, or prepackaged to avoid excess salt. We will watch for signs of hypotension as she continues lifestyle modifications.  BP Readings from Last 3 Encounters:  01/11/22 100/65  11/29/21 101/65  10/14/21 109/71   Lab Results  Component Value Date   CREATININE 0.82 07/27/2021   4. At risk for complication associated with hypotension Morgan Williams was given approximately 8 minutes of education and counseling today regarding the condition of hypotension, the pathophysiology of the condition and concerns regarding prevention and treatment of this condition.  We discussed risks of medications used to treat hypertension, which in the setting of weight loss, can inadvertently cause hypotension.   5. Obesity with current BMI of 30.3  Course: Morgan Williams is currently in the action stage of change. As such, her goal is to continue with weight loss efforts.   Nutrition goals: She has agreed to the Category 2 Plan and keeping a food journal and adhering to recommended goals of 400-500 calories and 35 grams of protein.   Exercise goals:  As is.  Behavioral modification strategies: increasing lean protein intake, decreasing simple carbohydrates, and increasing vegetables.  Morgan Williams has agreed to follow-up with our clinic in 6 weeks. She was informed of the importance of frequent follow-up visits to maximize her success with intensive lifestyle modifications for her multiple health conditions.   Objective:   Blood pressure 100/65, pulse 71, temperature 97.9 F (36.6 C), temperature source Oral, height '5\' 3"'$  (1.6 m),  weight 170 lb (77.1 kg), last menstrual period 11/01/2003, SpO2 99 %. Body mass index is 30.11 kg/m.  General: Cooperative, alert, well developed, in no acute distress. HEENT: Conjunctivae and lids  unremarkable. Cardiovascular: Regular rhythm.  Lungs: Normal work of breathing. Neurologic: No focal deficits.   Lab Results  Component Value Date   CREATININE 0.82 07/27/2021   BUN 20 07/27/2021   NA 140 07/27/2021   K 3.8 07/27/2021   CL 102 07/27/2021   CO2 22 07/27/2021   Lab Results  Component Value Date   ALT 13 07/27/2021   AST 16 07/27/2021   ALKPHOS 78 07/27/2021   BILITOT 0.5 07/27/2021   Lab Results  Component Value Date   HGBA1C 5.6 07/27/2021   HGBA1C 5.5 12/24/2020   HGBA1C 5.8 (H) 07/28/2020   HGBA1C 5.7 (H) 02/28/2020   HGBA1C 5.6 09/05/2019   Lab Results  Component Value Date   INSULIN 13.5 07/27/2021   INSULIN 15.8 12/24/2020   INSULIN 12.7 07/28/2020   INSULIN 8.3 09/05/2019   INSULIN 11.3 05/02/2019   Lab Results  Component Value Date   TSH 0.959 07/27/2021   Lab Results  Component Value Date   CHOL 129 07/27/2021   HDL 42 07/27/2021   LDLCALC 68 07/27/2021   TRIG 102 07/27/2021   CHOLHDL 3.1 07/27/2021   Lab Results  Component Value Date   VD25OH 77.6 07/27/2021   VD25OH 66.2 12/24/2020   VD25OH 72.1 07/28/2020   Lab Results  Component Value Date   WBC 7.3 07/27/2021   HGB 12.6 07/27/2021   HCT 37.2 07/27/2021   MCV 91 07/27/2021   PLT 195 07/27/2021   Attestation Statements:   Reviewed by clinician on day of visit: allergies, medications, problem list, medical history, surgical history, family history, social history, and previous encounter notes.  I, Water quality scientist, CMA, am acting as transcriptionist for Briscoe Deutscher, DO  I have reviewed the above documentation for accuracy and completeness, and I agree with the above. -  Briscoe Deutscher, DO, MS, FAAFP, DABOM - Family and Bariatric Medicine.

## 2022-01-20 ENCOUNTER — Ambulatory Visit: Payer: 59

## 2022-01-25 ENCOUNTER — Other Ambulatory Visit: Payer: Self-pay

## 2022-01-25 ENCOUNTER — Ambulatory Visit
Admission: RE | Admit: 2022-01-25 | Discharge: 2022-01-25 | Disposition: A | Discharge status: Home / Self Care | Payer: 59 | Source: Ambulatory Visit | Attending: Obstetrics & Gynecology | Admitting: Obstetrics & Gynecology

## 2022-01-25 DIAGNOSIS — I10 Essential (primary) hypertension: Secondary | ICD-10-CM | POA: Diagnosis not present

## 2022-01-25 DIAGNOSIS — Z1231 Encounter for screening mammogram for malignant neoplasm of breast: Secondary | ICD-10-CM | POA: Diagnosis not present

## 2022-01-25 DIAGNOSIS — J45909 Unspecified asthma, uncomplicated: Secondary | ICD-10-CM | POA: Diagnosis not present

## 2022-01-25 DIAGNOSIS — E039 Hypothyroidism, unspecified: Secondary | ICD-10-CM | POA: Diagnosis not present

## 2022-01-25 DIAGNOSIS — M8588 Other specified disorders of bone density and structure, other site: Secondary | ICD-10-CM | POA: Diagnosis not present

## 2022-01-25 DIAGNOSIS — E7800 Pure hypercholesterolemia, unspecified: Secondary | ICD-10-CM | POA: Diagnosis not present

## 2022-01-25 DIAGNOSIS — E669 Obesity, unspecified: Secondary | ICD-10-CM | POA: Diagnosis not present

## 2022-01-25 DIAGNOSIS — G4700 Insomnia, unspecified: Secondary | ICD-10-CM | POA: Diagnosis not present

## 2022-01-25 DIAGNOSIS — K219 Gastro-esophageal reflux disease without esophagitis: Secondary | ICD-10-CM | POA: Diagnosis not present

## 2022-01-25 DIAGNOSIS — Z0000 Encounter for general adult medical examination without abnormal findings: Secondary | ICD-10-CM | POA: Diagnosis not present

## 2022-01-26 ENCOUNTER — Other Ambulatory Visit (HOSPITAL_COMMUNITY): Payer: Self-pay

## 2022-01-26 MED ORDER — CARESTART COVID-19 HOME TEST VI KIT
PACK | 0 refills | Status: DC
  Filled 2022-01-26: qty 4, 8d supply, fill #0

## 2022-01-27 ENCOUNTER — Other Ambulatory Visit: Payer: Self-pay | Admitting: Obstetrics & Gynecology

## 2022-01-27 DIAGNOSIS — R928 Other abnormal and inconclusive findings on diagnostic imaging of breast: Secondary | ICD-10-CM

## 2022-02-03 ENCOUNTER — Ambulatory Visit
Admission: RE | Admit: 2022-02-03 | Discharge: 2022-02-03 | Disposition: A | Discharge status: Home / Self Care | Payer: 59 | Source: Ambulatory Visit | Attending: Obstetrics & Gynecology | Admitting: Obstetrics & Gynecology

## 2022-02-03 DIAGNOSIS — R928 Other abnormal and inconclusive findings on diagnostic imaging of breast: Secondary | ICD-10-CM

## 2022-02-03 DIAGNOSIS — N6002 Solitary cyst of left breast: Secondary | ICD-10-CM | POA: Diagnosis not present

## 2022-02-23 ENCOUNTER — Other Ambulatory Visit (HOSPITAL_COMMUNITY): Payer: Self-pay

## 2022-03-03 ENCOUNTER — Other Ambulatory Visit (HOSPITAL_COMMUNITY): Payer: Self-pay

## 2022-03-03 DIAGNOSIS — I10 Essential (primary) hypertension: Secondary | ICD-10-CM | POA: Diagnosis not present

## 2022-03-03 DIAGNOSIS — E669 Obesity, unspecified: Secondary | ICD-10-CM | POA: Diagnosis not present

## 2022-03-03 DIAGNOSIS — R7303 Prediabetes: Secondary | ICD-10-CM | POA: Diagnosis not present

## 2022-03-03 DIAGNOSIS — E7800 Pure hypercholesterolemia, unspecified: Secondary | ICD-10-CM | POA: Diagnosis not present

## 2022-03-03 DIAGNOSIS — I129 Hypertensive chronic kidney disease with stage 1 through stage 4 chronic kidney disease, or unspecified chronic kidney disease: Secondary | ICD-10-CM | POA: Diagnosis not present

## 2022-03-03 DIAGNOSIS — E039 Hypothyroidism, unspecified: Secondary | ICD-10-CM | POA: Diagnosis not present

## 2022-03-03 DIAGNOSIS — M8588 Other specified disorders of bone density and structure, other site: Secondary | ICD-10-CM | POA: Diagnosis not present

## 2022-03-03 DIAGNOSIS — G4700 Insomnia, unspecified: Secondary | ICD-10-CM | POA: Diagnosis not present

## 2022-03-03 MED ORDER — LEVOTHYROXINE SODIUM 88 MCG PO TABS
88.0000 ug | ORAL_TABLET | ORAL | 0 refills | Status: DC
  Filled 2022-03-03: qty 48, 84d supply, fill #0

## 2022-03-03 MED ORDER — WEGOVY 2.4 MG/0.75ML ~~LOC~~ SOAJ
2.4000 mg | SUBCUTANEOUS | 0 refills | Status: DC
  Filled 2022-03-03 – 2022-06-15 (×4): qty 3, 28d supply, fill #0

## 2022-03-03 MED ORDER — LEVOTHYROXINE SODIUM 100 MCG PO TABS
100.0000 ug | ORAL_TABLET | ORAL | 0 refills | Status: DC
  Filled 2022-03-03: qty 36, 84d supply, fill #0
  Filled 2022-05-18: qty 38, 89d supply, fill #1

## 2022-03-11 ENCOUNTER — Other Ambulatory Visit (HOSPITAL_COMMUNITY): Payer: Self-pay

## 2022-03-15 ENCOUNTER — Other Ambulatory Visit (HOSPITAL_COMMUNITY): Payer: Self-pay

## 2022-03-15 MED ORDER — WEGOVY 2.4 MG/0.75ML ~~LOC~~ SOAJ
2.4000 mg | SUBCUTANEOUS | 0 refills | Status: DC
  Filled 2022-03-15: qty 9, 84d supply, fill #0

## 2022-03-16 ENCOUNTER — Other Ambulatory Visit (HOSPITAL_COMMUNITY): Payer: Self-pay

## 2022-04-14 DIAGNOSIS — E039 Hypothyroidism, unspecified: Secondary | ICD-10-CM | POA: Diagnosis not present

## 2022-04-14 DIAGNOSIS — I10 Essential (primary) hypertension: Secondary | ICD-10-CM | POA: Diagnosis not present

## 2022-04-14 DIAGNOSIS — I129 Hypertensive chronic kidney disease with stage 1 through stage 4 chronic kidney disease, or unspecified chronic kidney disease: Secondary | ICD-10-CM | POA: Diagnosis not present

## 2022-04-14 DIAGNOSIS — E7800 Pure hypercholesterolemia, unspecified: Secondary | ICD-10-CM | POA: Diagnosis not present

## 2022-04-14 DIAGNOSIS — R7303 Prediabetes: Secondary | ICD-10-CM | POA: Diagnosis not present

## 2022-04-14 DIAGNOSIS — E669 Obesity, unspecified: Secondary | ICD-10-CM | POA: Diagnosis not present

## 2022-04-14 DIAGNOSIS — G4700 Insomnia, unspecified: Secondary | ICD-10-CM | POA: Diagnosis not present

## 2022-04-14 DIAGNOSIS — M8580 Other specified disorders of bone density and structure, unspecified site: Secondary | ICD-10-CM | POA: Diagnosis not present

## 2022-04-14 DIAGNOSIS — R809 Proteinuria, unspecified: Secondary | ICD-10-CM | POA: Diagnosis not present

## 2022-04-20 ENCOUNTER — Other Ambulatory Visit (HOSPITAL_COMMUNITY): Payer: Self-pay

## 2022-04-20 MED ORDER — LOSARTAN POTASSIUM 50 MG PO TABS
50.0000 mg | ORAL_TABLET | Freq: Every day | ORAL | 2 refills | Status: DC
  Filled 2022-04-20: qty 90, 90d supply, fill #0

## 2022-04-20 MED ORDER — FAMOTIDINE 20 MG PO TABS
20.0000 mg | ORAL_TABLET | Freq: Two times a day (BID) | ORAL | 2 refills | Status: DC
  Filled 2022-04-20: qty 180, 90d supply, fill #0
  Filled 2022-07-16: qty 180, 90d supply, fill #1
  Filled 2022-10-12: qty 180, 90d supply, fill #2

## 2022-04-20 MED ORDER — LOSARTAN POTASSIUM 25 MG PO TABS
25.0000 mg | ORAL_TABLET | Freq: Every day | ORAL | 3 refills | Status: DC
Start: 2022-04-20 — End: 2022-09-19
  Filled 2022-04-20: qty 90, 90d supply, fill #0
  Filled 2022-07-16: qty 90, 90d supply, fill #1

## 2022-04-20 MED ORDER — SIMVASTATIN 40 MG PO TABS
40.0000 mg | ORAL_TABLET | Freq: Every evening | ORAL | 3 refills | Status: DC
  Filled 2022-04-20: qty 90, 90d supply, fill #0
  Filled 2022-07-16: qty 90, 90d supply, fill #1
  Filled 2022-10-12: qty 90, 90d supply, fill #2
  Filled 2023-01-08 – 2023-04-10 (×2): qty 90, 90d supply, fill #3

## 2022-04-21 ENCOUNTER — Other Ambulatory Visit (HOSPITAL_COMMUNITY): Payer: Self-pay

## 2022-04-22 ENCOUNTER — Other Ambulatory Visit (HOSPITAL_COMMUNITY): Payer: Self-pay

## 2022-04-22 MED ORDER — HYDROCHLOROTHIAZIDE 12.5 MG PO CAPS
12.5000 mg | ORAL_CAPSULE | Freq: Every day | ORAL | 0 refills | Status: DC
  Filled 2022-04-22: qty 90, 90d supply, fill #0

## 2022-05-18 ENCOUNTER — Other Ambulatory Visit (HOSPITAL_COMMUNITY): Payer: Self-pay

## 2022-05-18 MED ORDER — ALPRAZOLAM 0.5 MG PO TABS
0.2500 mg | ORAL_TABLET | Freq: Every evening | ORAL | 0 refills | Status: DC | PRN
  Filled 2022-05-18: qty 30, 30d supply, fill #0

## 2022-05-18 MED ORDER — METRONIDAZOLE 0.75 % EX LOTN
TOPICAL_LOTION | CUTANEOUS | 0 refills | Status: DC
Start: 2022-05-18 — End: 2022-09-16
  Filled 2022-05-18: qty 177, 90d supply, fill #0

## 2022-05-18 MED ORDER — LEVOTHYROXINE SODIUM 88 MCG PO TABS
88.0000 ug | ORAL_TABLET | ORAL | 0 refills | Status: DC
  Filled 2022-05-18: qty 51, 87d supply, fill #0

## 2022-06-06 ENCOUNTER — Other Ambulatory Visit (HOSPITAL_COMMUNITY): Payer: Self-pay

## 2022-06-08 ENCOUNTER — Encounter (INDEPENDENT_AMBULATORY_CARE_PROVIDER_SITE_OTHER): Payer: Self-pay

## 2022-06-09 DIAGNOSIS — I10 Essential (primary) hypertension: Secondary | ICD-10-CM | POA: Diagnosis not present

## 2022-06-09 DIAGNOSIS — R632 Polyphagia: Secondary | ICD-10-CM | POA: Diagnosis not present

## 2022-06-09 DIAGNOSIS — E039 Hypothyroidism, unspecified: Secondary | ICD-10-CM | POA: Diagnosis not present

## 2022-06-09 DIAGNOSIS — R7303 Prediabetes: Secondary | ICD-10-CM | POA: Diagnosis not present

## 2022-06-09 DIAGNOSIS — Z6829 Body mass index (BMI) 29.0-29.9, adult: Secondary | ICD-10-CM | POA: Diagnosis not present

## 2022-06-09 DIAGNOSIS — E7800 Pure hypercholesterolemia, unspecified: Secondary | ICD-10-CM | POA: Diagnosis not present

## 2022-06-09 DIAGNOSIS — Z8639 Personal history of other endocrine, nutritional and metabolic disease: Secondary | ICD-10-CM | POA: Diagnosis not present

## 2022-06-15 ENCOUNTER — Other Ambulatory Visit (HOSPITAL_COMMUNITY): Payer: Self-pay

## 2022-06-15 MED ORDER — WEGOVY 2.4 MG/0.75ML ~~LOC~~ SOAJ
2.4000 mg | SUBCUTANEOUS | 0 refills | Status: DC
  Filled 2022-06-15: qty 9, 84d supply, fill #0

## 2022-06-21 ENCOUNTER — Other Ambulatory Visit (HOSPITAL_COMMUNITY): Payer: Self-pay

## 2022-06-28 DIAGNOSIS — H25813 Combined forms of age-related cataract, bilateral: Secondary | ICD-10-CM | POA: Diagnosis not present

## 2022-06-28 DIAGNOSIS — H524 Presbyopia: Secondary | ICD-10-CM | POA: Diagnosis not present

## 2022-06-28 DIAGNOSIS — H04123 Dry eye syndrome of bilateral lacrimal glands: Secondary | ICD-10-CM | POA: Diagnosis not present

## 2022-06-28 DIAGNOSIS — H40023 Open angle with borderline findings, high risk, bilateral: Secondary | ICD-10-CM | POA: Diagnosis not present

## 2022-06-28 DIAGNOSIS — E119 Type 2 diabetes mellitus without complications: Secondary | ICD-10-CM | POA: Diagnosis not present

## 2022-06-29 ENCOUNTER — Other Ambulatory Visit (HOSPITAL_COMMUNITY): Payer: Self-pay

## 2022-06-29 MED ORDER — AMOXICILLIN 500 MG PO CAPS
2000.0000 mg | ORAL_CAPSULE | ORAL | 0 refills | Status: DC
  Filled 2022-06-29: qty 12, 3d supply, fill #0

## 2022-07-01 ENCOUNTER — Other Ambulatory Visit (HOSPITAL_COMMUNITY): Payer: Self-pay

## 2022-07-01 MED ORDER — MAXITROL 3.5-10000-0.1 OP OINT
1.0000 | TOPICAL_OINTMENT | OPHTHALMIC | 3 refills | Status: AC | PRN
  Filled 2022-07-01: qty 3.5, 7d supply, fill #0
  Filled 2022-09-15: qty 3.5, 7d supply, fill #1
  Filled 2022-11-08: qty 7, 30d supply, fill #2

## 2022-07-16 ENCOUNTER — Other Ambulatory Visit (HOSPITAL_COMMUNITY): Payer: Self-pay

## 2022-07-18 ENCOUNTER — Other Ambulatory Visit (HOSPITAL_COMMUNITY): Payer: Self-pay

## 2022-07-18 MED ORDER — HYDROCHLOROTHIAZIDE 12.5 MG PO CAPS
12.5000 mg | ORAL_CAPSULE | Freq: Every day | ORAL | 0 refills | Status: DC
  Filled 2022-07-18: qty 90, 90d supply, fill #0

## 2022-07-27 ENCOUNTER — Other Ambulatory Visit (HOSPITAL_COMMUNITY): Payer: Self-pay

## 2022-08-02 ENCOUNTER — Other Ambulatory Visit (HOSPITAL_COMMUNITY): Payer: Self-pay

## 2022-08-02 MED ORDER — INFLUENZA VAC SPLIT QUAD 0.5 ML IM SUSY
0.5000 mL | PREFILLED_SYRINGE | INTRAMUSCULAR | 0 refills | Status: DC
  Filled 2022-08-02: qty 0.5, 1d supply, fill #0

## 2022-08-03 ENCOUNTER — Other Ambulatory Visit (INDEPENDENT_AMBULATORY_CARE_PROVIDER_SITE_OTHER): Payer: Self-pay

## 2022-08-03 ENCOUNTER — Other Ambulatory Visit (HOSPITAL_COMMUNITY): Payer: Self-pay

## 2022-08-04 ENCOUNTER — Other Ambulatory Visit (HOSPITAL_COMMUNITY): Payer: Self-pay

## 2022-08-04 DIAGNOSIS — Z6830 Body mass index (BMI) 30.0-30.9, adult: Secondary | ICD-10-CM | POA: Diagnosis not present

## 2022-08-04 DIAGNOSIS — F418 Other specified anxiety disorders: Secondary | ICD-10-CM | POA: Diagnosis not present

## 2022-08-04 DIAGNOSIS — E6609 Other obesity due to excess calories: Secondary | ICD-10-CM | POA: Diagnosis not present

## 2022-08-04 DIAGNOSIS — R632 Polyphagia: Secondary | ICD-10-CM | POA: Diagnosis not present

## 2022-08-04 DIAGNOSIS — E039 Hypothyroidism, unspecified: Secondary | ICD-10-CM | POA: Diagnosis not present

## 2022-08-04 MED ORDER — LEVOTHYROXINE SODIUM 88 MCG PO TABS
88.0000 ug | ORAL_TABLET | Freq: Every day | ORAL | 0 refills | Status: DC
  Filled 2022-08-04: qty 30, 30d supply, fill #0

## 2022-08-04 MED ORDER — HYDROCHLOROTHIAZIDE 12.5 MG PO CAPS
12.5000 mg | ORAL_CAPSULE | Freq: Every day | ORAL | 0 refills | Status: DC
  Filled 2022-08-04 – 2022-10-12 (×2): qty 90, 90d supply, fill #0

## 2022-08-04 MED ORDER — WEGOVY 1.7 MG/0.75ML ~~LOC~~ SOAJ
1.7000 mg | SUBCUTANEOUS | 0 refills | Status: DC
  Filled 2022-08-04: qty 9, 84d supply, fill #0

## 2022-08-08 ENCOUNTER — Other Ambulatory Visit (HOSPITAL_COMMUNITY): Payer: Self-pay

## 2022-08-08 MED ORDER — LEVOTHYROXINE SODIUM 88 MCG PO TABS
88.0000 ug | ORAL_TABLET | Freq: Every day | ORAL | 0 refills | Status: DC
  Filled 2022-08-08: qty 90, 90d supply, fill #0

## 2022-08-10 ENCOUNTER — Other Ambulatory Visit (HOSPITAL_COMMUNITY): Payer: Self-pay

## 2022-08-10 MED ORDER — TRAZODONE HCL 50 MG PO TABS
50.0000 mg | ORAL_TABLET | Freq: Every day | ORAL | 1 refills | Status: DC
  Filled 2022-08-10: qty 90, 90d supply, fill #0
  Filled 2022-10-25: qty 90, 90d supply, fill #1

## 2022-08-17 ENCOUNTER — Encounter: Payer: Self-pay | Admitting: *Deleted

## 2022-08-18 ENCOUNTER — Ambulatory Visit (INDEPENDENT_AMBULATORY_CARE_PROVIDER_SITE_OTHER): Payer: 59 | Admitting: Diagnostic Neuroimaging

## 2022-08-18 ENCOUNTER — Telehealth: Payer: Self-pay | Admitting: Diagnostic Neuroimaging

## 2022-08-18 ENCOUNTER — Encounter: Payer: Self-pay | Admitting: Diagnostic Neuroimaging

## 2022-08-18 VITALS — BP 111/65 | HR 78 | Ht 63.0 in | Wt 175.0 lb

## 2022-08-18 DIAGNOSIS — H109 Unspecified conjunctivitis: Secondary | ICD-10-CM

## 2022-08-18 DIAGNOSIS — Q283 Other malformations of cerebral vessels: Secondary | ICD-10-CM | POA: Diagnosis not present

## 2022-08-18 DIAGNOSIS — H538 Other visual disturbances: Secondary | ICD-10-CM | POA: Diagnosis not present

## 2022-08-18 NOTE — Patient Instructions (Signed)
  ENLARGED CONJUNCTIVAL VESSELS - check CTA head (rule out carotid-cavernous fistula)

## 2022-08-18 NOTE — Progress Notes (Signed)
[facility]  PATIENT: Morgan Williams DOB: 05-13-1956  REFERRING CLINICIAN: Lisabeth Pick, MD HISTORY FROM: patient REASON FOR VISIT: new consult   HISTORICAL  CHIEF COMPLAINT:  Chief Complaint  Patient presents with   Eye Problem    RM 7 alone Pt is well, denied is visual or occular complications     HISTORY OF PRESENT ILLNESS:   66 year old female here for evaluation of enlarged conjunctival vessels.  Patient has noted "bloodshot eyes" for many years.  This was noted by her ophthalmologist years ago.  More recently she had a routine eye exam and her ophthalmologist recommended neurology consult to rule out carotid-cavernous fistula.  Patient has noticed the redness in the right greater than left eye over many years.  She denies any eye pain, headaches, pulsatile tinnitus, proptosis, or double vision.  She has some mild blurred vision issues which are chronic.  She also has glaucoma and cataracts.   REVIEW OF SYSTEMS: Full 14 system review of systems performed and negative with exception of: as per HPI.  ALLERGIES: Allergies  Allergen Reactions   Sulfa Antibiotics Rash    HOME MEDICATIONS: Outpatient Medications Prior to Visit  Medication Sig Dispense Refill   ALPRAZolam (XANAX) 0.5 MG tablet Take 0.5-1 tablets (0.25-0.5 mg total) by mouth at bedtime as needed for sleep 30 tablet 0   amoxicillin (AMOXIL) 500 MG capsule Take 4 capsules (2,000 mg total) by mouth one hour proir to dental treatment. 12 capsule 0   famotidine (PEPCID) 20 MG tablet Take 1 tablet (20 mg total) by mouth 2 (two) times daily. 180 tablet 2   hydrochlorothiazide (MICROZIDE) 12.5 MG capsule Take 1 capsule (12.5 mg total) by mouth daily. 90 capsule 0   levothyroxine (SYNTHROID) 88 MCG tablet Take 1 tablet (88 mcg total) by mouth daily. 90 tablet 0   losartan (COZAAR) 25 MG tablet Take 1 tablet (25 mg total) by mouth daily. 90 tablet 3   METRONIDAZOLE, TOPICAL, 0.75 % LOTN APPLY AND  GENTLY MASSAGE INTO AFFECTED AREA(S) OF FACE EXTERNALLY ONCE DAILY. 177 mL 0   neomycin-polymyxin-dexameth (MAXITROL) 0.1 % OINT Place 1 Application into both eyes as needed. 3.5 g 3   olopatadine (PATANOL) 0.1 % ophthalmic solution olopatadine 0.1 % eye drops  INSTILL 1 DROP BOTH EYES TWICE A DAY     Semaglutide-Weight Management (WEGOVY) 1.7 MG/0.75ML SOAJ Inject 1.7 mg into the skin once a week. 9 mL 0   simvastatin (ZOCOR) 40 MG tablet Take 1 tablet (40 mg total) by mouth every evening. 90 tablet 3   traZODone (DESYREL) 50 MG tablet Take 1 tablet (50 mg total) by mouth at bedtime. 90 tablet 1   COVID-19 At Home Antigen Test (CARESTART COVID-19 HOME TEST) KIT use as directed 4 each 0   COVID-19 mRNA bivalent vaccine, Pfizer, (PFIZER COVID-19 VAC BIVALENT) injection Inject into the muscle. 0.3 mL 0   COVID-19 mRNA Vac-TriS, Pfizer, (PFIZER-BIONT COVID-19 VAC-TRIS) SUSP injection Inject into the muscle. 0.3 mL 0   influenza vac split quadrivalent PF (FLUARIX) 0.5 ML injection Inject into the muscle. 0.5 mL 0   influenza vac split quadrivalent PF (FLUARIX) 0.5 ML injection Inject 0.5 mLs into the muscle. 0.5 mL 0   acetaminophen (TYLENOL) 500 MG tablet Take 500 mg by mouth every 6 (six) hours as needed for moderate pain.  (Patient not taking: Reported on 08/18/2022)     ALPRAZolam (XANAX) 0.5 MG tablet Take 0.5-1 tablet (0.25-0.5 mg total) by mouth at bedtime  as needed for sleep (Patient not taking: Reported on 08/18/2022) 30 tablet 0   ALPRAZolam (XANAX) 0.5 MG tablet Take 0.5-1 tablets (0.25-0.5 mg total) by mouth at bedtime as needed for sleep (Patient not taking: Reported on 08/18/2022) 30 tablet 0   cetirizine (ZYRTEC) 10 MG tablet Take 10 mg by mouth daily. At bedtime (Patient not taking: Reported on 08/18/2022)     Cholecalciferol (VITAMIN D3) 1.25 MG (50000 UT) CAPS SMARTSIG:By Mouth (Patient not taking: Reported on 08/18/2022)     levothyroxine (SYNTHROID) 88 MCG tablet Take 1 tablet (88  mcg total) by mouth daily before breakfast. (Patient not taking: Reported on 08/18/2022) 90 tablet 0   levothyroxine (SYNTHROID) 88 MCG tablet Take 1 tablet (88 mcg total) by mouth 4 (four) times a week.  Take in the morning on an emtpy stomach (Patient not taking: Reported on 08/18/2022) 90 tablet 0   levothyroxine (SYNTHROID) 88 MCG tablet Take 1 tablet (88 mcg total) by mouth daily. (Patient not taking: Reported on 08/18/2022) 30 tablet 0   losartan (COZAAR) 50 MG tablet Take 1/2 tablet by mouth once daily. (Patient not taking: Reported on 08/18/2022) 30 tablet 3   Semaglutide-Weight Management (WEGOVY) 2.4 MG/0.75ML SOAJ Inject 2.4 mg into the skin once a week. (Patient not taking: Reported on 08/18/2022) 3 mL 0   Semaglutide-Weight Management (WEGOVY) 2.4 MG/0.75ML SOAJ Inject 2.4 mg into the skin once a week. (Patient not taking: Reported on 08/18/2022) 9 mL 0   Semaglutide-Weight Management (WEGOVY) 2.4 MG/0.75ML SOAJ Inject 2.4 mg into the skin once a week. (Patient not taking: Reported on 08/18/2022) 9 mL 0   tirzepatide (MOUNJARO) 15 MG/0.5ML Pen Inject 15 mg into the skin once a week. (Patient not taking: Reported on 08/18/2022) 6 mL 0   No facility-administered medications prior to visit.    PAST MEDICAL HISTORY: Past Medical History:  Diagnosis Date   Allergy    Anxiety    Aortic atherosclerosis (HCC)    Arthritis    Asthma    Depression    Eczema    Fatty liver    GERD (gastroesophageal reflux disease)    Glaucoma    Heart murmur    Hyperlipidemia    Hypertension    Hypertensive retinopathy    Hypothyroidism    Insomnia    Kidney problem    Obesity    Osteoarthritis    Osteopenia    Prediabetes    Rosacea    Thyroid disease    Vitamin D deficiency    Vocal cord nodules     PAST SURGICAL HISTORY: Past Surgical History:  Procedure Laterality Date   CESAREAN SECTION     x 1   MICROLARYNGOSCOPY Left 11/06/2017   Procedure: MICRO DIRECT LARYNGOSCOPY REMOVAL OF  VOCAL CORD;  Surgeon: Jodi Marble, MD;  Location: [place];  Service: ENT;  Laterality: Left;   THYROIDECTOMY, PARTIAL  1975   due to [place] disease   TOTAL KNEE ARTHROPLASTY Left 03/09/2020   Procedure: TOTAL KNEE ARTHROPLASTY;  Surgeon: Gaynelle Arabian, MD;  Location: [facility];  Service: Orthopedics;  Laterality: Left;    FAMILY HISTORY: Family History  Problem Relation Age of Onset   Hypertension Mother    Hyperlipidemia Mother    Diabetes Mother    Thyroid disease Mother    Hypertension Father    Heart disease Father        CHF   Colon cancer Father 14       stage 37  Kidney disease Father    Sleep apnea Father    Obesity Father    Thyroid disease Daughter    Esophageal cancer Neg Hx    Rectal cancer Neg Hx    Liver cancer Neg Hx    Pancreatic cancer Neg Hx    Stomach cancer Neg Hx    Breast cancer Neg Hx     SOCIAL HISTORY: Social History   Socioeconomic History   Marital status: Divorced    Spouse name: Not on file   Number of children: 2   Years of education: Not on file   Highest education level: Not on file  Occupational History   Occupation: Pharmacist  Tobacco Use   Smoking status: Former    Years: 25.00    Types: Cigarettes    Quit date: 10/31/1998    Years since quitting: 23.8   Smokeless tobacco: Never  Vaping Use   Vaping Use: Never used  Substance and Sexual Activity   Alcohol use: Yes    Alcohol/week: 2.0 standard drinks of alcohol    Types: 2 Glasses of wine per week   Drug use: No   Sexual activity: Not Currently  Other Topics Concern   Not on file  Social History Narrative   Not on file   Social Determinants of Health   Financial Resource Strain: Not on file  Food Insecurity: Not on file  Transportation Needs: Not on file  Physical Activity: Not on file  Stress: Not on file  Social Connections: Not on file  Intimate Partner Violence: Not on file     PHYSICAL EXAM  GENERAL EXAM/CONSTITUTIONAL: Vitals:   Vitals:   08/18/22 1317  BP: 111/65  Pulse: 78  Weight: 175 lb (79.4 kg)  Height: _0  (1.6 m)   Body mass index is 31 kg/m. Wt Readings from Last 3 Encounters:  08/18/22 175 lb (79.4 kg)  01/11/22 170 lb (77.1 kg)  11/29/21 174 lb (78.9 kg)   Patient is in no distress; well developed, nourished and groomed; neck is supple  CARDIOVASCULAR: Examination of carotid arteries is normal; no carotid bruits Regular rate and rhythm, no murmurs Examination of peripheral vascular system by observation and palpation is normal  EYES: Ophthalmoscopic exam of optic discs and posterior segments is normal; no papilledema or hemorrhages; SLIGHTLY PROMINENT CONJUNCTIVAL VESSELS; RIGHT > LEFT SLIGHT PROPTOSIS OF RIGHT EYE Vision Screening   Right eye Left eye Both eyes  Without correction _1  With correction       MUSCULOSKELETAL: Gait, strength, tone, movements noted in Neurologic exam below  NEUROLOGIC: MENTAL STATUS:      No data to display         awake, alert, oriented to person, place and time recent and remote memory intact normal attention and concentration language fluent, comprehension intact, naming intact fund of knowledge appropriate  CRANIAL NERVE:  2nd - no papilledema on fundoscopic exam 2nd, 3rd, 4th, 6th - pupils equal and reactive to light, visual fields full to confrontation, extraocular muscles intact, no nystagmus 5th - facial sensation symmetric 7th - facial strength symmetric 8th - hearing intact 9th - palate elevates symmetrically, uvula midline 11th - shoulder shrug symmetric 12th - tongue protrusion midline  MOTOR:  normal bulk and tone, full strength in the BUE, BLE  SENSORY:  normal and symmetric to light touch, temperature, vibration  COORDINATION:  finger-nose-finger, fine finger movements normal  REFLEXES:  deep tendon reflexes present and symmetric  GAIT/STATION:  narrow based gait  DIAGNOSTIC DATA (LABS,  IMAGING, TESTING) - I reviewed patient records, labs, notes, testing and imaging myself where available.  Lab Results  Component Value Date   WBC 7.3 07/27/2021   HGB 12.6 07/27/2021   HCT 37.2 07/27/2021   MCV 91 07/27/2021   PLT 195 07/27/2021      Component Value Date/Time   NA 140 07/27/2021 1243   K 3.8 07/27/2021 1243   CL 102 07/27/2021 1243   CO2 22 07/27/2021 1243   GLUCOSE 84 07/27/2021 1243   GLUCOSE 101 (H) 02/28/2020 1110   BUN 20 07/27/2021 1243   CREATININE 0.82 07/27/2021 1243   CALCIUM 9.9 07/27/2021 1243   PROT 7.1 07/27/2021 1243   ALBUMIN 4.7 07/27/2021 1243   AST 16 07/27/2021 1243   ALT 13 07/27/2021 1243   ALKPHOS 78 07/27/2021 1243   BILITOT 0.5 07/27/2021 1243   GFRNONAA 79 12/24/2020 0840   GFRAA 91 12/24/2020 0840   Lab Results  Component Value Date   CHOL 129 07/27/2021   HDL 42 07/27/2021   LDLCALC 68 07/27/2021   TRIG 102 07/27/2021   CHOLHDL 3.1 07/27/2021   Lab Results  Component Value Date   HGBA1C 5.6 07/27/2021   Lab Results  Component Value Date   VITAMINB12 563 09/20/2018   Lab Results  Component Value Date   TSH 0.959 07/27/2021       ASSESSMENT AND PLAN  66 y.o. year old female here with:   Dx:  1. Conjunctivitis of both eyes, unspecified conjunctivitis type   2. Blurred vision, bilateral   3. Other malformations of cerebral vessels      PLAN:  ENLARGED CONJUNCTIVAL VESSELS - check CTA head (rule out carotid-cavernous fistula)  Orders Placed This Encounter  Procedures   CT ANGIO HEAD W OR WO CONTRAST   Return for pending test results, pending if symptoms worsen or fail to improve.    Penni Bombard, MD 65/99/7877, 6:54 PM Certified in Neurology, Neurophysiology and Neuroimaging  [facility] [address] [place], [place] [address] [phone]

## 2022-08-18 NOTE — Telephone Encounter (Signed)
UM[facility] NPR sent to GI [phone] 

## 2022-09-01 ENCOUNTER — Other Ambulatory Visit (HOSPITAL_BASED_OUTPATIENT_CLINIC_OR_DEPARTMENT_OTHER): Payer: Self-pay

## 2022-09-01 ENCOUNTER — Encounter (HOSPITAL_BASED_OUTPATIENT_CLINIC_OR_DEPARTMENT_OTHER): Payer: Self-pay | Admitting: Obstetrics & Gynecology

## 2022-09-01 ENCOUNTER — Ambulatory Visit (INDEPENDENT_AMBULATORY_CARE_PROVIDER_SITE_OTHER): Payer: 59 | Admitting: Obstetrics & Gynecology

## 2022-09-01 VITALS — BP 124/53 | HR 69 | Ht 63.0 in | Wt 175.8 lb

## 2022-09-01 DIAGNOSIS — Z01419 Encounter for gynecological examination (general) (routine) without abnormal findings: Secondary | ICD-10-CM

## 2022-09-01 DIAGNOSIS — E559 Vitamin D deficiency, unspecified: Secondary | ICD-10-CM

## 2022-09-01 DIAGNOSIS — Z780 Asymptomatic menopausal state: Secondary | ICD-10-CM | POA: Diagnosis not present

## 2022-09-01 DIAGNOSIS — I10 Essential (primary) hypertension: Secondary | ICD-10-CM

## 2022-09-01 MED ORDER — COMIRNATY 30 MCG/0.3ML IM SUSY
PREFILLED_SYRINGE | INTRAMUSCULAR | 0 refills | Status: AC
  Filled 2022-09-01: qty 0.3, 1d supply, fill #0

## 2022-09-01 NOTE — Progress Notes (Signed)
66 y.o. G24P2002 Divorced White or Caucasian female here for annual exam.  Doing well.  Denies vaginal bleeding.    Still seeing Dr. Juleen China for weight loss.  Has lost another 10 pounds from last year.    Pt is going to retire next year.    Patient's last menstrual period was 11/01/2003 (approximate).          Sexually active: No.  The current method of family planning is post menopausal status.    Smoker:  no  Health Maintenance: Pap:  08/19/2021 Negative with neg HR HPV History of abnormal Pap:  no MMG:  01/25/2022 f/u imaging was normal.   Colonoscopy:  12/28/2017, follow up 10 years BMD:   09/03/2020 Osteopenia, -1.7 Screening Labs: done with Dr. Kathyrn Lass   reports that she quit smoking about 23 years ago. Her smoking use included cigarettes. She has never used smokeless tobacco. She reports current alcohol use of about 2.0 standard drinks of alcohol per week. She reports that she does not use drugs.  Past Medical History:  Diagnosis Date   Allergy    Anxiety    Aortic atherosclerosis (HCC)    Arthritis    Asthma    Depression    Eczema    Fatty liver    GERD (gastroesophageal reflux disease)    Glaucoma    Heart murmur    Hyperlipidemia    Hypertension    Hypertensive retinopathy    Hypothyroidism    Insomnia    Kidney problem    Obesity    Osteoarthritis    Osteopenia    Prediabetes    Rosacea    Thyroid disease    Vitamin D deficiency    Vocal cord nodules     Past Surgical History:  Procedure Laterality Date   CESAREAN SECTION     x 1   MICROLARYNGOSCOPY Left 11/06/2017   Procedure: MICRO DIRECT LARYNGOSCOPY REMOVAL OF VOCAL CORD;  Surgeon: Jodi Marble, MD;  Location: [place];  Service: ENT;  Laterality: Left;   THYROIDECTOMY, PARTIAL  1975   due to [place] disease   TOTAL KNEE ARTHROPLASTY Left 03/09/2020   Procedure: TOTAL KNEE ARTHROPLASTY;  Surgeon: Gaynelle Arabian, MD;  Location: [facility];  Service: Orthopedics;  Laterality:  Left;    Current Outpatient Medications  Medication Sig Dispense Refill   ALPRAZolam (XANAX) 0.5 MG tablet Take 0.5-1 tablets (0.25-0.5 mg total) by mouth at bedtime as needed for sleep 30 tablet 0   amoxicillin (AMOXIL) 500 MG capsule Take 4 capsules (2,000 mg total) by mouth one hour proir to dental treatment. 12 capsule 0   COVID-19 mRNA vaccine 2023-2024 (COMIRNATY) syringe Inject into the muscle. 0.3 mL 0   famotidine (PEPCID) 20 MG tablet Take 1 tablet (20 mg total) by mouth 2 (two) times daily. 180 tablet 2   hydrochlorothiazide (MICROZIDE) 12.5 MG capsule Take 1 capsule (12.5 mg total) by mouth daily. 90 capsule 0   levothyroxine (SYNTHROID) 88 MCG tablet Take 1 tablet (88 mcg total) by mouth daily. 90 tablet 0   losartan (COZAAR) 25 MG tablet Take 1 tablet (25 mg total) by mouth daily. 90 tablet 3   METRONIDAZOLE, TOPICAL, 0.75 % LOTN APPLY AND GENTLY MASSAGE INTO AFFECTED AREA(S) OF FACE EXTERNALLY ONCE DAILY. 177 mL 0   neomycin-polymyxin-dexameth (MAXITROL) 0.1 % OINT Place 1 Application into both eyes as needed. 3.5 g 3   olopatadine (PATANOL) 0.1 % ophthalmic solution olopatadine 0.1 % eye drops  INSTILL 1  DROP BOTH EYES TWICE A DAY     Semaglutide-Weight Management (WEGOVY) 1.7 MG/0.75ML SOAJ Inject 1.7 mg into the skin once a week. 9 mL 0   simvastatin (ZOCOR) 40 MG tablet Take 1 tablet (40 mg total) by mouth every evening. 90 tablet 3   traZODone (DESYREL) 50 MG tablet Take 1 tablet (50 mg total) by mouth at bedtime. 90 tablet 1   No current facility-administered medications for this visit.    Family History  Problem Relation Age of Onset   Hypertension Mother    Hyperlipidemia Mother    Diabetes Mother    Thyroid disease Mother    Hypertension Father    Heart disease Father        CHF   Colon cancer Father 23       stage 4   Kidney disease Father    Sleep apnea Father    Obesity Father    Thyroid disease Daughter    Esophageal cancer Neg Hx    Rectal cancer  Neg Hx    Liver cancer Neg Hx    Pancreatic cancer Neg Hx    Stomach cancer Neg Hx    Breast cancer Neg Hx     ROS: Constitutional: negative Genitourinary:negative  Exam:   BP (!) 124/53 (BP Location: Left Arm, Patient Position: Sitting, Cuff Size: Large)   Pulse 69   Ht '5\' 3"'$  (1.6 m) Comment: Reported  Wt 175 lb 12.8 oz (79.7 kg)   LMP 11/01/2003 (Approximate)   BMI 31.14 kg/m   Height: '5\' 3"'$  (160 cm) (Reported)  General appearance: alert, cooperative and appears stated age Head: Normocephalic, without obvious abnormality, atraumatic Neck: no adenopathy, supple, symmetrical, trachea midline and thyroid normal to inspection and palpation Lungs: clear to auscultation bilaterally Breasts: normal appearance, no masses or tenderness Heart: regular rate and rhythm Abdomen: soft, non-tender; bowel sounds normal; no masses,  no organomegaly Extremities: extremities normal, atraumatic, no cyanosis or edema Skin: Skin color, texture, turgor normal. No rashes or lesions Lymph nodes: Cervical, supraclavicular, and axillary nodes normal. No abnormal inguinal nodes palpated Neurologic: Grossly normal   Pelvic: External genitalia:  no lesions              Urethra:  normal appearing urethra with no masses, tenderness or lesions              Bartholins and Skenes: normal                 Vagina: normal appearing vagina with normal color and no discharge, no lesions              Cervix: no lesions              Pap taken: No. Bimanual Exam:  Uterus:  normal size, contour, position, consistency, mobility, non-tender              Adnexa: normal adnexa and no mass, fullness, tenderness               Rectovaginal: Confirms               Anus:  normal sphincter tone, no lesions  Chaperone, Octaviano Batty, CMA, was present for exam.  Assessment/Plan: 1. Well woman exam with routine gynecological exam - Pap smear neg 2022 - Mammogram 2023 - Colonoscopy 2019 - Bone mineral density 2021 - lab  work done with Dr. Juleen China - vaccines reviewed/updated  2. Postmenopausal -  no HRT  3. Primary hypertension  4. Vitamin  D deficiency

## 2022-09-05 ENCOUNTER — Ambulatory Visit
Admission: RE | Admit: 2022-09-05 | Discharge: 2022-09-05 | Disposition: A | Discharge status: Home / Self Care | Payer: 59 | Source: Ambulatory Visit | Attending: Diagnostic Neuroimaging | Admitting: Diagnostic Neuroimaging

## 2022-09-05 DIAGNOSIS — H538 Other visual disturbances: Secondary | ICD-10-CM

## 2022-09-05 DIAGNOSIS — Z0389 Encounter for observation for other suspected diseases and conditions ruled out: Secondary | ICD-10-CM | POA: Diagnosis not present

## 2022-09-05 DIAGNOSIS — H109 Unspecified conjunctivitis: Secondary | ICD-10-CM

## 2022-09-05 DIAGNOSIS — Q283 Other malformations of cerebral vessels: Secondary | ICD-10-CM

## 2022-09-05 MED ORDER — IOPAMIDOL (ISOVUE-370) INJECTION 76%
75.0000 mL | Freq: Once | INTRAVENOUS | Status: AC | PRN
  Administered 2022-09-05: 75 mL via INTRAVENOUS

## 2022-09-15 ENCOUNTER — Other Ambulatory Visit (HOSPITAL_COMMUNITY): Payer: Self-pay

## 2022-09-16 ENCOUNTER — Other Ambulatory Visit (HOSPITAL_COMMUNITY): Payer: Self-pay

## 2022-09-16 MED ORDER — ALPRAZOLAM 0.5 MG PO TABS
0.2500 mg | ORAL_TABLET | Freq: Every evening | ORAL | 0 refills | Status: DC | PRN
  Filled 2022-09-16: qty 30, 30d supply, fill #0

## 2022-09-16 MED ORDER — METRONIDAZOLE 0.75 % EX LOTN
TOPICAL_LOTION | CUTANEOUS | 0 refills | Status: DC
  Filled 2022-09-16: qty 177, 90d supply, fill #0

## 2022-09-19 ENCOUNTER — Other Ambulatory Visit (HOSPITAL_COMMUNITY): Payer: Self-pay

## 2022-09-19 DIAGNOSIS — I129 Hypertensive chronic kidney disease with stage 1 through stage 4 chronic kidney disease, or unspecified chronic kidney disease: Secondary | ICD-10-CM | POA: Diagnosis not present

## 2022-09-19 DIAGNOSIS — N182 Chronic kidney disease, stage 2 (mild): Secondary | ICD-10-CM | POA: Diagnosis not present

## 2022-09-19 DIAGNOSIS — E785 Hyperlipidemia, unspecified: Secondary | ICD-10-CM | POA: Diagnosis not present

## 2022-09-19 MED ORDER — LOSARTAN POTASSIUM 50 MG PO TABS
50.0000 mg | ORAL_TABLET | Freq: Every day | ORAL | 3 refills | Status: DC
  Filled 2022-09-19: qty 90, 90d supply, fill #0
  Filled 2023-01-08 – 2023-04-10 (×2): qty 90, 90d supply, fill #1
  Filled 2023-07-14: qty 90, 90d supply, fill #2

## 2022-09-21 ENCOUNTER — Other Ambulatory Visit (HOSPITAL_COMMUNITY): Payer: Self-pay

## 2022-09-21 MED ORDER — ALBUTEROL SULFATE HFA 108 (90 BASE) MCG/ACT IN AERS
2.0000 | INHALATION_SPRAY | RESPIRATORY_TRACT | 0 refills | Status: DC | PRN
  Filled 2022-09-21: qty 6.7, 30d supply, fill #0

## 2022-09-29 DIAGNOSIS — E559 Vitamin D deficiency, unspecified: Secondary | ICD-10-CM | POA: Diagnosis not present

## 2022-09-29 DIAGNOSIS — E785 Hyperlipidemia, unspecified: Secondary | ICD-10-CM | POA: Diagnosis not present

## 2022-09-29 DIAGNOSIS — R7301 Impaired fasting glucose: Secondary | ICD-10-CM | POA: Diagnosis not present

## 2022-09-29 DIAGNOSIS — E669 Obesity, unspecified: Secondary | ICD-10-CM | POA: Diagnosis not present

## 2022-09-29 DIAGNOSIS — E039 Hypothyroidism, unspecified: Secondary | ICD-10-CM | POA: Diagnosis not present

## 2022-09-29 DIAGNOSIS — Z6830 Body mass index (BMI) 30.0-30.9, adult: Secondary | ICD-10-CM | POA: Diagnosis not present

## 2022-09-29 DIAGNOSIS — R632 Polyphagia: Secondary | ICD-10-CM | POA: Diagnosis not present

## 2022-09-30 ENCOUNTER — Other Ambulatory Visit (HOSPITAL_COMMUNITY): Payer: Self-pay

## 2022-09-30 MED ORDER — WEGOVY 2.4 MG/0.75ML ~~LOC~~ SOAJ
2.4000 mg | SUBCUTANEOUS | 3 refills | Status: DC
  Filled 2022-09-30: qty 9, 84d supply, fill #0

## 2022-10-07 ENCOUNTER — Other Ambulatory Visit (HOSPITAL_COMMUNITY): Payer: Self-pay

## 2022-10-07 MED ORDER — TETANUS-DIPHTH-ACELL PERTUSSIS 5-2.5-18.5 LF-MCG/0.5 IM SUSY
0.5000 mL | PREFILLED_SYRINGE | INTRAMUSCULAR | 0 refills | Status: DC
  Filled 2022-10-07: qty 0.5, 1d supply, fill #0

## 2022-10-12 ENCOUNTER — Other Ambulatory Visit: Payer: Self-pay

## 2022-10-12 ENCOUNTER — Other Ambulatory Visit (HOSPITAL_COMMUNITY): Payer: Self-pay

## 2022-10-17 ENCOUNTER — Other Ambulatory Visit (HOSPITAL_COMMUNITY): Payer: Self-pay

## 2022-10-17 MED ORDER — ZEPBOUND 15 MG/0.5ML ~~LOC~~ SOAJ
15.0000 mg | SUBCUTANEOUS | 0 refills | Status: AC
  Filled 2022-10-17 – 2022-10-21 (×2): qty 6, 84d supply, fill #0

## 2022-10-17 MED ORDER — LEVOTHYROXINE SODIUM 88 MCG PO TABS
88.0000 ug | ORAL_TABLET | Freq: Every day | ORAL | 0 refills | Status: DC
  Filled 2022-10-17: qty 90, 90d supply, fill #0

## 2022-10-17 MED ORDER — POTASSIUM CHLORIDE CRYS ER 20 MEQ PO TBCR
20.0000 meq | EXTENDED_RELEASE_TABLET | Freq: Every day | ORAL | 0 refills | Status: DC
  Filled 2022-10-17: qty 90, 90d supply, fill #0

## 2022-10-19 ENCOUNTER — Other Ambulatory Visit (HOSPITAL_COMMUNITY): Payer: Self-pay

## 2022-10-21 ENCOUNTER — Other Ambulatory Visit (HOSPITAL_COMMUNITY): Payer: Self-pay

## 2022-10-25 ENCOUNTER — Other Ambulatory Visit (HOSPITAL_COMMUNITY): Payer: Self-pay

## 2022-10-25 MED ORDER — RSVPREF3 VAC RECOMB ADJUVANTED 120 MCG/0.5ML IM SUSR
0.5000 mL | Freq: Once | INTRAMUSCULAR | 0 refills | Status: AC
  Filled 2022-10-25: qty 0.5, 1d supply, fill #0

## 2022-10-26 ENCOUNTER — Other Ambulatory Visit (HOSPITAL_COMMUNITY): Payer: Self-pay

## 2022-11-08 ENCOUNTER — Other Ambulatory Visit (HOSPITAL_COMMUNITY): Payer: Self-pay

## 2022-11-08 MED ORDER — ALBUTEROL SULFATE HFA 108 (90 BASE) MCG/ACT IN AERS
2.0000 | INHALATION_SPRAY | RESPIRATORY_TRACT | 1 refills | Status: AC | PRN
  Filled 2022-11-08: qty 6.7, 30d supply, fill #0
  Filled 2023-08-29: qty 6.7, 30d supply, fill #1

## 2022-11-09 ENCOUNTER — Other Ambulatory Visit (HOSPITAL_COMMUNITY): Payer: Self-pay

## 2022-11-15 ENCOUNTER — Other Ambulatory Visit (HOSPITAL_COMMUNITY): Payer: Self-pay

## 2022-11-15 DIAGNOSIS — Z6830 Body mass index (BMI) 30.0-30.9, adult: Secondary | ICD-10-CM | POA: Diagnosis not present

## 2022-11-15 DIAGNOSIS — E669 Obesity, unspecified: Secondary | ICD-10-CM | POA: Diagnosis not present

## 2022-11-15 DIAGNOSIS — Z79899 Other long term (current) drug therapy: Secondary | ICD-10-CM | POA: Diagnosis not present

## 2022-11-15 DIAGNOSIS — I10 Essential (primary) hypertension: Secondary | ICD-10-CM | POA: Diagnosis not present

## 2022-11-15 DIAGNOSIS — E039 Hypothyroidism, unspecified: Secondary | ICD-10-CM | POA: Diagnosis not present

## 2022-11-15 MED ORDER — SCOPOLAMINE 1 MG/3DAYS TD PT72
1.0000 | MEDICATED_PATCH | TRANSDERMAL | 1 refills | Status: DC
  Filled 2022-11-15: qty 9, 27d supply, fill #0

## 2022-11-15 MED ORDER — ZEPBOUND 12.5 MG/0.5ML ~~LOC~~ SOAJ
12.5000 mg | SUBCUTANEOUS | 1 refills | Status: DC
  Filled 2022-11-15: qty 6, 84d supply, fill #0

## 2022-11-15 MED ORDER — ONDANSETRON 4 MG PO TBDP
4.0000 mg | ORAL_TABLET | Freq: Every day | ORAL | 0 refills | Status: AC
  Filled 2022-11-15: qty 30, 30d supply, fill #0

## 2022-11-15 MED ORDER — PREDNISONE 5 MG PO TABS
ORAL_TABLET | ORAL | 0 refills | Status: DC
  Filled 2022-11-15: qty 21, 6d supply, fill #0

## 2022-11-15 MED ORDER — AZITHROMYCIN 500 MG PO TABS
500.0000 mg | ORAL_TABLET | Freq: Every day | ORAL | 0 refills | Status: DC
  Filled 2022-11-15: qty 5, 5d supply, fill #0

## 2022-11-16 ENCOUNTER — Other Ambulatory Visit (HOSPITAL_COMMUNITY): Payer: Self-pay

## 2022-11-16 MED ORDER — AMOXICILLIN 500 MG PO CAPS
2000.0000 mg | ORAL_CAPSULE | ORAL | 0 refills | Status: DC
  Filled 2022-11-16: qty 12, 3d supply, fill #0

## 2022-11-17 ENCOUNTER — Other Ambulatory Visit (HOSPITAL_COMMUNITY): Payer: Self-pay

## 2022-11-22 ENCOUNTER — Other Ambulatory Visit (HOSPITAL_COMMUNITY): Payer: Self-pay

## 2022-11-22 MED ORDER — OMRON 3 SERIES BP MONITOR DEVI
0 refills | Status: DC
  Filled 2022-11-22: qty 1, 1d supply, fill #0

## 2022-12-15 ENCOUNTER — Other Ambulatory Visit (HOSPITAL_COMMUNITY): Payer: Self-pay

## 2023-01-09 ENCOUNTER — Other Ambulatory Visit: Payer: Self-pay | Admitting: Family Medicine

## 2023-01-09 ENCOUNTER — Other Ambulatory Visit (HOSPITAL_COMMUNITY): Payer: Self-pay

## 2023-01-09 DIAGNOSIS — Z1231 Encounter for screening mammogram for malignant neoplasm of breast: Secondary | ICD-10-CM

## 2023-01-09 MED ORDER — LOSARTAN POTASSIUM 50 MG PO TABS
50.0000 mg | ORAL_TABLET | Freq: Every day | ORAL | 3 refills | Status: DC
  Filled 2023-01-09: qty 90, 90d supply, fill #0
  Filled 2023-10-18: qty 90, 90d supply, fill #1

## 2023-01-09 MED ORDER — SIMVASTATIN 40 MG PO TABS
40.0000 mg | ORAL_TABLET | Freq: Every evening | ORAL | 3 refills | Status: DC
  Filled 2023-01-09: qty 90, 90d supply, fill #0
  Filled 2023-07-14: qty 90, 90d supply, fill #1
  Filled ????-??-??: fill #1

## 2023-01-09 MED ORDER — FAMOTIDINE 20 MG PO TABS
20.0000 mg | ORAL_TABLET | Freq: Two times a day (BID) | ORAL | 0 refills | Status: DC
  Filled 2023-01-09: qty 180, 90d supply, fill #0

## 2023-01-09 MED ORDER — HYDROCHLOROTHIAZIDE 12.5 MG PO CAPS
12.5000 mg | ORAL_CAPSULE | Freq: Every day | ORAL | 3 refills | Status: DC
  Filled 2023-01-09: qty 90, 90d supply, fill #0
  Filled 2023-04-10: qty 90, 90d supply, fill #1
  Filled 2023-07-14: qty 90, 90d supply, fill #2
  Filled 2023-10-18: qty 90, 90d supply, fill #3

## 2023-01-09 MED ORDER — LEVOTHYROXINE SODIUM 88 MCG PO TABS
88.0000 ug | ORAL_TABLET | Freq: Every day | ORAL | 3 refills | Status: DC
  Filled 2023-01-09: qty 90, 90d supply, fill #0
  Filled 2023-04-10: qty 90, 90d supply, fill #1
  Filled 2023-07-14: qty 90, 90d supply, fill #2
  Filled 2023-10-18: qty 90, 90d supply, fill #3

## 2023-01-10 DIAGNOSIS — Z6828 Body mass index (BMI) 28.0-28.9, adult: Secondary | ICD-10-CM | POA: Diagnosis not present

## 2023-01-10 DIAGNOSIS — I10 Essential (primary) hypertension: Secondary | ICD-10-CM | POA: Diagnosis not present

## 2023-01-10 DIAGNOSIS — E039 Hypothyroidism, unspecified: Secondary | ICD-10-CM | POA: Diagnosis not present

## 2023-01-10 DIAGNOSIS — E663 Overweight: Secondary | ICD-10-CM | POA: Diagnosis not present

## 2023-01-10 DIAGNOSIS — E785 Hyperlipidemia, unspecified: Secondary | ICD-10-CM | POA: Diagnosis not present

## 2023-01-10 DIAGNOSIS — R632 Polyphagia: Secondary | ICD-10-CM | POA: Diagnosis not present

## 2023-01-10 DIAGNOSIS — R7301 Impaired fasting glucose: Secondary | ICD-10-CM | POA: Diagnosis not present

## 2023-01-10 DIAGNOSIS — Z8639 Personal history of other endocrine, nutritional and metabolic disease: Secondary | ICD-10-CM | POA: Diagnosis not present

## 2023-01-23 ENCOUNTER — Other Ambulatory Visit (HOSPITAL_COMMUNITY): Payer: Self-pay

## 2023-01-23 DIAGNOSIS — L280 Lichen simplex chronicus: Secondary | ICD-10-CM | POA: Diagnosis not present

## 2023-01-23 DIAGNOSIS — L812 Freckles: Secondary | ICD-10-CM | POA: Diagnosis not present

## 2023-01-23 DIAGNOSIS — L718 Other rosacea: Secondary | ICD-10-CM | POA: Diagnosis not present

## 2023-01-23 DIAGNOSIS — D2271 Melanocytic nevi of right lower limb, including hip: Secondary | ICD-10-CM | POA: Diagnosis not present

## 2023-01-23 DIAGNOSIS — L570 Actinic keratosis: Secondary | ICD-10-CM | POA: Diagnosis not present

## 2023-01-23 DIAGNOSIS — D2272 Melanocytic nevi of left lower limb, including hip: Secondary | ICD-10-CM | POA: Diagnosis not present

## 2023-01-23 DIAGNOSIS — L821 Other seborrheic keratosis: Secondary | ICD-10-CM | POA: Diagnosis not present

## 2023-01-23 DIAGNOSIS — D1801 Hemangioma of skin and subcutaneous tissue: Secondary | ICD-10-CM | POA: Diagnosis not present

## 2023-01-23 DIAGNOSIS — D225 Melanocytic nevi of trunk: Secondary | ICD-10-CM | POA: Diagnosis not present

## 2023-01-23 DIAGNOSIS — D2262 Melanocytic nevi of left upper limb, including shoulder: Secondary | ICD-10-CM | POA: Diagnosis not present

## 2023-01-23 MED ORDER — AZELAIC ACID 15 % EX GEL
1.0000 | Freq: Two times a day (BID) | CUTANEOUS | 4 refills | Status: DC
  Filled 2023-01-23: qty 50, 25d supply, fill #0

## 2023-01-23 MED ORDER — CLOBETASOL PROPIONATE 0.05 % EX CREA
1.0000 | TOPICAL_CREAM | Freq: Two times a day (BID) | CUTANEOUS | 0 refills | Status: AC
  Filled 2023-01-23: qty 30, 15d supply, fill #0

## 2023-01-24 ENCOUNTER — Other Ambulatory Visit (HOSPITAL_COMMUNITY): Payer: Self-pay

## 2023-01-25 ENCOUNTER — Other Ambulatory Visit (HOSPITAL_COMMUNITY): Payer: Self-pay

## 2023-01-25 DIAGNOSIS — E669 Obesity, unspecified: Secondary | ICD-10-CM | POA: Diagnosis not present

## 2023-01-25 DIAGNOSIS — I10 Essential (primary) hypertension: Secondary | ICD-10-CM | POA: Diagnosis not present

## 2023-01-25 DIAGNOSIS — E785 Hyperlipidemia, unspecified: Secondary | ICD-10-CM | POA: Diagnosis not present

## 2023-01-25 DIAGNOSIS — Z87891 Personal history of nicotine dependence: Secondary | ICD-10-CM | POA: Diagnosis not present

## 2023-01-25 DIAGNOSIS — E039 Hypothyroidism, unspecified: Secondary | ICD-10-CM | POA: Diagnosis not present

## 2023-01-25 DIAGNOSIS — J4520 Mild intermittent asthma, uncomplicated: Secondary | ICD-10-CM | POA: Diagnosis not present

## 2023-01-25 DIAGNOSIS — G8929 Other chronic pain: Secondary | ICD-10-CM | POA: Diagnosis not present

## 2023-01-25 DIAGNOSIS — K219 Gastro-esophageal reflux disease without esophagitis: Secondary | ICD-10-CM | POA: Diagnosis not present

## 2023-01-25 DIAGNOSIS — M8580 Other specified disorders of bone density and structure, unspecified site: Secondary | ICD-10-CM | POA: Diagnosis not present

## 2023-01-25 DIAGNOSIS — G4700 Insomnia, unspecified: Secondary | ICD-10-CM | POA: Diagnosis not present

## 2023-02-07 ENCOUNTER — Other Ambulatory Visit (HOSPITAL_COMMUNITY): Payer: Self-pay

## 2023-02-07 MED ORDER — TRAZODONE HCL 50 MG PO TABS
50.0000 mg | ORAL_TABLET | Freq: Every day | ORAL | 1 refills | Status: DC
  Filled 2023-02-07: qty 90, 90d supply, fill #0
  Filled 2023-05-04: qty 90, 90d supply, fill #1

## 2023-02-08 ENCOUNTER — Ambulatory Visit
Admission: RE | Admit: 2023-02-08 | Discharge: 2023-02-08 | Disposition: A | Discharge status: Home / Self Care | Payer: PPO | Source: Ambulatory Visit | Attending: Pain Medicine | Admitting: Pain Medicine

## 2023-02-08 ENCOUNTER — Other Ambulatory Visit: Payer: Self-pay | Admitting: Pain Medicine

## 2023-02-08 DIAGNOSIS — M810 Age-related osteoporosis without current pathological fracture: Secondary | ICD-10-CM | POA: Diagnosis not present

## 2023-02-08 DIAGNOSIS — M25551 Pain in right hip: Secondary | ICD-10-CM | POA: Diagnosis not present

## 2023-02-08 DIAGNOSIS — Z0000 Encounter for general adult medical examination without abnormal findings: Secondary | ICD-10-CM | POA: Diagnosis not present

## 2023-02-08 DIAGNOSIS — Z23 Encounter for immunization: Secondary | ICD-10-CM | POA: Diagnosis not present

## 2023-02-09 ENCOUNTER — Other Ambulatory Visit: Payer: Self-pay | Admitting: Pain Medicine

## 2023-02-09 DIAGNOSIS — M8580 Other specified disorders of bone density and structure, unspecified site: Secondary | ICD-10-CM

## 2023-02-14 DIAGNOSIS — H40023 Open angle with borderline findings, high risk, bilateral: Secondary | ICD-10-CM | POA: Diagnosis not present

## 2023-02-27 ENCOUNTER — Ambulatory Visit
Admission: RE | Admit: 2023-02-27 | Discharge: 2023-02-27 | Disposition: A | Discharge status: Home / Self Care | Payer: PPO | Source: Ambulatory Visit | Attending: Family Medicine | Admitting: Family Medicine

## 2023-02-27 DIAGNOSIS — Z1231 Encounter for screening mammogram for malignant neoplasm of breast: Secondary | ICD-10-CM | POA: Diagnosis not present

## 2023-03-14 ENCOUNTER — Other Ambulatory Visit (HOSPITAL_BASED_OUTPATIENT_CLINIC_OR_DEPARTMENT_OTHER): Payer: Self-pay

## 2023-03-14 DIAGNOSIS — E663 Overweight: Secondary | ICD-10-CM | POA: Diagnosis not present

## 2023-03-14 DIAGNOSIS — Z8639 Personal history of other endocrine, nutritional and metabolic disease: Secondary | ICD-10-CM | POA: Diagnosis not present

## 2023-03-14 DIAGNOSIS — J45909 Unspecified asthma, uncomplicated: Secondary | ICD-10-CM | POA: Diagnosis not present

## 2023-03-14 DIAGNOSIS — Z6827 Body mass index (BMI) 27.0-27.9, adult: Secondary | ICD-10-CM | POA: Diagnosis not present

## 2023-03-14 DIAGNOSIS — E039 Hypothyroidism, unspecified: Secondary | ICD-10-CM | POA: Diagnosis not present

## 2023-03-14 DIAGNOSIS — I7090 Unspecified atherosclerosis: Secondary | ICD-10-CM | POA: Diagnosis not present

## 2023-03-14 MED ORDER — FLUTICASONE-SALMETEROL 250-50 MCG/ACT IN AEPB
1.0000 | INHALATION_SPRAY | Freq: Two times a day (BID) | RESPIRATORY_TRACT | 1 refills | Status: DC
  Filled 2023-03-14: qty 60, 30d supply, fill #0

## 2023-04-10 ENCOUNTER — Other Ambulatory Visit (HOSPITAL_BASED_OUTPATIENT_CLINIC_OR_DEPARTMENT_OTHER): Payer: Self-pay

## 2023-04-10 ENCOUNTER — Other Ambulatory Visit: Payer: Self-pay

## 2023-04-10 MED ORDER — FAMOTIDINE 20 MG PO TABS
20.0000 mg | ORAL_TABLET | Freq: Two times a day (BID) | ORAL | 0 refills | Status: DC
  Filled 2023-04-10: qty 180, 90d supply, fill #0

## 2023-06-05 DIAGNOSIS — E785 Hyperlipidemia, unspecified: Secondary | ICD-10-CM | POA: Diagnosis not present

## 2023-06-05 DIAGNOSIS — Z8639 Personal history of other endocrine, nutritional and metabolic disease: Secondary | ICD-10-CM | POA: Diagnosis not present

## 2023-06-05 DIAGNOSIS — I10 Essential (primary) hypertension: Secondary | ICD-10-CM | POA: Diagnosis not present

## 2023-06-05 DIAGNOSIS — I700 Atherosclerosis of aorta: Secondary | ICD-10-CM | POA: Diagnosis not present

## 2023-06-05 DIAGNOSIS — R632 Polyphagia: Secondary | ICD-10-CM | POA: Diagnosis not present

## 2023-06-05 DIAGNOSIS — Z6828 Body mass index (BMI) 28.0-28.9, adult: Secondary | ICD-10-CM | POA: Diagnosis not present

## 2023-06-05 DIAGNOSIS — R7301 Impaired fasting glucose: Secondary | ICD-10-CM | POA: Diagnosis not present

## 2023-06-05 DIAGNOSIS — E663 Overweight: Secondary | ICD-10-CM | POA: Diagnosis not present

## 2023-07-14 ENCOUNTER — Other Ambulatory Visit (HOSPITAL_BASED_OUTPATIENT_CLINIC_OR_DEPARTMENT_OTHER): Payer: Self-pay

## 2023-07-14 ENCOUNTER — Other Ambulatory Visit: Payer: Self-pay

## 2023-07-14 MED ORDER — FAMOTIDINE 20 MG PO TABS
20.0000 mg | ORAL_TABLET | Freq: Two times a day (BID) | ORAL | 0 refills | Status: DC
  Filled 2023-07-14: qty 180, 90d supply, fill #0

## 2023-07-14 MED ORDER — SIMVASTATIN 40 MG PO TABS
40.0000 mg | ORAL_TABLET | Freq: Every evening | ORAL | 0 refills | Status: DC
  Filled 2023-07-14 – 2023-07-19 (×2): qty 90, 90d supply, fill #0

## 2023-07-18 ENCOUNTER — Other Ambulatory Visit (HOSPITAL_BASED_OUTPATIENT_CLINIC_OR_DEPARTMENT_OTHER): Payer: Self-pay

## 2023-07-19 ENCOUNTER — Other Ambulatory Visit (HOSPITAL_BASED_OUTPATIENT_CLINIC_OR_DEPARTMENT_OTHER): Payer: Self-pay

## 2023-07-19 ENCOUNTER — Other Ambulatory Visit (HOSPITAL_COMMUNITY): Payer: Self-pay

## 2023-07-19 MED ORDER — COVID-19 MRNA VAC-TRIS(PFIZER) 30 MCG/0.3ML IM SUSY
0.3000 mL | PREFILLED_SYRINGE | Freq: Once | INTRAMUSCULAR | 0 refills | Status: AC
  Filled 2023-07-19: qty 0.3, 1d supply, fill #0

## 2023-07-19 MED ORDER — TRAZODONE HCL 50 MG PO TABS
50.0000 mg | ORAL_TABLET | Freq: Every day | ORAL | 1 refills | Status: DC
  Filled 2023-07-19: qty 90, 90d supply, fill #0
  Filled 2023-10-18: qty 90, 90d supply, fill #1

## 2023-07-28 ENCOUNTER — Other Ambulatory Visit (HOSPITAL_COMMUNITY): Payer: Self-pay

## 2023-07-28 MED ORDER — INFLUENZA VAC A&B SURF ANT ADJ 0.5 ML IM SUSY
0.5000 mL | PREFILLED_SYRINGE | Freq: Once | INTRAMUSCULAR | 0 refills | Status: AC
  Filled 2023-07-28: qty 0.5, 1d supply, fill #0

## 2023-08-22 ENCOUNTER — Ambulatory Visit
Admission: RE | Admit: 2023-08-22 | Discharge: 2023-08-22 | Disposition: A | Discharge status: Home / Self Care | Payer: PPO | Source: Ambulatory Visit | Attending: Pain Medicine | Admitting: Pain Medicine

## 2023-08-22 DIAGNOSIS — M8588 Other specified disorders of bone density and structure, other site: Secondary | ICD-10-CM | POA: Diagnosis not present

## 2023-08-22 DIAGNOSIS — Z780 Asymptomatic menopausal state: Secondary | ICD-10-CM

## 2023-08-22 DIAGNOSIS — N958 Other specified menopausal and perimenopausal disorders: Secondary | ICD-10-CM | POA: Diagnosis not present

## 2023-08-22 DIAGNOSIS — E2839 Other primary ovarian failure: Secondary | ICD-10-CM | POA: Diagnosis not present

## 2023-08-23 DIAGNOSIS — E039 Hypothyroidism, unspecified: Secondary | ICD-10-CM | POA: Diagnosis not present

## 2023-08-23 DIAGNOSIS — F5101 Primary insomnia: Secondary | ICD-10-CM | POA: Diagnosis not present

## 2023-08-23 DIAGNOSIS — K219 Gastro-esophageal reflux disease without esophagitis: Secondary | ICD-10-CM | POA: Diagnosis not present

## 2023-08-23 DIAGNOSIS — Z6830 Body mass index (BMI) 30.0-30.9, adult: Secondary | ICD-10-CM | POA: Diagnosis not present

## 2023-08-23 DIAGNOSIS — E7800 Pure hypercholesterolemia, unspecified: Secondary | ICD-10-CM | POA: Diagnosis not present

## 2023-08-23 DIAGNOSIS — E66811 Obesity, class 1: Secondary | ICD-10-CM | POA: Diagnosis not present

## 2023-08-23 DIAGNOSIS — I10 Essential (primary) hypertension: Secondary | ICD-10-CM | POA: Diagnosis not present

## 2023-08-23 DIAGNOSIS — R7303 Prediabetes: Secondary | ICD-10-CM | POA: Diagnosis not present

## 2023-08-23 DIAGNOSIS — N172 Acute kidney failure with medullary necrosis: Secondary | ICD-10-CM | POA: Diagnosis not present

## 2023-08-24 DIAGNOSIS — H25813 Combined forms of age-related cataract, bilateral: Secondary | ICD-10-CM | POA: Diagnosis not present

## 2023-08-24 DIAGNOSIS — H40023 Open angle with borderline findings, high risk, bilateral: Secondary | ICD-10-CM | POA: Diagnosis not present

## 2023-08-24 DIAGNOSIS — H524 Presbyopia: Secondary | ICD-10-CM | POA: Diagnosis not present

## 2023-08-24 DIAGNOSIS — E119 Type 2 diabetes mellitus without complications: Secondary | ICD-10-CM | POA: Diagnosis not present

## 2023-08-29 ENCOUNTER — Other Ambulatory Visit (HOSPITAL_BASED_OUTPATIENT_CLINIC_OR_DEPARTMENT_OTHER): Payer: Self-pay

## 2023-08-29 MED ORDER — ALENDRONATE SODIUM 70 MG PO TABS
70.0000 mg | ORAL_TABLET | ORAL | 3 refills | Status: DC
  Filled 2023-08-29: qty 12, 84d supply, fill #0

## 2023-08-30 ENCOUNTER — Other Ambulatory Visit (HOSPITAL_BASED_OUTPATIENT_CLINIC_OR_DEPARTMENT_OTHER): Payer: Self-pay

## 2023-09-05 DIAGNOSIS — I10 Essential (primary) hypertension: Secondary | ICD-10-CM | POA: Diagnosis not present

## 2023-09-05 DIAGNOSIS — E663 Overweight: Secondary | ICD-10-CM | POA: Diagnosis not present

## 2023-09-05 DIAGNOSIS — R7303 Prediabetes: Secondary | ICD-10-CM | POA: Diagnosis not present

## 2023-09-05 DIAGNOSIS — E785 Hyperlipidemia, unspecified: Secondary | ICD-10-CM | POA: Diagnosis not present

## 2023-09-05 DIAGNOSIS — Z8639 Personal history of other endocrine, nutritional and metabolic disease: Secondary | ICD-10-CM | POA: Diagnosis not present

## 2023-09-05 DIAGNOSIS — E039 Hypothyroidism, unspecified: Secondary | ICD-10-CM | POA: Diagnosis not present

## 2023-09-05 DIAGNOSIS — E559 Vitamin D deficiency, unspecified: Secondary | ICD-10-CM | POA: Diagnosis not present

## 2023-09-05 DIAGNOSIS — Z6829 Body mass index (BMI) 29.0-29.9, adult: Secondary | ICD-10-CM | POA: Diagnosis not present

## 2023-09-21 ENCOUNTER — Ambulatory Visit (HOSPITAL_BASED_OUTPATIENT_CLINIC_OR_DEPARTMENT_OTHER): Payer: 59 | Admitting: Obstetrics & Gynecology

## 2023-10-12 DIAGNOSIS — N182 Chronic kidney disease, stage 2 (mild): Secondary | ICD-10-CM | POA: Diagnosis not present

## 2023-10-12 DIAGNOSIS — N172 Acute kidney failure with medullary necrosis: Secondary | ICD-10-CM | POA: Diagnosis not present

## 2023-10-12 DIAGNOSIS — E785 Hyperlipidemia, unspecified: Secondary | ICD-10-CM | POA: Diagnosis not present

## 2023-10-12 DIAGNOSIS — I129 Hypertensive chronic kidney disease with stage 1 through stage 4 chronic kidney disease, or unspecified chronic kidney disease: Secondary | ICD-10-CM | POA: Diagnosis not present

## 2023-10-16 ENCOUNTER — Other Ambulatory Visit (HOSPITAL_BASED_OUTPATIENT_CLINIC_OR_DEPARTMENT_OTHER): Payer: Self-pay

## 2023-10-16 NOTE — Progress Notes (Signed)
67 y.o. G85P2002 Divorced White or Caucasian female here for breast and pelvic exam.  I am also following her for PMP status.  Doing well.  Did have BMD this fall.  Has osteopenia that has changed a little bit.  Her FRAX score was elevated.  Reviewed reasons for this.  She was prescribed fosamax but she is not really interested in watching.  She is taking calcium and vit D and is going to start doing weight bearing exercise.  Questions regarding this answered.    Denies vaginal bleeding.  Retired in January.  Feels like this was the best thing.    Patient's last menstrual period was 11/01/2003 (approximate).          Sexually active: No.  H/O STD:  no  Health Maintenance: PCP:  Dr. Hyacinth Meeker.  Last wellness appt was this fall.  Did blood work at that appt: yes Vaccines are up to date:  yes Colonoscopy:  12/28/2017, follow up 10 years MMG:  02/27/2023 Negative BMD:  08/22/2023 Last pap smear:  08/19/2021.   H/o abnormal pap smear:  remote hx with normal follow up    reports that she quit smoking about 24 years ago. Her smoking use included cigarettes. She started smoking about 49 years ago. She has never used smokeless tobacco. She reports current alcohol use of about 2.0 standard drinks of alcohol per week. She reports that she does not use drugs.  Past Medical History:  Diagnosis Date   Allergy    Anxiety    Aortic atherosclerosis (HCC)    Arthritis    Asthma    Depression    Eczema    Fatty liver    GERD (gastroesophageal reflux disease)    Glaucoma    Heart murmur    Hyperlipidemia    Hypertension    Hypertensive retinopathy    Hypothyroidism    Insomnia    Kidney problem    Obesity    Osteoarthritis    Osteopenia    Prediabetes    Rosacea    Thyroid disease    Vitamin D deficiency    Vocal cord nodules     Past Surgical History:  Procedure Laterality Date   CESAREAN SECTION     x 1   MICROLARYNGOSCOPY Left 11/06/2017   Procedure: MICRO DIRECT LARYNGOSCOPY REMOVAL  OF VOCAL CORD;  Surgeon: Flo Shanks, MD;  Location: [facility];  Service: ENT;  Laterality: Left;   THYROIDECTOMY, PARTIAL  1975   due to Grave's disease   TOTAL KNEE ARTHROPLASTY Left 03/09/2020   Procedure: TOTAL KNEE ARTHROPLASTY;  Surgeon: Ollen Gross, MD;  Location: [facility];  Service: Orthopedics;  Laterality: Left;    Current Outpatient Medications  Medication Sig Dispense Refill   albuterol (VENTOLIN HFA) 108 (90 Base) MCG/ACT inhaler Inhale 2 puffs into the lungs as needed. 6.7 g 1   ALPRAZolam (XANAX) 0.5 MG tablet Take 0.5-1 tablets (0.25-0.5 mg total) by mouth at bedtime as needed for sleep 30 tablet 0   ALPRAZolam (XANAX) 0.5 MG tablet Take 0.5-1 tablets (0.25-0.5 mg total) by mouth at bedtime as needed for sleep 30 tablet 0   amoxicillin (AMOXIL) 500 MG capsule Take 4 capsules (2,000 mg total) by mouth 1 hour prior to dental treatment 12 capsule 0   Azelaic Acid 15 % gel Apply a small amount to skin twice daily 50 g 4   clobetasol cream (TEMOVATE) 0.05 % Apply a small amount to affected area twice a day 30 g  0   COVID-19 mRNA vaccine 2023-2024 (COMIRNATY) syringe Inject into the muscle. 0.3 mL 0   fluticasone-salmeterol (ADVAIR) 250-50 MCG/ACT AEPB Inhale 1 puff into the lungs 2 (two) times daily. 60 each 1   hydrochlorothiazide (MICROZIDE) 12.5 MG capsule Take 1 capsule (12.5 mg total) by mouth daily. 90 capsule 3   levothyroxine (SYNTHROID) 88 MCG tablet Take 1 tablet (88 mcg total) by mouth daily. 90 tablet 3   losartan (COZAAR) 50 MG tablet Take 1 tablet (50 mg total) by mouth daily. 90 tablet 3   losartan (COZAAR) 50 MG tablet Take 1 tablet (50 mg total) by mouth daily. 90 tablet 3   METRONIDAZOLE, TOPICAL, 0.75 % LOTN APPLY AND GENTLY MASSAGE INTO AFFECTED AREA(S) OF FACE EXTERNALLY ONCE DAILY. 177 mL 0   neomycin-polymyxin-dexameth (MAXITROL) 0.1 % OINT Place 1 Application into both eyes as needed. 3.5 g 3   ondansetron (ZOFRAN-ODT) 4 MG disintegrating  tablet Take 1 tablet (4 mg total) on tongue and allow to dissolve once daily 30 tablet 0   Semaglutide-Weight Management (WEGOVY) 1.7 MG/0.75ML SOAJ Inject 1.7 mg into the skin once a week. 9 mL 0   simvastatin (ZOCOR) 40 MG tablet Take 1 tablet (40 mg total) by mouth every evening. 90 tablet 0   Tdap (BOOSTRIX) 5-2.5-18.5 LF-MCG/0.5 injection Inject 0.5 mLs into the muscle. 0.5 mL 0   tirzepatide (ZEPBOUND) 12.5 MG/0.5ML Pen Inject 12.5 mg into the skin once a week. 6 mL 1   tirzepatide (ZEPBOUND) 15 MG/0.5ML Pen Inject 15 mg into the skin once a week. 6 mL 0   traZODone (DESYREL) 50 MG tablet Take 1 tablet (50 mg total) by mouth at bedtime. 90 tablet 1   famotidine (PEPCID) 20 MG tablet Take 1 tablet (20 mg total) by mouth 2 (two) times daily. 180 tablet 0   No current facility-administered medications for this visit.    Family History  Problem Relation Age of Onset   Hypertension Mother    Hyperlipidemia Mother    Diabetes Mother    Thyroid disease Mother    Hypertension Father    Heart disease Father        CHF   Colon cancer Father 57       stage 4   Kidney disease Father    Sleep apnea Father    Obesity Father    Thyroid disease Daughter    Esophageal cancer Neg Hx    Rectal cancer Neg Hx    Liver cancer Neg Hx    Pancreatic cancer Neg Hx    Stomach cancer Neg Hx    Breast cancer Neg Hx     Review of Systems  Constitutional: Negative.   Genitourinary: Negative.     Exam:   BP 108/61 (BP Location: Right Arm, Patient Position: Sitting, Cuff Size: Large)   Pulse 82   Ht 5' 3.25" (1.607 m)   Wt 172 lb 6.4 oz (78.2 kg)   LMP 11/01/2003 (Approximate)   BMI 30.30 kg/m   Height: 5' 3.25" (160.7 cm)  General appearance: alert, cooperative and appears stated age Breasts: normal appearance, no masses or tenderness Abdomen: soft, non-tender; bowel sounds normal; no masses,  no organomegaly Lymph nodes: Cervical, supraclavicular, and axillary nodes normal.  No abnormal  inguinal nodes palpated Neurologic: Grossly normal  Pelvic: External genitalia:  no lesions              Urethra:  normal appearing urethra with no masses, tenderness or lesions  Bartholins and Skenes: normal                 Vagina: normal appearing vagina with atrophic changes and no discharge, no lesions              Cervix: no lesions              Pap taken: No. Bimanual Exam:  Uterus:  normal size, contour, position, consistency, mobility, non-tender              Adnexa: normal adnexa and no mass, fullness, tenderness               Rectovaginal: Confirms               Anus:  normal sphincter tone, no lesions  Chaperone, Ina Homes, CMA, was present for exam.  Assessment/Plan: 1. Encounter for routine gynecologic examination in Medicare patient (Primary) - Pap smear 08/19/2021.  Pt comfortable with stopping pap smears.  - Mammogram 4/29/20224 - Colonoscopy 12/28/2017, follow up 10 years - Bone mineral density 08/22/2023 - lab work done with Dr. Sigmund Hazel - vaccines reviewed/updated  2. Postmenopausal - not on HRT  3. Osteopenia of both hips - she desires to not start treatment at this time.  Will discussed with her PCP and repeat testing in two years - adequate calcium/Vit D reviewed  4. Vitamin D deficiency - most recent blood work was normal

## 2023-10-17 ENCOUNTER — Encounter (HOSPITAL_BASED_OUTPATIENT_CLINIC_OR_DEPARTMENT_OTHER): Payer: Self-pay | Admitting: Obstetrics & Gynecology

## 2023-10-17 ENCOUNTER — Ambulatory Visit (HOSPITAL_BASED_OUTPATIENT_CLINIC_OR_DEPARTMENT_OTHER): Payer: PPO | Admitting: Obstetrics & Gynecology

## 2023-10-17 ENCOUNTER — Encounter (HOSPITAL_BASED_OUTPATIENT_CLINIC_OR_DEPARTMENT_OTHER): Payer: Self-pay

## 2023-10-17 ENCOUNTER — Other Ambulatory Visit (HOSPITAL_BASED_OUTPATIENT_CLINIC_OR_DEPARTMENT_OTHER): Payer: Self-pay

## 2023-10-17 VITALS — BP 108/61 | HR 82 | Ht 63.25 in | Wt 172.4 lb

## 2023-10-17 DIAGNOSIS — M85851 Other specified disorders of bone density and structure, right thigh: Secondary | ICD-10-CM

## 2023-10-17 DIAGNOSIS — Z780 Asymptomatic menopausal state: Secondary | ICD-10-CM | POA: Diagnosis not present

## 2023-10-17 DIAGNOSIS — Z01419 Encounter for gynecological examination (general) (routine) without abnormal findings: Secondary | ICD-10-CM | POA: Diagnosis not present

## 2023-10-17 DIAGNOSIS — M85852 Other specified disorders of bone density and structure, left thigh: Secondary | ICD-10-CM

## 2023-10-17 DIAGNOSIS — E559 Vitamin D deficiency, unspecified: Secondary | ICD-10-CM | POA: Diagnosis not present

## 2023-10-17 MED ORDER — FAMOTIDINE 20 MG PO TABS
20.0000 mg | ORAL_TABLET | Freq: Two times a day (BID) | ORAL | 0 refills | Status: DC
  Filled 2023-10-17: qty 180, 90d supply, fill #0

## 2023-10-18 ENCOUNTER — Other Ambulatory Visit: Payer: Self-pay

## 2023-10-18 ENCOUNTER — Other Ambulatory Visit (HOSPITAL_BASED_OUTPATIENT_CLINIC_OR_DEPARTMENT_OTHER): Payer: Self-pay

## 2023-10-18 MED ORDER — SIMVASTATIN 40 MG PO TABS
40.0000 mg | ORAL_TABLET | Freq: Every day | ORAL | 2 refills | Status: DC
  Filled 2023-10-18: qty 90, 90d supply, fill #0
  Filled 2024-01-12 – 2024-01-13 (×2): qty 90, 90d supply, fill #1
  Filled 2024-04-11: qty 90, 90d supply, fill #2

## 2023-10-20 DIAGNOSIS — M85851 Other specified disorders of bone density and structure, right thigh: Secondary | ICD-10-CM | POA: Insufficient documentation

## 2023-11-01 DIAGNOSIS — C439 Malignant melanoma of skin, unspecified: Secondary | ICD-10-CM

## 2023-11-01 HISTORY — DX: Malignant melanoma of skin, unspecified: C43.9

## 2023-12-06 ENCOUNTER — Other Ambulatory Visit (HOSPITAL_BASED_OUTPATIENT_CLINIC_OR_DEPARTMENT_OTHER): Payer: Self-pay

## 2023-12-06 MED ORDER — AMOXICILLIN 500 MG PO CAPS
2000.0000 mg | ORAL_CAPSULE | Freq: Once | ORAL | 0 refills | Status: AC
  Filled 2023-12-06: qty 16, 4d supply, fill #0

## 2024-01-12 ENCOUNTER — Other Ambulatory Visit: Payer: Self-pay

## 2024-01-12 ENCOUNTER — Other Ambulatory Visit (HOSPITAL_BASED_OUTPATIENT_CLINIC_OR_DEPARTMENT_OTHER): Payer: Self-pay

## 2024-01-12 MED ORDER — FAMOTIDINE 20 MG PO TABS
20.0000 mg | ORAL_TABLET | Freq: Two times a day (BID) | ORAL | 3 refills | Status: AC
  Filled 2024-01-12 – 2024-01-13 (×2): qty 180, 90d supply, fill #0
  Filled 2024-04-11: qty 180, 90d supply, fill #1
  Filled 2024-07-11: qty 180, 90d supply, fill #2
  Filled 2024-10-13: qty 180, 90d supply, fill #3

## 2024-01-12 MED ORDER — HYDROCHLOROTHIAZIDE 12.5 MG PO CAPS
12.5000 mg | ORAL_CAPSULE | Freq: Every morning | ORAL | 3 refills | Status: AC
  Filled 2024-01-12: qty 90, 90d supply, fill #0
  Filled 2024-04-11: qty 90, 90d supply, fill #1
  Filled 2024-07-11: qty 90, 90d supply, fill #2
  Filled 2024-10-13: qty 90, 90d supply, fill #3

## 2024-01-12 MED ORDER — LEVOTHYROXINE SODIUM 88 MCG PO TABS
88.0000 ug | ORAL_TABLET | Freq: Every morning | ORAL | 3 refills | Status: AC
  Filled 2024-01-12: qty 90, 90d supply, fill #0
  Filled 2024-04-11: qty 90, 90d supply, fill #1
  Filled 2024-07-11: qty 90, 90d supply, fill #2
  Filled 2024-10-13: qty 90, 90d supply, fill #3

## 2024-01-12 MED ORDER — LOSARTAN POTASSIUM 50 MG PO TABS
50.0000 mg | ORAL_TABLET | Freq: Every day | ORAL | 3 refills | Status: AC
  Filled 2024-01-12: qty 90, 90d supply, fill #0
  Filled 2024-04-11: qty 90, 90d supply, fill #1
  Filled 2024-07-11: qty 90, 90d supply, fill #2
  Filled 2024-10-13: qty 90, 90d supply, fill #3

## 2024-01-12 MED ORDER — TRAZODONE HCL 50 MG PO TABS
50.0000 mg | ORAL_TABLET | Freq: Every evening | ORAL | 3 refills | Status: AC | PRN
  Filled 2024-01-12: qty 90, 90d supply, fill #0
  Filled 2024-04-11: qty 90, 90d supply, fill #1
  Filled 2024-07-11: qty 90, 90d supply, fill #2
  Filled 2024-10-13: qty 90, 90d supply, fill #3

## 2024-01-13 ENCOUNTER — Other Ambulatory Visit (HOSPITAL_BASED_OUTPATIENT_CLINIC_OR_DEPARTMENT_OTHER): Payer: Self-pay

## 2024-01-15 ENCOUNTER — Other Ambulatory Visit: Payer: Self-pay

## 2024-01-22 ENCOUNTER — Other Ambulatory Visit: Payer: Self-pay | Admitting: Family Medicine

## 2024-01-22 DIAGNOSIS — Z1231 Encounter for screening mammogram for malignant neoplasm of breast: Secondary | ICD-10-CM

## 2024-02-07 ENCOUNTER — Other Ambulatory Visit (HOSPITAL_BASED_OUTPATIENT_CLINIC_OR_DEPARTMENT_OTHER): Payer: Self-pay

## 2024-02-07 DIAGNOSIS — L821 Other seborrheic keratosis: Secondary | ICD-10-CM | POA: Diagnosis not present

## 2024-02-07 DIAGNOSIS — L820 Inflamed seborrheic keratosis: Secondary | ICD-10-CM | POA: Diagnosis not present

## 2024-02-07 DIAGNOSIS — L718 Other rosacea: Secondary | ICD-10-CM | POA: Diagnosis not present

## 2024-02-07 DIAGNOSIS — D1801 Hemangioma of skin and subcutaneous tissue: Secondary | ICD-10-CM | POA: Diagnosis not present

## 2024-02-07 DIAGNOSIS — D485 Neoplasm of uncertain behavior of skin: Secondary | ICD-10-CM | POA: Diagnosis not present

## 2024-02-07 DIAGNOSIS — C4339 Malignant melanoma of other parts of face: Secondary | ICD-10-CM | POA: Diagnosis not present

## 2024-02-07 DIAGNOSIS — L812 Freckles: Secondary | ICD-10-CM | POA: Diagnosis not present

## 2024-02-07 DIAGNOSIS — D225 Melanocytic nevi of trunk: Secondary | ICD-10-CM | POA: Diagnosis not present

## 2024-02-07 MED ORDER — METRONIDAZOLE 0.75 % EX CREA
TOPICAL_CREAM | CUTANEOUS | 2 refills | Status: AC
  Filled 2024-02-07: qty 45, 30d supply, fill #0

## 2024-02-12 ENCOUNTER — Other Ambulatory Visit: Payer: Self-pay

## 2024-02-12 ENCOUNTER — Other Ambulatory Visit (HOSPITAL_BASED_OUTPATIENT_CLINIC_OR_DEPARTMENT_OTHER): Payer: Self-pay

## 2024-02-12 DIAGNOSIS — I10 Essential (primary) hypertension: Secondary | ICD-10-CM | POA: Diagnosis not present

## 2024-02-12 DIAGNOSIS — E66811 Obesity, class 1: Secondary | ICD-10-CM | POA: Diagnosis not present

## 2024-02-12 DIAGNOSIS — F5101 Primary insomnia: Secondary | ICD-10-CM | POA: Diagnosis not present

## 2024-02-12 DIAGNOSIS — M8588 Other specified disorders of bone density and structure, other site: Secondary | ICD-10-CM | POA: Diagnosis not present

## 2024-02-12 DIAGNOSIS — E7800 Pure hypercholesterolemia, unspecified: Secondary | ICD-10-CM | POA: Diagnosis not present

## 2024-02-12 DIAGNOSIS — Z23 Encounter for immunization: Secondary | ICD-10-CM | POA: Diagnosis not present

## 2024-02-12 MED ORDER — LORAZEPAM 0.5 MG PO TABS
0.5000 mg | ORAL_TABLET | Freq: Every day | ORAL | 0 refills | Status: AC | PRN
  Filled 2024-02-12: qty 30, 30d supply, fill #0

## 2024-02-16 ENCOUNTER — Other Ambulatory Visit (HOSPITAL_BASED_OUTPATIENT_CLINIC_OR_DEPARTMENT_OTHER): Payer: Self-pay

## 2024-02-16 MED ORDER — DIAZEPAM 5 MG PO TABS
5.0000 mg | ORAL_TABLET | ORAL | 0 refills | Status: DC
  Filled 2024-02-16: qty 2, 1d supply, fill #0

## 2024-02-21 ENCOUNTER — Other Ambulatory Visit (HOSPITAL_BASED_OUTPATIENT_CLINIC_OR_DEPARTMENT_OTHER): Payer: Self-pay

## 2024-02-26 DIAGNOSIS — H40023 Open angle with borderline findings, high risk, bilateral: Secondary | ICD-10-CM | POA: Diagnosis not present

## 2024-02-28 ENCOUNTER — Ambulatory Visit
Admission: RE | Admit: 2024-02-28 | Discharge: 2024-02-28 | Disposition: A | Discharge status: Home / Self Care | Source: Ambulatory Visit | Attending: Family Medicine | Admitting: Family Medicine

## 2024-02-28 DIAGNOSIS — Z85820 Personal history of malignant melanoma of skin: Secondary | ICD-10-CM | POA: Diagnosis not present

## 2024-02-28 DIAGNOSIS — C4339 Malignant melanoma of other parts of face: Secondary | ICD-10-CM | POA: Diagnosis not present

## 2024-02-28 DIAGNOSIS — Z1231 Encounter for screening mammogram for malignant neoplasm of breast: Secondary | ICD-10-CM

## 2024-03-01 ENCOUNTER — Other Ambulatory Visit (HOSPITAL_BASED_OUTPATIENT_CLINIC_OR_DEPARTMENT_OTHER): Payer: Self-pay

## 2024-03-01 DIAGNOSIS — I10 Essential (primary) hypertension: Secondary | ICD-10-CM | POA: Diagnosis not present

## 2024-03-01 DIAGNOSIS — R7303 Prediabetes: Secondary | ICD-10-CM | POA: Diagnosis not present

## 2024-03-01 DIAGNOSIS — R632 Polyphagia: Secondary | ICD-10-CM | POA: Diagnosis not present

## 2024-03-01 DIAGNOSIS — Z8639 Personal history of other endocrine, nutritional and metabolic disease: Secondary | ICD-10-CM | POA: Diagnosis not present

## 2024-03-01 DIAGNOSIS — E663 Overweight: Secondary | ICD-10-CM | POA: Diagnosis not present

## 2024-03-01 DIAGNOSIS — Z6829 Body mass index (BMI) 29.0-29.9, adult: Secondary | ICD-10-CM | POA: Diagnosis not present

## 2024-03-01 MED ORDER — WEGOVY 2.4 MG/0.75ML ~~LOC~~ SOAJ
2.4000 mg | SUBCUTANEOUS | 6 refills | Status: DC
  Filled 2024-03-01 – 2024-04-21 (×2): qty 3, 28d supply, fill #0

## 2024-03-04 ENCOUNTER — Other Ambulatory Visit (HOSPITAL_BASED_OUTPATIENT_CLINIC_OR_DEPARTMENT_OTHER): Payer: Self-pay

## 2024-03-05 DIAGNOSIS — Z9889 Other specified postprocedural states: Secondary | ICD-10-CM | POA: Diagnosis not present

## 2024-03-05 DIAGNOSIS — M952 Other acquired deformity of head: Secondary | ICD-10-CM | POA: Diagnosis not present

## 2024-03-05 DIAGNOSIS — Z9089 Acquired absence of other organs: Secondary | ICD-10-CM | POA: Diagnosis not present

## 2024-03-05 DIAGNOSIS — C4330 Malignant melanoma of unspecified part of face: Secondary | ICD-10-CM | POA: Diagnosis not present

## 2024-03-05 DIAGNOSIS — L910 Hypertrophic scar: Secondary | ICD-10-CM | POA: Diagnosis not present

## 2024-03-31 HISTORY — PX: MOHS SURGERY: SUR867

## 2024-03-31 HISTORY — PX: OTHER SURGICAL HISTORY: SHX169

## 2024-04-01 ENCOUNTER — Other Ambulatory Visit (HOSPITAL_BASED_OUTPATIENT_CLINIC_OR_DEPARTMENT_OTHER): Payer: Self-pay

## 2024-04-01 DIAGNOSIS — L814 Other melanin hyperpigmentation: Secondary | ICD-10-CM | POA: Diagnosis not present

## 2024-04-01 DIAGNOSIS — Z85820 Personal history of malignant melanoma of skin: Secondary | ICD-10-CM | POA: Diagnosis not present

## 2024-04-01 DIAGNOSIS — D0339 Melanoma in situ of other parts of face: Secondary | ICD-10-CM | POA: Diagnosis not present

## 2024-04-01 DIAGNOSIS — L988 Other specified disorders of the skin and subcutaneous tissue: Secondary | ICD-10-CM | POA: Diagnosis not present

## 2024-04-01 DIAGNOSIS — C4339 Malignant melanoma of other parts of face: Secondary | ICD-10-CM | POA: Diagnosis not present

## 2024-04-01 MED ORDER — DOXYCYCLINE HYCLATE 100 MG PO CAPS
100.0000 mg | ORAL_CAPSULE | Freq: Two times a day (BID) | ORAL | 0 refills | Status: DC
  Filled 2024-04-01: qty 10, 5d supply, fill #0

## 2024-04-02 DIAGNOSIS — C4339 Malignant melanoma of other parts of face: Secondary | ICD-10-CM | POA: Diagnosis not present

## 2024-04-02 DIAGNOSIS — L988 Other specified disorders of the skin and subcutaneous tissue: Secondary | ICD-10-CM | POA: Diagnosis not present

## 2024-04-04 ENCOUNTER — Other Ambulatory Visit (HOSPITAL_BASED_OUTPATIENT_CLINIC_OR_DEPARTMENT_OTHER): Payer: Self-pay

## 2024-04-04 DIAGNOSIS — C4330 Malignant melanoma of unspecified part of face: Secondary | ICD-10-CM | POA: Diagnosis not present

## 2024-04-04 DIAGNOSIS — Z483 Aftercare following surgery for neoplasm: Secondary | ICD-10-CM | POA: Diagnosis not present

## 2024-04-04 DIAGNOSIS — M952 Other acquired deformity of head: Secondary | ICD-10-CM | POA: Diagnosis not present

## 2024-04-04 DIAGNOSIS — Z481 Encounter for planned postprocedural wound closure: Secondary | ICD-10-CM | POA: Diagnosis not present

## 2024-04-04 DIAGNOSIS — Z85820 Personal history of malignant melanoma of skin: Secondary | ICD-10-CM | POA: Diagnosis not present

## 2024-04-04 MED ORDER — HYDROCODONE-ACETAMINOPHEN 5-325 MG PO TABS
1.0000 | ORAL_TABLET | Freq: Four times a day (QID) | ORAL | 0 refills | Status: DC | PRN
  Filled 2024-04-04: qty 10, 3d supply, fill #0

## 2024-04-22 ENCOUNTER — Other Ambulatory Visit (HOSPITAL_BASED_OUTPATIENT_CLINIC_OR_DEPARTMENT_OTHER): Payer: Self-pay

## 2024-04-23 ENCOUNTER — Other Ambulatory Visit (HOSPITAL_BASED_OUTPATIENT_CLINIC_OR_DEPARTMENT_OTHER): Payer: Self-pay

## 2024-04-25 ENCOUNTER — Other Ambulatory Visit (HOSPITAL_BASED_OUTPATIENT_CLINIC_OR_DEPARTMENT_OTHER): Payer: Self-pay

## 2024-04-29 ENCOUNTER — Other Ambulatory Visit (HOSPITAL_BASED_OUTPATIENT_CLINIC_OR_DEPARTMENT_OTHER): Payer: Self-pay

## 2024-05-02 ENCOUNTER — Other Ambulatory Visit (HOSPITAL_BASED_OUTPATIENT_CLINIC_OR_DEPARTMENT_OTHER): Payer: Self-pay

## 2024-06-11 DIAGNOSIS — I10 Essential (primary) hypertension: Secondary | ICD-10-CM | POA: Diagnosis not present

## 2024-06-11 DIAGNOSIS — R632 Polyphagia: Secondary | ICD-10-CM | POA: Diagnosis not present

## 2024-06-11 DIAGNOSIS — Z8639 Personal history of other endocrine, nutritional and metabolic disease: Secondary | ICD-10-CM | POA: Diagnosis not present

## 2024-06-11 DIAGNOSIS — R7303 Prediabetes: Secondary | ICD-10-CM | POA: Diagnosis not present

## 2024-06-11 DIAGNOSIS — Z6829 Body mass index (BMI) 29.0-29.9, adult: Secondary | ICD-10-CM | POA: Diagnosis not present

## 2024-06-11 DIAGNOSIS — E663 Overweight: Secondary | ICD-10-CM | POA: Diagnosis not present

## 2024-07-02 DIAGNOSIS — L570 Actinic keratosis: Secondary | ICD-10-CM | POA: Diagnosis not present

## 2024-07-02 DIAGNOSIS — L812 Freckles: Secondary | ICD-10-CM | POA: Diagnosis not present

## 2024-07-02 DIAGNOSIS — D225 Melanocytic nevi of trunk: Secondary | ICD-10-CM | POA: Diagnosis not present

## 2024-07-02 DIAGNOSIS — Z85820 Personal history of malignant melanoma of skin: Secondary | ICD-10-CM | POA: Diagnosis not present

## 2024-07-02 DIAGNOSIS — L821 Other seborrheic keratosis: Secondary | ICD-10-CM | POA: Diagnosis not present

## 2024-07-11 ENCOUNTER — Other Ambulatory Visit: Payer: Self-pay

## 2024-07-11 ENCOUNTER — Other Ambulatory Visit (HOSPITAL_BASED_OUTPATIENT_CLINIC_OR_DEPARTMENT_OTHER): Payer: Self-pay

## 2024-07-11 MED ORDER — SIMVASTATIN 40 MG PO TABS
40.0000 mg | ORAL_TABLET | Freq: Every evening | ORAL | 0 refills | Status: DC
  Filled 2024-07-11: qty 90, 90d supply, fill #0

## 2024-07-22 DIAGNOSIS — R632 Polyphagia: Secondary | ICD-10-CM | POA: Diagnosis not present

## 2024-07-22 DIAGNOSIS — E663 Overweight: Secondary | ICD-10-CM | POA: Diagnosis not present

## 2024-07-22 DIAGNOSIS — Z8639 Personal history of other endocrine, nutritional and metabolic disease: Secondary | ICD-10-CM | POA: Diagnosis not present

## 2024-07-22 DIAGNOSIS — Z6829 Body mass index (BMI) 29.0-29.9, adult: Secondary | ICD-10-CM | POA: Diagnosis not present

## 2024-07-22 DIAGNOSIS — R7303 Prediabetes: Secondary | ICD-10-CM | POA: Diagnosis not present

## 2024-07-22 DIAGNOSIS — I10 Essential (primary) hypertension: Secondary | ICD-10-CM | POA: Diagnosis not present

## 2024-08-02 DIAGNOSIS — C4330 Malignant melanoma of unspecified part of face: Secondary | ICD-10-CM | POA: Diagnosis not present

## 2024-08-02 DIAGNOSIS — M952 Other acquired deformity of head: Secondary | ICD-10-CM | POA: Diagnosis not present

## 2024-08-12 DIAGNOSIS — E7800 Pure hypercholesterolemia, unspecified: Secondary | ICD-10-CM | POA: Diagnosis not present

## 2024-08-12 DIAGNOSIS — E039 Hypothyroidism, unspecified: Secondary | ICD-10-CM | POA: Diagnosis not present

## 2024-08-15 DIAGNOSIS — L719 Rosacea, unspecified: Secondary | ICD-10-CM | POA: Diagnosis not present

## 2024-08-15 DIAGNOSIS — Z85820 Personal history of malignant melanoma of skin: Secondary | ICD-10-CM | POA: Diagnosis not present

## 2024-08-15 DIAGNOSIS — E7800 Pure hypercholesterolemia, unspecified: Secondary | ICD-10-CM | POA: Diagnosis not present

## 2024-08-15 DIAGNOSIS — M8588 Other specified disorders of bone density and structure, other site: Secondary | ICD-10-CM | POA: Diagnosis not present

## 2024-08-15 DIAGNOSIS — Z0000 Encounter for general adult medical examination without abnormal findings: Secondary | ICD-10-CM | POA: Diagnosis not present

## 2024-08-15 DIAGNOSIS — I10 Essential (primary) hypertension: Secondary | ICD-10-CM | POA: Diagnosis not present

## 2024-08-15 DIAGNOSIS — E66811 Obesity, class 1: Secondary | ICD-10-CM | POA: Diagnosis not present

## 2024-08-15 DIAGNOSIS — F5101 Primary insomnia: Secondary | ICD-10-CM | POA: Diagnosis not present

## 2024-08-15 DIAGNOSIS — K219 Gastro-esophageal reflux disease without esophagitis: Secondary | ICD-10-CM | POA: Diagnosis not present

## 2024-08-15 DIAGNOSIS — E039 Hypothyroidism, unspecified: Secondary | ICD-10-CM | POA: Diagnosis not present

## 2024-08-15 DIAGNOSIS — Z23 Encounter for immunization: Secondary | ICD-10-CM | POA: Diagnosis not present

## 2024-09-02 DIAGNOSIS — Z8639 Personal history of other endocrine, nutritional and metabolic disease: Secondary | ICD-10-CM | POA: Diagnosis not present

## 2024-09-02 DIAGNOSIS — Z6828 Body mass index (BMI) 28.0-28.9, adult: Secondary | ICD-10-CM | POA: Diagnosis not present

## 2024-09-02 DIAGNOSIS — E663 Overweight: Secondary | ICD-10-CM | POA: Diagnosis not present

## 2024-09-02 DIAGNOSIS — R7303 Prediabetes: Secondary | ICD-10-CM | POA: Diagnosis not present

## 2024-09-02 DIAGNOSIS — R632 Polyphagia: Secondary | ICD-10-CM | POA: Diagnosis not present

## 2024-09-02 DIAGNOSIS — R197 Diarrhea, unspecified: Secondary | ICD-10-CM | POA: Diagnosis not present

## 2024-09-02 DIAGNOSIS — I10 Essential (primary) hypertension: Secondary | ICD-10-CM | POA: Diagnosis not present

## 2024-09-09 DIAGNOSIS — H524 Presbyopia: Secondary | ICD-10-CM | POA: Diagnosis not present

## 2024-09-09 DIAGNOSIS — H25813 Combined forms of age-related cataract, bilateral: Secondary | ICD-10-CM | POA: Diagnosis not present

## 2024-09-09 DIAGNOSIS — H40023 Open angle with borderline findings, high risk, bilateral: Secondary | ICD-10-CM | POA: Diagnosis not present

## 2024-09-09 DIAGNOSIS — E119 Type 2 diabetes mellitus without complications: Secondary | ICD-10-CM | POA: Diagnosis not present

## 2024-10-13 ENCOUNTER — Other Ambulatory Visit (HOSPITAL_BASED_OUTPATIENT_CLINIC_OR_DEPARTMENT_OTHER): Payer: Self-pay

## 2024-10-14 ENCOUNTER — Other Ambulatory Visit: Payer: Self-pay

## 2024-10-14 ENCOUNTER — Other Ambulatory Visit (HOSPITAL_BASED_OUTPATIENT_CLINIC_OR_DEPARTMENT_OTHER): Payer: Self-pay

## 2024-10-14 MED ORDER — SIMVASTATIN 40 MG PO TABS
40.0000 mg | ORAL_TABLET | Freq: Every evening | ORAL | 1 refills | Status: AC
  Filled 2024-10-14: qty 90, 90d supply, fill #0

## 2024-10-21 ENCOUNTER — Ambulatory Visit (HOSPITAL_BASED_OUTPATIENT_CLINIC_OR_DEPARTMENT_OTHER): Payer: PPO | Admitting: Obstetrics & Gynecology

## 2024-11-19 NOTE — Progress Notes (Signed)
 "  Breast and Pelvic Patient name: Morgan Williams MRN [id number]  Date of birth: 08-Oct-1956 Chief Complaint:   Annual Exam  History of Present Illness:   Morgan Williams is a 69 y.o. G58P2002 Caucasian female being seen today for a breast and pelvic.  Had a facial malenoma that was 2 inches. This was closed by Dr. Luciano, facial plastics.  Denies vaginal bleeding.  Patient's last menstrual period was 11/01/2003.  Last pap 08/19/2021. Results were: NILM w/ HRHPV negative. H/O abnormal pap: yes Last mammogram: 02/28/2024. Results were: normal. Family h/o breast cancer: no Last colonoscopy: 12/28/2017. Results were: normal. Family h/o colorectal cancer: yes father DEXA:  08/2023.  T score -2.0.       11/20/2024   10:09 AM 11/20/2024   10:08 AM 10/17/2023    1:30 PM 09/01/2022    9:09 AM 09/20/2018    8:27 AM  Depression screen PHQ 2/9  Decreased Interest 1 0 0 0 1  Down, Depressed, Hopeless 0 0 0 0 1  PHQ - 2 Score 1 0 0 0 2  Altered sleeping 0    3  Tired, decreased energy 0    3  Change in appetite 0    1  Feeling bad or failure about yourself  0    1  Trouble concentrating 0    0  Moving slowly or fidgety/restless 0      Suicidal thoughts 0    0  PHQ-9 Score 1    10   Difficult doing work/chores Not difficult at all    Not difficult at all     Data saved with a previous flowsheet row definition        11/20/2024   10:09 AM  GAD 7 : Generalized Anxiety Score  Nervous, Anxious, on Edge 0  Control/stop worrying 0  Worry too much - different things 0  Trouble relaxing 0  Restless 0  Easily annoyed or irritable 0  Afraid - awful might happen 0  Total GAD 7 Score 0  Anxiety Difficulty Not difficult at all     Review of Systems:   Pertinent items are noted in HPI Denies any urinary or bowel changes.  Denies vaginal bleeding.   Pertinent History Reviewed:  Reviewed past medical,surgical, social and family history.  Reviewed problem list, medications and  allergies. Physical Assessment:   Vitals:   11/20/24 1009  BP: 133/70  Pulse: 75  SpO2: 100%  Weight: 177 lb 12.8 oz (80.6 kg)  Height: 5' 3.5 (1.613 m)  Body mass index is 31 kg/m.        Physical Examination:   General appearance - well appearing, and in no distress  Mental status - alert, oriented to person, place, and time  Psych:  She has a normal mood and affect  Skin - warm and dry, normal color, no suspicious lesions noted  Chest - effort normal, all lung fields clear to auscultation bilaterally  Heart - normal rate and regular rhythm  Neck:  midline trachea, no thyromegaly or nodules  Breasts - breasts appear normal, no suspicious masses, no skin or nipple changes or  axillary nodes  Abdomen - soft, nontender, nondistended, no masses or organomegaly  Pelvic - VULVA: normal appearing vulva with no masses, tenderness or lesions   VAGINA: normal appearing vagina with normal color and discharge, no lesions   CERVIX: normal appearing cervix without discharge or lesions, no CMT  Thin prep pap is not indciated  UTERUS: uterus  is felt to be normal size, shape, consistency and nontender   ADNEXA: No adnexal masses or tenderness noted.  Rectal - normal rectal, good sphincter tone, no masses felt  Extremities:  No swelling or varicosities noted  Chaperone present for exam  No results found for this or any previous visit (from the past 24 hours).  Assessment & Plan:  1. Encounter for routine gynecologic examination in Medicare patient (Primary) - Pap smear updated today.  Pt desires to continue pap smears. - Mammogram 02/28/2024 - Colonoscopy 12/28/2017 - Bone mineral density 08/2023.  T score -2.0 - lab work done with PCP, [name] - vaccines reviewed/updated  2. Osteopenia of both hips - reviewed with pt today.  Follow up timing discussed.   - F/u timing discussed  3. Vitamin D  deficiency - being followed by Dr Pinal  4. Postmenopausal - not on HRT  5.  Cervical cancer screening - Cytology - PAP( [place]) - PR OBTAINING SCREEN PAP SMEAR   Orders Placed This Encounter  Procedures   PR OBTAINING SCREEN PAP SMEAR    Meds: No orders of the defined types were placed in this encounter.   Follow-up: No follow-ups on file.  [name], MD 11/23/2024 8:12 PM "

## 2024-11-20 ENCOUNTER — Other Ambulatory Visit (HOSPITAL_COMMUNITY)
Admission: RE | Admit: 2024-11-20 | Discharge: 2024-11-20 | Disposition: A | Source: Ambulatory Visit | Attending: Obstetrics & Gynecology | Admitting: Obstetrics & Gynecology

## 2024-11-20 ENCOUNTER — Encounter (HOSPITAL_BASED_OUTPATIENT_CLINIC_OR_DEPARTMENT_OTHER): Payer: Self-pay | Admitting: Obstetrics & Gynecology

## 2024-11-20 ENCOUNTER — Ambulatory Visit (HOSPITAL_BASED_OUTPATIENT_CLINIC_OR_DEPARTMENT_OTHER): Admitting: Obstetrics & Gynecology

## 2024-11-20 VITALS — BP 133/70 | HR 75 | Ht 63.5 in | Wt 177.8 lb

## 2024-11-20 DIAGNOSIS — E559 Vitamin D deficiency, unspecified: Secondary | ICD-10-CM

## 2024-11-20 DIAGNOSIS — Z780 Asymptomatic menopausal state: Secondary | ICD-10-CM | POA: Diagnosis not present

## 2024-11-20 DIAGNOSIS — M85851 Other specified disorders of bone density and structure, right thigh: Secondary | ICD-10-CM | POA: Diagnosis not present

## 2024-11-20 DIAGNOSIS — Z01419 Encounter for gynecological examination (general) (routine) without abnormal findings: Secondary | ICD-10-CM | POA: Diagnosis not present

## 2024-11-20 DIAGNOSIS — Z124 Encounter for screening for malignant neoplasm of cervix: Secondary | ICD-10-CM

## 2024-11-20 DIAGNOSIS — M85852 Other specified disorders of bone density and structure, left thigh: Secondary | ICD-10-CM

## 2024-11-20 DIAGNOSIS — Z1331 Encounter for screening for depression: Secondary | ICD-10-CM | POA: Diagnosis not present

## 2024-11-22 LAB — CYTOLOGY - PAP: Diagnosis: NEGATIVE

## 2024-11-23 ENCOUNTER — Ambulatory Visit (HOSPITAL_BASED_OUTPATIENT_CLINIC_OR_DEPARTMENT_OTHER): Payer: Self-pay | Admitting: Obstetrics & Gynecology

## 2024-12-10 ENCOUNTER — Ambulatory Visit: Admitting: Family Medicine
